# Patient Record
Sex: Female | Born: 1950
Health system: Southern US, Community
[De-identification: ages and names within clinical notes are randomized; demographics above are authoritative.]

## PROBLEM LIST (undated history)

## (undated) DIAGNOSIS — K219 Gastro-esophageal reflux disease without esophagitis: Secondary | ICD-10-CM

## (undated) DIAGNOSIS — F419 Anxiety disorder, unspecified: Secondary | ICD-10-CM

## (undated) DIAGNOSIS — L309 Dermatitis, unspecified: Secondary | ICD-10-CM

## (undated) DIAGNOSIS — D689 Coagulation defect, unspecified: Secondary | ICD-10-CM

## (undated) DIAGNOSIS — I10 Essential (primary) hypertension: Secondary | ICD-10-CM

## (undated) DIAGNOSIS — C439 Malignant melanoma of skin, unspecified: Secondary | ICD-10-CM

## (undated) DIAGNOSIS — Z9221 Personal history of antineoplastic chemotherapy: Secondary | ICD-10-CM

## (undated) DIAGNOSIS — E785 Hyperlipidemia, unspecified: Secondary | ICD-10-CM

## (undated) DIAGNOSIS — C8590 Non-Hodgkin lymphoma, unspecified, unspecified site: Secondary | ICD-10-CM

## (undated) DIAGNOSIS — F32A Depression, unspecified: Secondary | ICD-10-CM

## (undated) DIAGNOSIS — I2510 Atherosclerotic heart disease of native coronary artery without angina pectoris: Secondary | ICD-10-CM

## (undated) HISTORY — DX: Anxiety disorder, unspecified: F41.9

## (undated) HISTORY — DX: Hyperlipidemia, unspecified: E78.5

## (undated) HISTORY — DX: Atherosclerotic heart disease of native coronary artery without angina pectoris: I25.10

## (undated) HISTORY — DX: Malignant melanoma of skin, unspecified: C43.9

## (undated) HISTORY — DX: Non-Hodgkin lymphoma, unspecified, unspecified site: C85.90

## (undated) HISTORY — PX: HERNIA REPAIR: SHX51

## (undated) HISTORY — PX: OTHER SURGICAL HISTORY: SHX169

## (undated) HISTORY — DX: Coagulation defect, unspecified: D68.9

## (undated) HISTORY — PX: FACIAL COSMETIC SURGERY: SHX629

## (undated) HISTORY — DX: Dermatitis, unspecified: L30.9

## (undated) HISTORY — PX: CARDIAC CATHETERIZATION: SHX172

---

## 2001-10-05 HISTORY — PX: AUGMENTATION MAMMAPLASTY: SUR837

## 2004-04-30 ENCOUNTER — Other Ambulatory Visit: Payer: Self-pay

## 2004-07-16 ENCOUNTER — Ambulatory Visit: Payer: Self-pay | Admitting: Family Medicine

## 2005-07-22 ENCOUNTER — Ambulatory Visit: Payer: Self-pay | Admitting: Family Medicine

## 2006-07-28 ENCOUNTER — Ambulatory Visit: Payer: Self-pay | Admitting: Unknown Physician Specialty

## 2006-12-22 DIAGNOSIS — D039 Melanoma in situ, unspecified: Secondary | ICD-10-CM

## 2006-12-22 HISTORY — DX: Melanoma in situ, unspecified: D03.9

## 2006-12-31 ENCOUNTER — Ambulatory Visit: Payer: Self-pay | Admitting: Internal Medicine

## 2007-02-02 DIAGNOSIS — D239 Other benign neoplasm of skin, unspecified: Secondary | ICD-10-CM

## 2007-02-02 HISTORY — DX: Other benign neoplasm of skin, unspecified: D23.9

## 2007-08-03 ENCOUNTER — Ambulatory Visit: Payer: Self-pay | Admitting: Unknown Physician Specialty

## 2008-03-07 DIAGNOSIS — C4491 Basal cell carcinoma of skin, unspecified: Secondary | ICD-10-CM

## 2008-03-07 HISTORY — DX: Basal cell carcinoma of skin, unspecified: C44.91

## 2008-08-08 ENCOUNTER — Ambulatory Visit: Payer: Self-pay | Admitting: Unknown Physician Specialty

## 2009-10-02 ENCOUNTER — Ambulatory Visit: Payer: Self-pay | Admitting: Unknown Physician Specialty

## 2010-10-09 ENCOUNTER — Ambulatory Visit: Payer: Self-pay | Admitting: Unknown Physician Specialty

## 2011-10-14 ENCOUNTER — Ambulatory Visit: Payer: Self-pay | Admitting: Internal Medicine

## 2012-06-29 ENCOUNTER — Ambulatory Visit: Payer: Self-pay | Admitting: Gastroenterology

## 2012-06-29 HISTORY — PX: COLONOSCOPY: SHX174

## 2012-06-29 LAB — HM COLONOSCOPY

## 2012-11-16 ENCOUNTER — Ambulatory Visit: Payer: Self-pay | Admitting: Internal Medicine

## 2013-10-05 DIAGNOSIS — C8590 Non-Hodgkin lymphoma, unspecified, unspecified site: Secondary | ICD-10-CM

## 2013-10-05 HISTORY — DX: Non-Hodgkin lymphoma, unspecified, unspecified site: C85.90

## 2013-12-13 ENCOUNTER — Ambulatory Visit: Payer: Self-pay | Admitting: Internal Medicine

## 2014-06-12 ENCOUNTER — Ambulatory Visit: Payer: Self-pay | Admitting: Internal Medicine

## 2014-06-13 ENCOUNTER — Ambulatory Visit: Payer: Self-pay | Admitting: Oncology

## 2014-06-13 ENCOUNTER — Ambulatory Visit: Payer: Self-pay | Admitting: Internal Medicine

## 2014-06-19 ENCOUNTER — Ambulatory Visit: Payer: Self-pay | Admitting: Oncology

## 2014-07-02 ENCOUNTER — Ambulatory Visit: Payer: Self-pay | Admitting: Vascular Surgery

## 2014-07-03 LAB — CBC CANCER CENTER
Bands: 1 %
HCT: 38.7 % (ref 35.0–47.0)
HGB: 12.5 g/dL (ref 12.0–16.0)
LYMPHS PCT: 5 %
MCH: 27.8 pg (ref 26.0–34.0)
MCHC: 32.3 g/dL (ref 32.0–36.0)
MCV: 86 fL (ref 80–100)
Monocytes: 13 %
PLATELETS: 180 x10 3/mm (ref 150–440)
RBC: 4.51 10*6/uL (ref 3.80–5.20)
RDW: 14.4 % (ref 11.5–14.5)
SEGMENTED NEUTROPHILS: 79 %
VARIANT LYMPHOCYTE - H4-RLYMPH: 2 %
WBC: 12 x10 3/mm — AB (ref 3.6–11.0)

## 2014-07-04 LAB — COMPREHENSIVE METABOLIC PANEL
ALK PHOS: 70 U/L
Albumin: 3 g/dL — ABNORMAL LOW (ref 3.4–5.0)
Anion Gap: 9 (ref 7–16)
BUN: 11 mg/dL (ref 7–18)
Bilirubin,Total: 0.4 mg/dL (ref 0.2–1.0)
CALCIUM: 9.1 mg/dL (ref 8.5–10.1)
CO2: 26 mmol/L (ref 21–32)
Chloride: 101 mmol/L (ref 98–107)
Creatinine: 0.84 mg/dL (ref 0.60–1.30)
EGFR (African American): >60
EGFR (Non-African Amer.): >60
Glucose: 91 mg/dL (ref 65–99)
Osmolality: 271 (ref 275–301)
POTASSIUM: 3.7 mmol/L (ref 3.5–5.1)
SGOT(AST): 37 U/L (ref 15–37)
SGPT (ALT): 24 U/L
Sodium: 136 mmol/L (ref 136–145)
Total Protein: 6.7 g/dL (ref 6.4–8.2)

## 2014-07-05 ENCOUNTER — Ambulatory Visit: Payer: Self-pay | Admitting: Oncology

## 2014-07-11 LAB — CBC CANCER CENTER
Basophil #: 0 x10 3/mm (ref 0.0–0.1)
Basophil %: 0.2 %
Eosinophil #: 0 x10 3/mm (ref 0.0–0.7)
Eosinophil %: 5.4 %
HCT: 35.9 % (ref 35.0–47.0)
HGB: 11.9 g/dL — AB (ref 12.0–16.0)
LYMPHS ABS: 0.5 x10 3/mm — AB (ref 1.0–3.6)
Lymphocyte %: 68 %
MCH: 28.2 pg (ref 26.0–34.0)
MCHC: 33.2 g/dL (ref 32.0–36.0)
MCV: 85 fL (ref 80–100)
MONOS PCT: 21.7 %
Monocyte #: 0.1 x10 3/mm — ABNORMAL LOW (ref 0.2–0.9)
NEUTROS ABS: 0 x10 3/mm — AB (ref 1.4–6.5)
Neutrophil %: 4.7 %
Platelet: 140 x10 3/mm — ABNORMAL LOW (ref 150–440)
RBC: 4.22 10*6/uL (ref 3.80–5.20)
RDW: 14.4 % (ref 11.5–14.5)
WBC: 0.7 x10 3/mm — AB (ref 3.6–11.0)

## 2014-07-16 LAB — PATHOLOGY REPORT

## 2014-07-18 LAB — CBC CANCER CENTER
BASOS ABS: 0.1 x10 3/mm (ref 0.0–0.1)
Basophil %: 0.5 %
Eosinophil #: 0 x10 3/mm (ref 0.0–0.7)
Eosinophil %: 0.1 %
HCT: 33.5 % — AB (ref 35.0–47.0)
HGB: 11.1 g/dL — ABNORMAL LOW (ref 12.0–16.0)
LYMPHS PCT: 7.7 %
Lymphocyte #: 1.2 x10 3/mm (ref 1.0–3.6)
MCH: 28.4 pg (ref 26.0–34.0)
MCHC: 33.3 g/dL (ref 32.0–36.0)
MCV: 85 fL (ref 80–100)
MONO ABS: 1.4 x10 3/mm — AB (ref 0.2–0.9)
Monocyte %: 9.2 %
NEUTROS ABS: 12.5 x10 3/mm — AB (ref 1.4–6.5)
NEUTROS PCT: 82.5 %
Platelet: 179 x10 3/mm (ref 150–440)
RBC: 3.93 10*6/uL (ref 3.80–5.20)
RDW: 15.2 % — ABNORMAL HIGH (ref 11.5–14.5)
WBC: 15.2 x10 3/mm — ABNORMAL HIGH (ref 3.6–11.0)

## 2014-07-24 LAB — CBC CANCER CENTER
Basophil #: 0.1 x10 3/mm (ref 0.0–0.1)
Basophil %: 1 %
Eosinophil #: 0 x10 3/mm (ref 0.0–0.7)
Eosinophil %: 0.1 %
HCT: 34.4 % — ABNORMAL LOW (ref 35.0–47.0)
HGB: 11.1 g/dL — AB (ref 12.0–16.0)
Lymphocyte #: 0.7 x10 3/mm — ABNORMAL LOW (ref 1.0–3.6)
Lymphocyte %: 6.5 %
MCH: 27.8 pg (ref 26.0–34.0)
MCHC: 32.2 g/dL (ref 32.0–36.0)
MCV: 86 fL (ref 80–100)
MONO ABS: 1 x10 3/mm — AB (ref 0.2–0.9)
MONOS PCT: 8.5 %
Neutrophil #: 9.6 x10 3/mm — ABNORMAL HIGH (ref 1.4–6.5)
Neutrophil %: 83.9 %
PLATELETS: 185 x10 3/mm (ref 150–440)
RBC: 3.98 10*6/uL (ref 3.80–5.20)
RDW: 16 % — AB (ref 11.5–14.5)
WBC: 11.4 x10 3/mm — ABNORMAL HIGH (ref 3.6–11.0)

## 2014-07-24 LAB — COMPREHENSIVE METABOLIC PANEL
ALK PHOS: 61 U/L
ALT: 17 U/L
Albumin: 2.9 g/dL — ABNORMAL LOW (ref 3.4–5.0)
Anion Gap: 6 — ABNORMAL LOW (ref 7–16)
BUN: 13 mg/dL (ref 7–18)
Bilirubin,Total: 0.3 mg/dL (ref 0.2–1.0)
CALCIUM: 8.1 mg/dL — AB (ref 8.5–10.1)
CO2: 26 mmol/L (ref 21–32)
Chloride: 106 mmol/L (ref 98–107)
Creatinine: 0.75 mg/dL (ref 0.60–1.30)
EGFR (African American): >60
EGFR (Non-African Amer.): >60
Glucose: 76 mg/dL (ref 65–99)
Osmolality: 275 (ref 275–301)
POTASSIUM: 4 mmol/L (ref 3.5–5.1)
SGOT(AST): 13 U/L — ABNORMAL LOW (ref 15–37)
SODIUM: 138 mmol/L (ref 136–145)
Total Protein: 6.6 g/dL (ref 6.4–8.2)

## 2014-08-01 LAB — CBC CANCER CENTER
BASOS PCT: 0.5 %
Basophil #: 0 x10 3/mm (ref 0.0–0.1)
EOS PCT: 1 %
Eosinophil #: 0 x10 3/mm (ref 0.0–0.7)
HCT: 30.9 % — AB (ref 35.0–47.0)
HGB: 10.4 g/dL — ABNORMAL LOW (ref 12.0–16.0)
LYMPHS PCT: 31.6 %
Lymphocyte #: 0.6 x10 3/mm — ABNORMAL LOW (ref 1.0–3.6)
MCH: 28.6 pg (ref 26.0–34.0)
MCHC: 33.7 g/dL (ref 32.0–36.0)
MCV: 85 fL (ref 80–100)
Monocyte #: 0.7 x10 3/mm (ref 0.2–0.9)
Monocyte %: 34.9 %
NEUTROS ABS: 0.6 x10 3/mm — AB (ref 1.4–6.5)
Neutrophil %: 32 %
Platelet: 163 x10 3/mm (ref 150–440)
RBC: 3.64 10*6/uL — ABNORMAL LOW (ref 3.80–5.20)
RDW: 16.4 % — ABNORMAL HIGH (ref 11.5–14.5)
WBC: 1.9 x10 3/mm — CL (ref 3.6–11.0)

## 2014-08-05 ENCOUNTER — Ambulatory Visit: Payer: Self-pay | Admitting: Oncology

## 2014-08-07 LAB — CBC CANCER CENTER
BASOS PCT: 0.5 %
Basophil #: 0.1 x10 3/mm (ref 0.0–0.1)
Eosinophil #: 0 x10 3/mm (ref 0.0–0.7)
Eosinophil %: 0.1 %
HCT: 33.8 % — ABNORMAL LOW (ref 35.0–47.0)
HGB: 11 g/dL — AB (ref 12.0–16.0)
LYMPHS PCT: 6.1 %
Lymphocyte #: 1 x10 3/mm (ref 1.0–3.6)
MCH: 27.9 pg (ref 26.0–34.0)
MCHC: 32.5 g/dL (ref 32.0–36.0)
MCV: 86 fL (ref 80–100)
Monocyte #: 1.5 x10 3/mm — ABNORMAL HIGH (ref 0.2–0.9)
Monocyte %: 9.2 %
Neutrophil #: 13.3 x10 3/mm — ABNORMAL HIGH (ref 1.4–6.5)
Neutrophil %: 84.1 %
Platelet: 166 x10 3/mm (ref 150–440)
RBC: 3.94 10*6/uL (ref 3.80–5.20)
RDW: 17.2 % — ABNORMAL HIGH (ref 11.5–14.5)
WBC: 15.8 x10 3/mm — ABNORMAL HIGH (ref 3.6–11.0)

## 2014-08-14 LAB — CBC CANCER CENTER
BASOS PCT: 1.1 %
Basophil #: 0.1 x10 3/mm (ref 0.0–0.1)
EOS ABS: 0 x10 3/mm (ref 0.0–0.7)
EOS PCT: 0.1 %
HCT: 33.9 % — ABNORMAL LOW (ref 35.0–47.0)
HGB: 11 g/dL — AB (ref 12.0–16.0)
Lymphocyte #: 0.7 x10 3/mm — ABNORMAL LOW (ref 1.0–3.6)
Lymphocyte %: 6.2 %
MCH: 28.1 pg (ref 26.0–34.0)
MCHC: 32.6 g/dL (ref 32.0–36.0)
MCV: 86 fL (ref 80–100)
MONO ABS: 1.4 x10 3/mm — AB (ref 0.2–0.9)
Monocyte %: 11.9 %
Neutrophil #: 9.3 x10 3/mm — ABNORMAL HIGH (ref 1.4–6.5)
Neutrophil %: 80.7 %
PLATELETS: 194 x10 3/mm (ref 150–440)
RBC: 3.93 10*6/uL (ref 3.80–5.20)
RDW: 18.6 % — ABNORMAL HIGH (ref 11.5–14.5)
WBC: 11.5 x10 3/mm — ABNORMAL HIGH (ref 3.6–11.0)

## 2014-08-14 LAB — COMPREHENSIVE METABOLIC PANEL
ANION GAP: 9 (ref 7–16)
Albumin: 3.1 g/dL — ABNORMAL LOW (ref 3.4–5.0)
Alkaline Phosphatase: 58 U/L
BUN: 13 mg/dL (ref 7–18)
Bilirubin,Total: 0.3 mg/dL (ref 0.2–1.0)
CHLORIDE: 103 mmol/L (ref 98–107)
CREATININE: 0.79 mg/dL (ref 0.60–1.30)
Calcium, Total: 8.6 mg/dL (ref 8.5–10.1)
Co2: 26 mmol/L (ref 21–32)
EGFR (African American): >60
EGFR (Non-African Amer.): >60
Glucose: 85 mg/dL (ref 65–99)
Osmolality: 275 (ref 275–301)
POTASSIUM: 3.7 mmol/L (ref 3.5–5.1)
SGOT(AST): 13 U/L — ABNORMAL LOW (ref 15–37)
SGPT (ALT): 19 U/L
Sodium: 138 mmol/L (ref 136–145)
Total Protein: 6.6 g/dL (ref 6.4–8.2)

## 2014-08-21 LAB — CBC CANCER CENTER
Basophil #: 0 x10 3/mm (ref 0.0–0.1)
Basophil %: 1 %
EOS PCT: 1.6 %
Eosinophil #: 0 x10 3/mm (ref 0.0–0.7)
HCT: 31.2 % — ABNORMAL LOW (ref 35.0–47.0)
HGB: 10.2 g/dL — ABNORMAL LOW (ref 12.0–16.0)
LYMPHS ABS: 0.4 x10 3/mm — AB (ref 1.0–3.6)
Lymphocyte %: 15.4 %
MCH: 28.2 pg (ref 26.0–34.0)
MCHC: 32.6 g/dL (ref 32.0–36.0)
MCV: 86 fL (ref 80–100)
Monocyte #: 0.3 x10 3/mm (ref 0.2–0.9)
Monocyte %: 13.9 %
NEUTROS PCT: 68.1 %
Neutrophil #: 1.6 x10 3/mm (ref 1.4–6.5)
PLATELETS: 119 x10 3/mm — AB (ref 150–440)
RBC: 3.61 10*6/uL — ABNORMAL LOW (ref 3.80–5.20)
RDW: 18.3 % — ABNORMAL HIGH (ref 11.5–14.5)
WBC: 2.4 x10 3/mm — ABNORMAL LOW (ref 3.6–11.0)

## 2014-08-28 LAB — CBC CANCER CENTER
Basophil #: 0.1 x10 3/mm (ref 0.0–0.1)
Basophil %: 0.9 %
EOS ABS: 0 x10 3/mm (ref 0.0–0.7)
Eosinophil %: 0 %
HCT: 34.7 % — ABNORMAL LOW (ref 35.0–47.0)
HGB: 11.4 g/dL — ABNORMAL LOW (ref 12.0–16.0)
Lymphocyte #: 0.8 x10 3/mm — ABNORMAL LOW (ref 1.0–3.6)
Lymphocyte %: 5.6 %
MCH: 28.4 pg (ref 26.0–34.0)
MCHC: 32.7 g/dL (ref 32.0–36.0)
MCV: 87 fL (ref 80–100)
MONO ABS: 1.3 x10 3/mm — AB (ref 0.2–0.9)
MONOS PCT: 9.7 %
Neutrophil #: 11.2 x10 3/mm — ABNORMAL HIGH (ref 1.4–6.5)
Neutrophil %: 83.8 %
Platelet: 177 x10 3/mm (ref 150–440)
RBC: 3.99 10*6/uL (ref 3.80–5.20)
RDW: 18.9 % — ABNORMAL HIGH (ref 11.5–14.5)
WBC: 13.4 x10 3/mm — ABNORMAL HIGH (ref 3.6–11.0)

## 2014-09-04 ENCOUNTER — Ambulatory Visit: Payer: Self-pay | Admitting: Oncology

## 2014-09-04 LAB — COMPREHENSIVE METABOLIC PANEL
ANION GAP: 9 (ref 7–16)
AST: 12 U/L — AB (ref 15–37)
Albumin: 3 g/dL — ABNORMAL LOW (ref 3.4–5.0)
Alkaline Phosphatase: 54 U/L
BILIRUBIN TOTAL: 0.3 mg/dL (ref 0.2–1.0)
BUN: 13 mg/dL (ref 7–18)
CO2: 26 mmol/L (ref 21–32)
Calcium, Total: 8.1 mg/dL — ABNORMAL LOW (ref 8.5–10.1)
Chloride: 104 mmol/L (ref 98–107)
Creatinine: 0.72 mg/dL (ref 0.60–1.30)
EGFR (African American): >60
Glucose: 95 mg/dL (ref 65–99)
Osmolality: 277 (ref 275–301)
Potassium: 3.5 mmol/L (ref 3.5–5.1)
SGPT (ALT): 17 U/L
Sodium: 139 mmol/L (ref 136–145)
Total Protein: 6.2 g/dL — ABNORMAL LOW (ref 6.4–8.2)

## 2014-09-04 LAB — CBC CANCER CENTER
Basophil #: 0.1 x10 3/mm (ref 0.0–0.1)
Basophil %: 0.8 %
EOS ABS: 0 x10 3/mm (ref 0.0–0.7)
Eosinophil %: 0 %
HCT: 32.1 % — ABNORMAL LOW (ref 35.0–47.0)
HGB: 10.8 g/dL — ABNORMAL LOW (ref 12.0–16.0)
Lymphocyte #: 0.6 x10 3/mm — ABNORMAL LOW (ref 1.0–3.6)
Lymphocyte %: 5.4 %
MCH: 29.2 pg (ref 26.0–34.0)
MCHC: 33.8 g/dL (ref 32.0–36.0)
MCV: 86 fL (ref 80–100)
MONOS PCT: 10.2 %
Monocyte #: 1.1 x10 3/mm — ABNORMAL HIGH (ref 0.2–0.9)
NEUTROS ABS: 9.1 x10 3/mm — AB (ref 1.4–6.5)
Neutrophil %: 83.6 %
Platelet: 163 x10 3/mm (ref 150–440)
RBC: 3.71 10*6/uL — AB (ref 3.80–5.20)
RDW: 19.2 % — ABNORMAL HIGH (ref 11.5–14.5)
WBC: 10.9 x10 3/mm (ref 3.6–11.0)

## 2014-09-11 LAB — CBC CANCER CENTER
Basophil #: 0 x10 3/mm (ref 0.0–0.1)
Basophil %: 0.5 %
Eosinophil #: 0 x10 3/mm (ref 0.0–0.7)
Eosinophil %: 1.4 %
HCT: 30.9 % — AB (ref 35.0–47.0)
HGB: 10.1 g/dL — AB (ref 12.0–16.0)
Lymphocyte #: 0.3 x10 3/mm — ABNORMAL LOW (ref 1.0–3.6)
Lymphocyte %: 12 %
MCH: 28.8 pg (ref 26.0–34.0)
MCHC: 32.9 g/dL (ref 32.0–36.0)
MCV: 88 fL (ref 80–100)
MONOS PCT: 13.6 %
Monocyte #: 0.4 x10 3/mm (ref 0.2–0.9)
NEUTROS ABS: 2 x10 3/mm (ref 1.4–6.5)
Neutrophil %: 72.5 %
Platelet: 97 x10 3/mm — ABNORMAL LOW (ref 150–440)
RBC: 3.53 10*6/uL — AB (ref 3.80–5.20)
RDW: 19.3 % — AB (ref 11.5–14.5)
WBC: 2.7 x10 3/mm — AB (ref 3.6–11.0)

## 2014-09-18 LAB — CBC CANCER CENTER
Basophil #: 0.1 x10 3/mm (ref 0.0–0.1)
Basophil %: 0.5 %
EOS ABS: 0 x10 3/mm (ref 0.0–0.7)
Eosinophil %: 0 %
HCT: 33.2 % — ABNORMAL LOW (ref 35.0–47.0)
HGB: 11 g/dL — AB (ref 12.0–16.0)
LYMPHS PCT: 6.7 %
Lymphocyte #: 0.9 x10 3/mm — ABNORMAL LOW (ref 1.0–3.6)
MCH: 28.9 pg (ref 26.0–34.0)
MCHC: 33 g/dL (ref 32.0–36.0)
MCV: 88 fL (ref 80–100)
MONO ABS: 1.4 x10 3/mm — AB (ref 0.2–0.9)
Monocyte %: 11.2 %
Neutrophil #: 10.4 x10 3/mm — ABNORMAL HIGH (ref 1.4–6.5)
Neutrophil %: 81.6 %
PLATELETS: 154 x10 3/mm (ref 150–440)
RBC: 3.79 10*6/uL — ABNORMAL LOW (ref 3.80–5.20)
RDW: 18.9 % — ABNORMAL HIGH (ref 11.5–14.5)
WBC: 12.7 x10 3/mm — AB (ref 3.6–11.0)

## 2014-09-24 ENCOUNTER — Ambulatory Visit: Payer: Self-pay | Admitting: Oncology

## 2014-09-25 LAB — CBC CANCER CENTER
BASOS ABS: 0.1 x10 3/mm (ref 0.0–0.1)
Basophil %: 0.8 %
EOS PCT: 0.1 %
Eosinophil #: 0 x10 3/mm (ref 0.0–0.7)
HCT: 32.3 % — AB (ref 35.0–47.0)
HGB: 10.8 g/dL — ABNORMAL LOW (ref 12.0–16.0)
LYMPHS ABS: 0.8 x10 3/mm — AB (ref 1.0–3.6)
LYMPHS PCT: 7.6 %
MCH: 29.1 pg (ref 26.0–34.0)
MCHC: 33.5 g/dL (ref 32.0–36.0)
MCV: 87 fL (ref 80–100)
MONO ABS: 1.2 x10 3/mm — AB (ref 0.2–0.9)
Monocyte %: 10.9 %
NEUTROS PCT: 80.6 %
Neutrophil #: 8.5 x10 3/mm — ABNORMAL HIGH (ref 1.4–6.5)
PLATELETS: 172 x10 3/mm (ref 150–440)
RBC: 3.72 10*6/uL — AB (ref 3.80–5.20)
RDW: 18.8 % — ABNORMAL HIGH (ref 11.5–14.5)
WBC: 10.5 x10 3/mm (ref 3.6–11.0)

## 2014-09-25 LAB — COMPREHENSIVE METABOLIC PANEL
ALBUMIN: 3.1 g/dL — AB (ref 3.4–5.0)
AST: 11 U/L — AB (ref 15–37)
Alkaline Phosphatase: 56 U/L
Anion Gap: 10 (ref 7–16)
BUN: 11 mg/dL (ref 7–18)
Bilirubin,Total: 0.3 mg/dL (ref 0.2–1.0)
CHLORIDE: 104 mmol/L (ref 98–107)
CO2: 25 mmol/L (ref 21–32)
CREATININE: 0.75 mg/dL (ref 0.60–1.30)
Calcium, Total: 7.8 mg/dL — ABNORMAL LOW (ref 8.5–10.1)
Glucose: 106 mg/dL — ABNORMAL HIGH (ref 65–99)
Osmolality: 277 (ref 275–301)
Potassium: 3.5 mmol/L (ref 3.5–5.1)
SGPT (ALT): 16 U/L
Sodium: 139 mmol/L (ref 136–145)
Total Protein: 6.2 g/dL — ABNORMAL LOW (ref 6.4–8.2)

## 2014-09-25 LAB — LACTATE DEHYDROGENASE: LDH: 151 U/L (ref 81–246)

## 2014-10-03 LAB — CBC CANCER CENTER
BASOS PCT: 1.9 %
Basophil #: 0 x10 3/mm (ref 0.0–0.1)
EOS ABS: 0 x10 3/mm (ref 0.0–0.7)
Eosinophil %: 1.7 %
HCT: 32 % — ABNORMAL LOW (ref 35.0–47.0)
HGB: 10.6 g/dL — ABNORMAL LOW (ref 12.0–16.0)
LYMPHS ABS: 0.6 x10 3/mm — AB (ref 1.0–3.6)
Lymphocyte %: 32.2 %
MCH: 29.2 pg (ref 26.0–34.0)
MCHC: 33.3 g/dL (ref 32.0–36.0)
MCV: 88 fL (ref 80–100)
Monocyte #: 0.8 x10 3/mm (ref 0.2–0.9)
Monocyte %: 43.9 %
NEUTROS ABS: 0.4 x10 3/mm — AB (ref 1.4–6.5)
Neutrophil %: 20.3 %
PLATELETS: 112 x10 3/mm — AB (ref 150–440)
RBC: 3.63 10*6/uL — AB (ref 3.80–5.20)
RDW: 17.9 % — ABNORMAL HIGH (ref 11.5–14.5)
WBC: 1.8 x10 3/mm — AB (ref 3.6–11.0)

## 2014-10-05 ENCOUNTER — Ambulatory Visit: Payer: Self-pay | Admitting: Oncology

## 2014-10-09 LAB — CBC CANCER CENTER
BASOS ABS: 0.1 x10 3/mm (ref 0.0–0.1)
Basophil %: 0.9 %
EOS ABS: 0 x10 3/mm (ref 0.0–0.7)
Eosinophil %: 0.2 %
HCT: 34 % — ABNORMAL LOW (ref 35.0–47.0)
HGB: 11.4 g/dL — ABNORMAL LOW (ref 12.0–16.0)
Lymphocyte #: 0.9 x10 3/mm — ABNORMAL LOW (ref 1.0–3.6)
Lymphocyte %: 7.7 %
MCH: 29.5 pg (ref 26.0–34.0)
MCHC: 33.5 g/dL (ref 32.0–36.0)
MCV: 88 fL (ref 80–100)
Monocyte #: 1.4 x10 3/mm — ABNORMAL HIGH (ref 0.2–0.9)
Monocyte %: 12.3 %
NEUTROS PCT: 78.9 %
Neutrophil #: 9.1 x10 3/mm — ABNORMAL HIGH (ref 1.4–6.5)
Platelet: 180 x10 3/mm (ref 150–440)
RBC: 3.87 10*6/uL (ref 3.80–5.20)
RDW: 17.5 % — AB (ref 11.5–14.5)
WBC: 11.6 x10 3/mm — ABNORMAL HIGH (ref 3.6–11.0)

## 2014-10-16 LAB — MAGNESIUM: Magnesium: 1.7 mg/dL — ABNORMAL LOW

## 2014-10-16 LAB — CBC CANCER CENTER
Basophil #: 0.1 x10 3/mm (ref 0.0–0.1)
Basophil %: 0.8 %
Eosinophil #: 0 x10 3/mm (ref 0.0–0.7)
Eosinophil %: 0.2 %
HCT: 32.1 % — AB (ref 35.0–47.0)
HGB: 10.8 g/dL — ABNORMAL LOW (ref 12.0–16.0)
Lymphocyte #: 0.9 x10 3/mm — ABNORMAL LOW (ref 1.0–3.6)
Lymphocyte %: 10.9 %
MCH: 29.9 pg (ref 26.0–34.0)
MCHC: 33.6 g/dL (ref 32.0–36.0)
MCV: 89 fL (ref 80–100)
Monocyte #: 1.2 x10 3/mm — ABNORMAL HIGH (ref 0.2–0.9)
Monocyte %: 14.7 %
NEUTROS ABS: 5.8 x10 3/mm (ref 1.4–6.5)
Neutrophil %: 73.4 %
Platelet: 163 x10 3/mm (ref 150–440)
RBC: 3.61 10*6/uL — ABNORMAL LOW (ref 3.80–5.20)
RDW: 17.4 % — ABNORMAL HIGH (ref 11.5–14.5)
WBC: 7.9 x10 3/mm (ref 3.6–11.0)

## 2014-10-16 LAB — COMPREHENSIVE METABOLIC PANEL
ANION GAP: 7 (ref 7–16)
Albumin: 3.1 g/dL — ABNORMAL LOW (ref 3.4–5.0)
Alkaline Phosphatase: 47 U/L
BILIRUBIN TOTAL: 0.3 mg/dL (ref 0.2–1.0)
BUN: 11 mg/dL (ref 7–18)
CHLORIDE: 107 mmol/L (ref 98–107)
CREATININE: 0.85 mg/dL (ref 0.60–1.30)
Calcium, Total: 8.1 mg/dL — ABNORMAL LOW (ref 8.5–10.1)
Co2: 25 mmol/L (ref 21–32)
EGFR (Non-African Amer.): >60
GLUCOSE: 80 mg/dL (ref 65–99)
Osmolality: 276 (ref 275–301)
POTASSIUM: 3.7 mmol/L (ref 3.5–5.1)
SGOT(AST): 23 U/L (ref 15–37)
SGPT (ALT): 18 U/L
SODIUM: 139 mmol/L (ref 136–145)
TOTAL PROTEIN: 6.4 g/dL (ref 6.4–8.2)

## 2014-10-16 LAB — CREATININE, SERUM: Creatine, Serum: 0.85

## 2014-10-23 LAB — CBC CANCER CENTER
BASOS ABS: 0 x10 3/mm (ref 0.0–0.1)
Basophil %: 0.2 %
EOS PCT: 2.2 %
Eosinophil #: 0 x10 3/mm (ref 0.0–0.7)
HCT: 32.7 % — ABNORMAL LOW (ref 35.0–47.0)
HGB: 11.1 g/dL — ABNORMAL LOW (ref 12.0–16.0)
LYMPHS ABS: 0.3 x10 3/mm — AB (ref 1.0–3.6)
Lymphocyte %: 15.5 %
MCH: 30 pg (ref 26.0–34.0)
MCHC: 34 g/dL (ref 32.0–36.0)
MCV: 88 fL (ref 80–100)
MONO ABS: 0.2 x10 3/mm (ref 0.2–0.9)
Monocyte %: 10 %
NEUTROS PCT: 72.1 %
Neutrophil #: 1.5 x10 3/mm (ref 1.4–6.5)
PLATELETS: 107 x10 3/mm — AB (ref 150–440)
RBC: 3.71 10*6/uL — AB (ref 3.80–5.20)
RDW: 16.5 % — AB (ref 11.5–14.5)
WBC: 2.1 x10 3/mm — ABNORMAL LOW (ref 3.6–11.0)

## 2014-10-30 LAB — CBC CANCER CENTER
Basophil #: 0.1 x10 3/mm (ref 0.0–0.1)
Basophil %: 1.1 %
EOS ABS: 0 x10 3/mm (ref 0.0–0.7)
Eosinophil %: 0.3 %
HCT: 32.8 % — ABNORMAL LOW (ref 35.0–47.0)
HGB: 11 g/dL — AB (ref 12.0–16.0)
Lymphocyte #: 0.7 x10 3/mm — ABNORMAL LOW (ref 1.0–3.6)
Lymphocyte %: 6.4 %
MCH: 29.6 pg (ref 26.0–34.0)
MCHC: 33.4 g/dL (ref 32.0–36.0)
MCV: 89 fL (ref 80–100)
MONO ABS: 1.3 x10 3/mm — AB (ref 0.2–0.9)
Monocyte %: 12.8 %
Neutrophil #: 8.1 x10 3/mm — ABNORMAL HIGH (ref 1.4–6.5)
Neutrophil %: 79.4 %
PLATELETS: 150 x10 3/mm (ref 150–440)
RBC: 3.71 10*6/uL — AB (ref 3.80–5.20)
RDW: 16.6 % — ABNORMAL HIGH (ref 11.5–14.5)
WBC: 10.2 x10 3/mm (ref 3.6–11.0)

## 2014-10-31 ENCOUNTER — Ambulatory Visit: Payer: Self-pay | Admitting: Oncology

## 2014-11-05 ENCOUNTER — Ambulatory Visit: Payer: Self-pay | Admitting: Oncology

## 2014-11-06 LAB — CBC CANCER CENTER
BASOS PCT: 1.4 %
Basophil #: 0.1 x10 3/mm (ref 0.0–0.1)
EOS ABS: 0.1 x10 3/mm (ref 0.0–0.7)
Eosinophil %: 1.5 %
HCT: 33.4 % — AB (ref 35.0–47.0)
HGB: 11.4 g/dL — ABNORMAL LOW (ref 12.0–16.0)
Lymphocyte #: 1 x10 3/mm (ref 1.0–3.6)
Lymphocyte %: 11.6 %
MCH: 29.4 pg (ref 26.0–34.0)
MCHC: 34.3 g/dL (ref 32.0–36.0)
MCV: 86 fL (ref 80–100)
MONOS PCT: 18.3 %
Monocyte #: 1.5 x10 3/mm — ABNORMAL HIGH (ref 0.2–0.9)
NEUTROS ABS: 5.7 x10 3/mm (ref 1.4–6.5)
Neutrophil %: 67.2 %
Platelet: 194 x10 3/mm (ref 150–440)
RBC: 3.88 10*6/uL (ref 3.80–5.20)
RDW: 16.7 % — ABNORMAL HIGH (ref 11.5–14.5)
WBC: 8.4 x10 3/mm (ref 3.6–11.0)

## 2014-12-04 ENCOUNTER — Ambulatory Visit
Admit: 2014-12-04 | Disposition: A | Discharge status: Home / Self Care | Payer: Self-pay | Attending: Oncology | Admitting: Oncology

## 2015-01-04 ENCOUNTER — Ambulatory Visit
Admit: 2015-01-04 | Disposition: A | Discharge status: Home / Self Care | Payer: Self-pay | Attending: Oncology | Admitting: Oncology

## 2015-01-26 NOTE — Op Note (Signed)
PATIENT NAME:  JORA, GALLUZZO MR#:  [id number] DATE OF BIRTH:  [birth year]  DATE OF PROCEDURE:  07/02/2014  PREOPERATIVE DIAGNOSIS: Melanoma.  POSTOPERATIVE DIAGNOSIS: Melanoma.  PROCEDURES:  1.  Ultrasound guidance for vascular access, right internal jugular vein.  2.  Fluoroscopic guidance for placement of catheter.  3.  Placement of CT compatible Port-A-Cath, right internal jugular vein.   SURGEON: Dr. Lucky Cowboy.   ANESTHESIA:  Local with moderate conscious sedation.   FLUOROSCOPY TIME:  Less than 1 minute.   CONTRAST:  Zero.   ESTIMATED BLOOD LOSS: Minimal.   INDICATION FOR PROCEDURE: A 64 year old with melanoma. She needs a Port-A-Cath for chemotherapy and durable venous access. Risks and benefits were discussed. Informed consent was obtained.   DESCRIPTION OF THE PROCEDURE:  The patient was brought to the vascular and interventional radiology suite. The right neck and chest were sterilely prepped and draped, and a sterile surgical field was created. Ultrasound was used to help visualize a patent right internal jugular vein. This was then accessed under direct ultrasound guidance without difficulty with a Seldinger needle and a permanent image was recorded. A J-wire was placed. After skin nick and dilatation, the peel-away sheath was then placed over the wire. I then anesthetized an area under the clavicle approximately 2 fingerbreadths. A transverse incision was created and an inferior pocket was created with electrocautery and blunt dissection. The port was then brought onto the field, placed into the pocket and secured to the chest wall with 2 Prolene sutures. The catheter was connected to the port and tunneled from the subclavicular incision to the access site. Fluoroscopic guidance was used to cut the catheter to an appropriate length. The catheter was then placed through the peel-away sheath and the peel-away sheath was removed. The catheter tip was parked in excellent location in the  cavoatrial junction. The pocket was then irrigated with antibiotic-impregnated saline and the wound was closed with a running 3-0 Vicryl and a 4-0 Monocryl. The access incision was closed with a single 4-0 Monocryl. The Huber needle was used to withdraw blood and flush the port with heparinized saline. Dermabond was then placed as a dressing. The patient tolerated the procedure well and was taken to the recovery room in stable condition.    ____________________________ Algernon Huxley, MD jsd:lt D: 07/02/2014 14:03:01 ET T: 07/02/2014 16:03:26 ET JOB#: [id number]  cc: Algernon Huxley, MD, <Dictator> Algernon Huxley MD ELECTRONICALLY SIGNED 07/17/2014 10:20

## 2015-01-30 ENCOUNTER — Other Ambulatory Visit: Payer: Self-pay | Admitting: *Deleted

## 2015-01-30 DIAGNOSIS — C8590 Non-Hodgkin lymphoma, unspecified, unspecified site: Secondary | ICD-10-CM

## 2015-02-05 ENCOUNTER — Inpatient Hospital Stay: Payer: 59 | Attending: Oncology

## 2015-02-05 ENCOUNTER — Inpatient Hospital Stay (HOSPITAL_BASED_OUTPATIENT_CLINIC_OR_DEPARTMENT_OTHER): Payer: 59 | Admitting: Oncology

## 2015-02-05 ENCOUNTER — Inpatient Hospital Stay: Payer: 59

## 2015-02-05 ENCOUNTER — Encounter: Payer: Self-pay | Admitting: Oncology

## 2015-02-05 VITALS — BP 146/82 | HR 72 | Temp 96.1°F | Wt 194.2 lb

## 2015-02-05 DIAGNOSIS — Z79899 Other long term (current) drug therapy: Secondary | ICD-10-CM | POA: Diagnosis not present

## 2015-02-05 DIAGNOSIS — C8299 Follicular lymphoma, unspecified, extranodal and solid organ sites: Secondary | ICD-10-CM

## 2015-02-05 DIAGNOSIS — I2510 Atherosclerotic heart disease of native coronary artery without angina pectoris: Secondary | ICD-10-CM

## 2015-02-05 DIAGNOSIS — R590 Localized enlarged lymph nodes: Secondary | ICD-10-CM

## 2015-02-05 DIAGNOSIS — L570 Actinic keratosis: Secondary | ICD-10-CM | POA: Diagnosis not present

## 2015-02-05 DIAGNOSIS — F419 Anxiety disorder, unspecified: Secondary | ICD-10-CM | POA: Insufficient documentation

## 2015-02-05 DIAGNOSIS — Z7902 Long term (current) use of antithrombotics/antiplatelets: Secondary | ICD-10-CM | POA: Diagnosis not present

## 2015-02-05 DIAGNOSIS — C8330 Diffuse large B-cell lymphoma, unspecified site: Secondary | ICD-10-CM

## 2015-02-05 DIAGNOSIS — C8338 Diffuse large B-cell lymphoma, lymph nodes of multiple sites: Secondary | ICD-10-CM | POA: Diagnosis not present

## 2015-02-05 DIAGNOSIS — R1310 Dysphagia, unspecified: Secondary | ICD-10-CM

## 2015-02-05 DIAGNOSIS — H9319 Tinnitus, unspecified ear: Secondary | ICD-10-CM

## 2015-02-05 DIAGNOSIS — E785 Hyperlipidemia, unspecified: Secondary | ICD-10-CM | POA: Diagnosis not present

## 2015-02-05 DIAGNOSIS — C4359 Malignant melanoma of other part of trunk: Secondary | ICD-10-CM | POA: Insufficient documentation

## 2015-02-05 DIAGNOSIS — Z85828 Personal history of other malignant neoplasm of skin: Secondary | ICD-10-CM

## 2015-02-05 DIAGNOSIS — C8334 Diffuse large B-cell lymphoma, lymph nodes of axilla and upper limb: Secondary | ICD-10-CM | POA: Diagnosis not present

## 2015-02-05 DIAGNOSIS — Z9221 Personal history of antineoplastic chemotherapy: Secondary | ICD-10-CM

## 2015-02-05 DIAGNOSIS — Z7982 Long term (current) use of aspirin: Secondary | ICD-10-CM

## 2015-02-05 DIAGNOSIS — Z85820 Personal history of malignant melanoma of skin: Secondary | ICD-10-CM | POA: Diagnosis not present

## 2015-02-05 DIAGNOSIS — C8590 Non-Hodgkin lymphoma, unspecified, unspecified site: Secondary | ICD-10-CM

## 2015-02-05 LAB — COMPREHENSIVE METABOLIC PANEL
ALBUMIN: 3.9 g/dL (ref 3.5–5.0)
ALT: 27 U/L (ref 14–54)
AST: 27 U/L (ref 15–41)
Alkaline Phosphatase: 44 U/L (ref 38–126)
Anion gap: 4 — ABNORMAL LOW (ref 5–15)
BUN: 15 mg/dL (ref 6–20)
CHLORIDE: 105 mmol/L (ref 101–111)
CO2: 26 mmol/L (ref 22–32)
CREATININE: 0.8 mg/dL (ref 0.44–1.00)
Calcium: 8.5 mg/dL — ABNORMAL LOW (ref 8.9–10.3)
GFR calc Af Amer: >60 mL/min (ref >60)
GFR calc non Af Amer: >60 mL/min (ref >60)
Glucose, Bld: 102 mg/dL — ABNORMAL HIGH (ref 65–99)
Potassium: 3.6 mmol/L (ref 3.5–5.1)
Sodium: 135 mmol/L (ref 135–145)
Total Bilirubin: 0.4 mg/dL (ref 0.3–1.2)
Total Protein: 6.9 g/dL (ref 6.5–8.1)

## 2015-02-05 LAB — CBC WITH DIFFERENTIAL/PLATELET
Basophils Absolute: 0.1 10*3/uL (ref 0–0.1)
Eosinophils Absolute: 0.1 10*3/uL (ref 0–0.7)
HCT: 35.8 % (ref 35.0–47.0)
Hemoglobin: 12.2 g/dL (ref 12.0–16.0)
Lymphocytes Relative: 20 %
Lymphs Abs: 1.2 10*3/uL (ref 1.0–3.6)
MCH: 28.6 pg (ref 26.0–34.0)
MCHC: 34.1 g/dL (ref 32.0–36.0)
MCV: 83.7 fL (ref 80.0–100.0)
Monocytes Absolute: 0.8 10*3/uL (ref 0.2–0.9)
Monocytes Relative: 14 %
Neutro Abs: 3.9 10*3/uL (ref 1.4–6.5)
Platelets: 191 10*3/uL (ref 150–440)
RBC: 4.27 MIL/uL (ref 3.80–5.20)
RDW: 14.7 % — AB (ref 11.5–14.5)
WBC: 6.1 10*3/uL (ref 3.6–11.0)

## 2015-02-05 LAB — LACTATE DEHYDROGENASE: LDH: 116 U/L (ref 98–192)

## 2015-02-05 MED ORDER — HEPARIN SOD (PORK) LOCK FLUSH 100 UNIT/ML IV SOLN
500.0000 [IU] | Freq: Once | INTRAVENOUS | Status: AC
  Administered 2015-02-05: 500 [IU] via INTRAVENOUS

## 2015-02-05 MED ORDER — SODIUM CHLORIDE 0.9 % IJ SOLN
10.0000 mL | Freq: Once | INTRAMUSCULAR | Status: AC
  Administered 2015-02-05: 10 mL via INTRAVENOUS
  Filled 2015-02-05: qty 10

## 2015-02-05 MED ORDER — HEPARIN SOD (PORK) LOCK FLUSH 100 UNIT/ML IV SOLN
INTRAVENOUS | Status: AC
  Filled 2015-02-05: qty 5

## 2015-02-10 DIAGNOSIS — C8334 Diffuse large B-cell lymphoma, lymph nodes of axilla and upper limb: Secondary | ICD-10-CM | POA: Insufficient documentation

## 2015-02-10 NOTE — Progress Notes (Signed)
[place] @ [facility] Telephone:(336) [phone]  Fax:(336) [place] OB: 1951-05-06  MR#: [id number]  XJO#:[id number]  Patient Care Team: Corey Skains, MD as PCP - General (Internal Medicine)  CHIEF COMPLAINT:  Chief Complaint  Patient presents with  . Follow-up    Lymphoma    Oncology History   1.Marland Kitchenlymph node biopsy from the right axillary lymph node is positive for diffuse B. large cell lymphoma BCL 6 rearrangement positive.MYC negative with activated B cells.  LDH is 333 done at lab-crp  Stage III.  bone marrow biopsy is negative for any involvement with lymphoma She started chemotherapy with R. CHOP in September of 2015.Marland Kitchen PATIENT HAS FINISHED TOTAL 6 CYCLES OF CHEMOTHERAPY WITH r chop (jANUARY, 2016) PET scan shows complete response  Patient had a right medial breast lesion 1.5 cm tumorjunctional and lentiginous dysplastic nevus with mild-to-moderate atypia 3.  Skin from left forearm hypertrophic actinic keratosis.  And superficial basal cell carcinoma.  From multiple area      Diffuse large B cell lymphoma   02/10/2015 Initial Diagnosis Diffuse large B cell lymphoma    No flowsheet data found.  INTERVAL HISTORY:  Dr. Ronnald Ramp came today further follow-up regarding diffuse large B-cell lymphoma.  No chills.  No fever.  Appetite has been stable.  No nausea.  No vomiting. REVIEW OF SYSTEMS:   Review of Systems  Constitutional: Negative for fever, chills, weight loss, malaise/fatigue and diaphoresis.  HENT: Negative for congestion, ear discharge, ear pain, hearing loss, nosebleeds, sore throat (dysphagia) and tinnitus.   Eyes: Negative for blurred vision, double vision, photophobia, pain, discharge and redness.  Respiratory: Negative for cough, hemoptysis, sputum production, shortness of breath, wheezing and stridor.   Cardiovascular: Negative for chest pain, palpitations, orthopnea, claudication, leg swelling and PND.  Gastrointestinal: Positive for  heartburn. Negative for nausea, vomiting, abdominal pain, diarrhea, constipation, blood in stool and melena.  Genitourinary: Negative for dysuria, urgency, frequency, hematuria and flank pain.  Musculoskeletal: Negative for myalgias, back pain, joint pain, falls and neck pain.  Skin: Negative for itching and rash.  Neurological: Negative for dizziness, tingling, tremors, sensory change, speech change, focal weakness, seizures, loss of consciousness, weakness and headaches.  Endo/Heme/Allergies: Negative for environmental allergies and polydipsia. Does not bruise/bleed easily.  Psychiatric/Behavioral: Negative for depression, suicidal ideas, hallucinations, memory loss and substance abuse. The patient is not nervous/anxious and does not have insomnia.   All other systems reviewed and are negative.   As per HPI. Otherwise, a complete review of systems is negatve.  PAST MEDICAL HISTORY: Past Medical History  Diagnosis Date  . Melanoma   . Lymphoma   . Anxiety   . Clotting disorder   . Hyperlipemia   . CAD (coronary artery disease)     PAST SURGICAL HISTORY: Past Surgical History  Procedure Laterality Date  . Gender re-affirmation      FAMILY HISTORY  PFSH: Comments: family history of prostate cancer and thyroid cancer.  Social History: negative alcohol, negative tobacco  Additional Past Medical and Surgical History: coronary artery disease with multiple stent placement  History of multiple surgery for gender re-affirmation.  history of  complex nevus as well as basal cell cancer as described above  surgery for gender reaffirmation       ADVANCED DIRECTIVES:   Advance Directive:  Advance Directive ([place]) yes(1)   Do you want to revise or change your advance directive? No(1)   HEALTH MAINTENANCE: History  Substance Use Topics  .  Smoking status: Never Smoker   . Smokeless tobacco: Not on file  . Alcohol Use: No      Current Outpatient Prescriptions    Medication Sig Dispense Refill  . aspirin 81 MG tablet Take 81 mg by mouth daily.    . clopidogrel (PLAVIX) 75 MG tablet Take 75 mg by mouth daily.    Marland Kitchen estradiol valerate (DELESTROGEN) 40 MG/ML injection Inject 40 mg into the muscle every 28 (twenty-eight) days.    . nebivolol (BYSTOLIC) 2.5 MG tablet Take 2.5 mg by mouth daily.    Marland Kitchen olmesartan (BENICAR) 20 MG tablet Take 20 mg by mouth daily.    . ondansetron (ZOFRAN) 4 MG tablet Take 4 mg by mouth every 6 (six) hours as needed for nausea or vomiting.    . predniSONE (DELTASONE) 50 MG tablet Take 50 mg by mouth 2 (two) times daily. Take 2 tabs x 5 days after chemotherapy    . rosuvastatin (CRESTOR) 20 MG tablet Take 20 mg by mouth daily.     No current facility-administered medications for this visit.    OBJECTIVE: Filed Vitals:   02/05/15 1213  BP: 146/82  Pulse: 72  Temp: 96.1 F (35.6 C)     Body mass index is 27.1 kg/(m^2).    ECOG FS:0 - Asymptomatic  Physical Exam  Constitutional: She is oriented to person, place, and time and well-developed, well-nourished, and in no distress. No distress.  HENT:  Head: Normocephalic and atraumatic.  Right Ear: External ear normal.  Left Ear: External ear normal.  Nose: Nose normal.  Mouth/Throat: Oropharynx is clear and moist.  Eyes: Conjunctivae and EOM are normal.  Neck: Normal range of motion. No JVD present. No tracheal deviation present. No thyromegaly present.  Cardiovascular: Normal rate and regular rhythm.  Exam reveals no friction rub.   No murmur heard. Pulmonary/Chest: No stridor. No respiratory distress. She has no wheezes. She has no rales. She exhibits no tenderness.  Abdominal: She exhibits no distension and no mass. There is no tenderness. There is no rebound.  Musculoskeletal: She exhibits no edema or tenderness.  Lymphadenopathy:    She has cervical adenopathy (small palpreable lymph node in right submandibular ae).  Neurological: She is alert and oriented to  person, place, and time. She has normal reflexes. Gait normal. GCS score is 15.  Skin: Skin is warm and dry. No rash noted. She is not diaphoretic.  Psychiatric: Mood, memory and affect normal.  Nursing note and vitals reviewed.    LAB RESULTS:     Component Value Date/Time   NA 135 02/05/2015 1213   NA 139 10/16/2014 0845   K 3.6 02/05/2015 1213   K 3.7 10/16/2014 0845   CL 105 02/05/2015 1213   CL 107 10/16/2014 0845   CO2 26 02/05/2015 1213   CO2 25 10/16/2014 0845   GLUCOSE 102* 02/05/2015 1213   GLUCOSE 80 10/16/2014 0845   BUN 15 02/05/2015 1213   BUN 11 10/16/2014 0845   CREATININE 0.80 02/05/2015 1213   CREATININE 0.85 10/16/2014 0845   CALCIUM 8.5* 02/05/2015 1213   CALCIUM 8.1* 10/16/2014 0845   PROT 6.9 02/05/2015 1213   PROT 6.4 10/16/2014 0845   ALBUMIN 3.9 02/05/2015 1213   ALBUMIN 3.1* 10/16/2014 0845   AST 27 02/05/2015 1213   AST 23 10/16/2014 0845   ALT 27 02/05/2015 1213   ALT 18 10/16/2014 0845   ALKPHOS 44 02/05/2015 1213   ALKPHOS 47 10/16/2014 0845   BILITOT 0.4 02/05/2015  [place] 02/05/2015 1213   GFRAA >60 02/05/2015 1213    No results found for: SPEP, UPEP  Lab Results  Component Value Date   WBC 6.1 02/05/2015   NEUTROABS 3.9 02/05/2015   HGB 12.2 02/05/2015   HCT 35.8 02/05/2015   MCV 83.7 02/05/2015   PLT 191 02/05/2015      Chemistry      Component Value Date/Time   NA 135 02/05/2015 1213   NA 139 10/16/2014 0845   K 3.6 02/05/2015 1213   K 3.7 10/16/2014 0845   CL 105 02/05/2015 1213   CL 107 10/16/2014 0845   CO2 26 02/05/2015 1213   CO2 25 10/16/2014 0845   BUN 15 02/05/2015 1213   BUN 11 10/16/2014 0845   CREATININE 0.80 02/05/2015 1213   CREATININE 0.85 10/16/2014 0845      Component Value Date/Time   CALCIUM 8.5* 02/05/2015 1213   CALCIUM 8.1* 10/16/2014 0845   ALKPHOS 44 02/05/2015 1213   ALKPHOS 47 10/16/2014 0845   AST 27 02/05/2015 1213   AST 23 10/16/2014 0845   ALT 27 02/05/2015 1213    ALT 18 10/16/2014 0845   BILITOT 0.4 02/05/2015 1213          ASSESSMENT: 1.diffuse large  cell lymphoma stage III status post 3 cycles of chemotherapy patient has excellent response on clinical ground.   After f 6 cycles of chemotherapy patient has negative PET scan.  Repeat PET scan in 3 months. On clinical grounds there is no evidence of recurrent or progressive disease  I discuss situation with Dr. Renetta Chalk at [facility] bone marrow transplant team  Patient does have a high risk for recurrent disease particularly slightly elevated LDH and activated B cells.  But he would not consider this patient for consultation high-dose chemotherapy at present time as chances of her staying in remission is more than 50% at present time.  I offered patient opinion at [facility] if she desires or continue follow-up.  She understands that.  Dysphagia: Patient was advised to get upper endoscopy done if problem persist   Patient expressed understanding and was in agreement with this plan. She also understands that She can call clinic at any time with any questions, concerns, or complaints.    Diffuse large B cell lymphoma   Staging form: Lymphoid Neoplasms, AJCC 6th Edition     Clinical: No stage assigned - Marni Griffon, MD   02/10/2015 10:15 AM

## 2015-02-12 ENCOUNTER — Inpatient Hospital Stay (HOSPITAL_BASED_OUTPATIENT_CLINIC_OR_DEPARTMENT_OTHER): Payer: 59 | Admitting: Oncology

## 2015-02-12 VITALS — BP 119/80 | HR 82 | Temp 96.4°F | Wt 192.5 lb

## 2015-02-12 DIAGNOSIS — Z9221 Personal history of antineoplastic chemotherapy: Secondary | ICD-10-CM | POA: Diagnosis not present

## 2015-02-12 DIAGNOSIS — Z85820 Personal history of malignant melanoma of skin: Secondary | ICD-10-CM

## 2015-02-12 DIAGNOSIS — Z85828 Personal history of other malignant neoplasm of skin: Secondary | ICD-10-CM | POA: Diagnosis not present

## 2015-02-12 DIAGNOSIS — Z7982 Long term (current) use of aspirin: Secondary | ICD-10-CM

## 2015-02-12 DIAGNOSIS — F419 Anxiety disorder, unspecified: Secondary | ICD-10-CM

## 2015-02-12 DIAGNOSIS — I2510 Atherosclerotic heart disease of native coronary artery without angina pectoris: Secondary | ICD-10-CM

## 2015-02-12 DIAGNOSIS — C8338 Diffuse large B-cell lymphoma, lymph nodes of multiple sites: Secondary | ICD-10-CM

## 2015-02-12 DIAGNOSIS — C8590 Non-Hodgkin lymphoma, unspecified, unspecified site: Secondary | ICD-10-CM

## 2015-02-12 DIAGNOSIS — E785 Hyperlipidemia, unspecified: Secondary | ICD-10-CM

## 2015-02-12 DIAGNOSIS — Z79899 Other long term (current) drug therapy: Secondary | ICD-10-CM

## 2015-02-12 DIAGNOSIS — L570 Actinic keratosis: Secondary | ICD-10-CM

## 2015-02-12 DIAGNOSIS — Z87890 Personal history of sex reassignment: Secondary | ICD-10-CM

## 2015-02-28 ENCOUNTER — Other Ambulatory Visit: Payer: Self-pay

## 2015-02-28 NOTE — Patient Outreach (Signed)
[facility]) Care Management  02/28/2015  Kendra Salas 1951/07/15 [id number] High cost out reach call.  No answer and unable to leave message. Peter Garter RN, [facility] Care Management Coordinator-Link to [place] Management [phone]

## 2015-03-03 ENCOUNTER — Encounter: Payer: Self-pay | Admitting: Oncology

## 2015-03-03 DIAGNOSIS — R161 Splenomegaly, not elsewhere classified: Secondary | ICD-10-CM | POA: Insufficient documentation

## 2015-03-03 DIAGNOSIS — K5790 Diverticulosis of intestine, part unspecified, without perforation or abscess without bleeding: Secondary | ICD-10-CM | POA: Insufficient documentation

## 2015-03-03 DIAGNOSIS — I10 Essential (primary) hypertension: Secondary | ICD-10-CM | POA: Insufficient documentation

## 2015-03-03 DIAGNOSIS — E785 Hyperlipidemia, unspecified: Secondary | ICD-10-CM | POA: Insufficient documentation

## 2015-03-03 DIAGNOSIS — B49 Unspecified mycosis: Secondary | ICD-10-CM | POA: Insufficient documentation

## 2015-03-03 DIAGNOSIS — I2510 Atherosclerotic heart disease of native coronary artery without angina pectoris: Secondary | ICD-10-CM | POA: Insufficient documentation

## 2015-03-03 NOTE — Progress Notes (Signed)
[place] @ [facility] Telephone:(336) [phone]  Fax:(336) [place] OB: Jan 05, 1951  MR#: [id number]  EXH#:[id number]  Patient Care Team: Corey Skains, MD as PCP - General (Internal Medicine)  CHIEF COMPLAINT:  Chief Complaint  Patient presents with  . Follow-up    Oncology History   1.Marland Kitchenlymph node biopsy from the right axillary lymph node is positive for diffuse B. large cell lymphoma BCL 6 rearrangement positive.MYC negative with activated B cells.  LDH is 333 done at lab-crp  Stage III.  bone marrow biopsy is negative for any involvement with lymphoma She started chemotherapy with R. CHOP in September of 2015.Marland Kitchen PATIENT HAS FINISHED TOTAL 6 CYCLES OF CHEMOTHERAPY WITH r chop (jANUARY, 2016) PET scan shows complete response  Patient had a right medial breast lesion 1.5 cm tumorjunctional and lentiginous dysplastic nevus with mild-to-moderate atypia 3.  Skin from left forearm hypertrophic actinic keratosis.  And superficial basal cell carcinoma.  From multiple area      Diffuse large B cell lymphoma   02/10/2015 Initial Diagnosis Diffuse large B cell lymphoma    No flowsheet data found.  INTERVAL HISTORY:  Dr. Ronnald Ramp came today further follow-up regarding diffuse large B-cell lymphoma.  No chills.  No fever.  Appetite has been stable.  No nausea.  No vomiting. C was concerned about a lymph node in the right submandibular area REVIEW OF SYSTEMS:   Review of Systems  Constitutional: Negative for fever, chills, weight loss, malaise/fatigue and diaphoresis.  HENT: Negative for congestion, ear discharge, ear pain, hearing loss, nosebleeds, sore throat and tinnitus.        SMALL palpable submandibular lymph node less than 0.5 cm  Eyes: Negative for blurred vision, double vision, photophobia, pain, discharge and redness.  Respiratory: Negative for cough, hemoptysis, sputum production, shortness of breath, wheezing and stridor.   Cardiovascular: Negative for  chest pain, palpitations, orthopnea, claudication, leg swelling and PND.  Gastrointestinal: Negative for heartburn, nausea, vomiting, abdominal pain, diarrhea, constipation, blood in stool and melena.  Genitourinary: Negative for dysuria, urgency, frequency, hematuria and flank pain.  Musculoskeletal: Negative for myalgias, back pain, joint pain, falls and neck pain.  Skin: Negative for itching and rash.  Neurological: Negative for dizziness, tingling, tremors, sensory change, speech change, focal weakness, seizures, loss of consciousness, weakness and headaches.  Endo/Heme/Allergies: Negative for environmental allergies and polydipsia. Does not bruise/bleed easily.  Psychiatric/Behavioral: Negative for depression, suicidal ideas, hallucinations, memory loss and substance abuse. The patient is not nervous/anxious and does not have insomnia.     As per HPI. Otherwise, a complete review of systems is negatve.  PAST MEDICAL HISTORY: Past Medical History  Diagnosis Date  . Melanoma   . Lymphoma   . Anxiety   . Clotting disorder   . Hyperlipemia   . CAD (coronary artery disease)     PAST SURGICAL HISTORY: Past Surgical History  Procedure Laterality Date  . Gender re-affirmation      FAMILY HISTORY  PFSH: Comments: family history of prostate cancer and thyroid cancer.  Social History: negative alcohol, negative tobacco  Additional Past Medical and Surgical History: coronary artery disease with multiple stent placement  History of multiple surgery for gender re-affirmation.  history of  complex nevus as well as basal cell cancer as described above  surgery for gender reaffirmation       ADVANCED DIRECTIVES: Patient does have advanced health care directive  Advance Directive:  Advance Directive ([place]) yes(1)   Do you  want to revise or change your advance directive? No(1)   HEALTH MAINTENANCE: History  Substance Use Topics  . Smoking status: Never Smoker   .  Smokeless tobacco: Not on file  . Alcohol Use: No      Current Outpatient Prescriptions  Medication Sig Dispense Refill  . aspirin 81 MG tablet Take 81 mg by mouth daily.    . clopidogrel (PLAVIX) 75 MG tablet Take 75 mg by mouth daily.    . clopidogrel (PLAVIX) 75 MG tablet Take by mouth.    . estradiol valerate (DELESTROGEN) 40 MG/ML injection Inject 40 mg into the muscle every 28 (twenty-eight) days.    . hydrochlorothiazide (HYDRODIURIL) 25 MG tablet Take by mouth.    . losartan (COZAAR) 50 MG tablet Take by mouth.    . nebivolol (BYSTOLIC) 2.5 MG tablet Take 2.5 mg by mouth daily.    . niacin (NIASPAN) 500 MG CR tablet Take by mouth.    . olmesartan (BENICAR) 20 MG tablet Take 20 mg by mouth daily.    . ondansetron (ZOFRAN) 4 MG tablet Take 4 mg by mouth every 6 (six) hours as needed for nausea or vomiting.    . predniSONE (DELTASONE) 50 MG tablet Take 50 mg by mouth 2 (two) times daily. Take 2 tabs x 5 days after chemotherapy    . rosuvastatin (CRESTOR) 20 MG tablet Take 20 mg by mouth daily.    . rosuvastatin (CRESTOR) 20 MG tablet Take by mouth.    . Estradiol 0.75 MG/1.25 GM (0.06%) topical gel Place onto the skin.     No current facility-administered medications for this visit.    OBJECTIVE: Filed Vitals:   02/12/15 1342  BP: 119/80  Pulse: 82  Temp: 96.4 F (35.8 C)     Body mass index is 26.85 kg/(m^2).    ECOG FS:0 - Asymptomatic  Physical Exam  Constitutional: She is oriented to person, place, and time and well-developed, well-nourished, and in no distress. No distress.  HENT:  Head: Normocephalic and atraumatic.  Right Ear: External ear normal.  Left Ear: External ear normal.  Nose: Nose normal.  Mouth/Throat: Oropharynx is clear and moist.  Eyes: Conjunctivae and EOM are normal.  Neck: Normal range of motion. No JVD present. No tracheal deviation present. No thyromegaly present.  Cardiovascular: Normal rate and regular rhythm.  Exam reveals no friction  rub.   No murmur heard. Pulmonary/Chest: No stridor. No respiratory distress. She has no wheezes. She has no rales. She exhibits no tenderness.  Abdominal: She exhibits no distension and no mass. There is no tenderness. There is no rebound.  Musculoskeletal: She exhibits no edema or tenderness.  Lymphadenopathy:    She has cervical adenopathy (small palpreable lymph node in right submandibular ae).  Neurological: She is alert and oriented to person, place, and time. She has normal reflexes. Gait normal. GCS score is 15.  Skin: Skin is warm and dry. No rash noted. She is not diaphoretic.  Psychiatric: Mood, memory and affect normal.  Nursing note and vitals reviewed.    LAB RESULTS:     Component Value Date/Time   NA 135 02/05/2015 1213   NA 139 10/16/2014 0845   K 3.6 02/05/2015 1213   K 3.7 10/16/2014 0845   CL 105 02/05/2015 1213   CL 107 10/16/2014 0845   CO2 26 02/05/2015 1213   CO2 25 10/16/2014 0845   GLUCOSE 102* 02/05/2015 1213   GLUCOSE 80 10/16/2014 0845   BUN 15 02/05/2015 1213  BUN 11 10/16/2014 0845   CREATININE 0.80 02/05/2015 1213   CREATININE 0.85 10/16/2014 0845   CALCIUM 8.5* 02/05/2015 1213   CALCIUM 8.1* 10/16/2014 0845   PROT 6.9 02/05/2015 1213   PROT 6.4 10/16/2014 0845   ALBUMIN 3.9 02/05/2015 1213   ALBUMIN 3.1* 10/16/2014 0845   AST 27 02/05/2015 1213   AST 23 10/16/2014 0845   ALT 27 02/05/2015 1213   ALT 18 10/16/2014 0845   ALKPHOS 44 02/05/2015 1213   ALKPHOS 47 10/16/2014 0845   BILITOT 0.4 02/05/2015 1213   GFRNONAA >60 02/05/2015 1213   GFRAA >60 02/05/2015 1213    No results found for: SPEP, UPEP  Lab Results  Component Value Date   WBC 6.1 02/05/2015   NEUTROABS 3.9 02/05/2015   HGB 12.2 02/05/2015   HCT 35.8 02/05/2015   MCV 83.7 02/05/2015   PLT 191 02/05/2015      Chemistry      Component Value Date/Time   NA 135 02/05/2015 1213   NA 139 10/16/2014 0845   K 3.6 02/05/2015 1213   K 3.7 10/16/2014 0845   CL 105  02/05/2015 1213   CL 107 10/16/2014 0845   CO2 26 02/05/2015 1213   CO2 25 10/16/2014 0845   BUN 15 02/05/2015 1213   BUN 11 10/16/2014 0845   CREATININE 0.80 02/05/2015 1213   CREATININE 0.85 10/16/2014 0845      Component Value Date/Time   CALCIUM 8.5* 02/05/2015 1213   CALCIUM 8.1* 10/16/2014 0845   ALKPHOS 44 02/05/2015 1213   ALKPHOS 47 10/16/2014 0845   AST 27 02/05/2015 1213   AST 23 10/16/2014 0845   ALT 27 02/05/2015 1213   ALT 18 10/16/2014 0845   BILITOT 0.4 02/05/2015 1213          ASSESSMENT: 1.diffuse large  cell lymphoma stage III status post 3 cycles of chemotherapy patient has excellent response on clinical ground.  Small palpable lymph node in submandibular area  Clinically he does not appear to be significant  Patient was advised to watch it and if it continues to grow a PET scan or biopsy would be recommended t   Patient expressed understanding and was in agreement with this plan. She also understands that She can call clinic at any time with any questions, concerns, or complaints.    Diffuse large B cell lymphoma   Staging form: Lymphoid Neoplasms, AJCC 6th Edition     Clinical: No stage assigned - Marni Griffon, MD   03/03/2015 8:33 AM

## 2015-03-05 ENCOUNTER — Other Ambulatory Visit: Payer: Self-pay

## 2015-03-05 NOTE — Patient Outreach (Signed)
[place] [facility]) Care Management  03/05/2015  Kendra Salas 08-21-1951 [id number]  Call made for high cost referral.  No answer and unable to leave message. Peter Garter RN, [facility] Care Management Coordinator-Link to [place] Management [phone]

## 2015-03-11 ENCOUNTER — Other Ambulatory Visit: Payer: Self-pay

## 2015-03-11 NOTE — Patient Outreach (Signed)
[place] [facility]) Care Management  03/11/2015  Kendra Salas 09/06/1951 [id number]   High cost out reach call. third attempt.  No answer and unable to leave message.  Plan to send out reach letter. Peter Garter RN, [facility] Care Management Coordinator-Link to [place] Management [phone]

## 2015-04-12 ENCOUNTER — Telehealth: Payer: Self-pay | Admitting: *Deleted

## 2015-04-12 NOTE — Telephone Encounter (Signed)
After scanning records, I do not see that she received zoster inj. I checked with L Herring AGNP regarding whether she could receive it without problems and she said there should not be a problems with her getting it since she has completed therapy. Patient was notified of this. Sh estated that she had gotten the TDAP and Prevnar 13 immunizations on her 1st or 2nd cycle of chemo

## 2015-04-17 ENCOUNTER — Other Ambulatory Visit: Payer: Self-pay

## 2015-05-02 ENCOUNTER — Other Ambulatory Visit: Payer: Self-pay

## 2015-05-02 DIAGNOSIS — I10 Essential (primary) hypertension: Secondary | ICD-10-CM

## 2015-05-02 DIAGNOSIS — I2510 Atherosclerotic heart disease of native coronary artery without angina pectoris: Secondary | ICD-10-CM

## 2015-05-02 DIAGNOSIS — E785 Hyperlipidemia, unspecified: Secondary | ICD-10-CM

## 2015-05-02 MED ORDER — CLOPIDOGREL BISULFATE 75 MG PO TABS: 75.0000 mg | ORAL_TABLET | Freq: Every day | ORAL | Status: DC

## 2015-05-02 MED ORDER — LOSARTAN POTASSIUM 50 MG PO TABS: 50.0000 mg | ORAL_TABLET | Freq: Every day | ORAL | Status: DC

## 2015-05-02 MED ORDER — ROSUVASTATIN CALCIUM 20 MG PO TABS: 20.0000 mg | ORAL_TABLET | Freq: Every day | ORAL | Status: DC

## 2015-05-08 ENCOUNTER — Ambulatory Visit
Admission: RE | Admit: 2015-05-08 | Discharge: 2015-05-08 | Disposition: A | Discharge status: Home / Self Care | Payer: 59 | Source: Ambulatory Visit | Attending: Oncology | Admitting: Oncology

## 2015-05-08 DIAGNOSIS — C8589 Other specified types of non-Hodgkin lymphoma, extranodal and solid organ sites: Secondary | ICD-10-CM | POA: Insufficient documentation

## 2015-05-08 DIAGNOSIS — I2510 Atherosclerotic heart disease of native coronary artery without angina pectoris: Secondary | ICD-10-CM | POA: Diagnosis not present

## 2015-05-08 DIAGNOSIS — C8299 Follicular lymphoma, unspecified, extranodal and solid organ sites: Secondary | ICD-10-CM

## 2015-05-08 LAB — GLUCOSE, CAPILLARY: GLUCOSE-CAPILLARY: 87 mg/dL (ref 65–99)

## 2015-05-08 MED ORDER — FLUDEOXYGLUCOSE F - 18 (FDG) INJECTION
12.3400 | Freq: Once | INTRAVENOUS | Status: AC | PRN
  Administered 2015-05-08: 12.34 via INTRAVENOUS

## 2015-05-13 ENCOUNTER — Inpatient Hospital Stay: Payer: 59 | Attending: Oncology | Admitting: Oncology

## 2015-05-13 ENCOUNTER — Encounter: Payer: Self-pay | Admitting: Oncology

## 2015-05-13 ENCOUNTER — Inpatient Hospital Stay: Payer: 59

## 2015-05-13 VITALS — BP 141/93 | HR 109 | Temp 97.2°F | Wt 199.3 lb

## 2015-05-13 DIAGNOSIS — F419 Anxiety disorder, unspecified: Secondary | ICD-10-CM

## 2015-05-13 DIAGNOSIS — Z9221 Personal history of antineoplastic chemotherapy: Secondary | ICD-10-CM | POA: Diagnosis not present

## 2015-05-13 DIAGNOSIS — E785 Hyperlipidemia, unspecified: Secondary | ICD-10-CM

## 2015-05-13 DIAGNOSIS — C8330 Diffuse large B-cell lymphoma, unspecified site: Secondary | ICD-10-CM

## 2015-05-13 DIAGNOSIS — C8338 Diffuse large B-cell lymphoma, lymph nodes of multiple sites: Secondary | ICD-10-CM

## 2015-05-13 DIAGNOSIS — Z85820 Personal history of malignant melanoma of skin: Secondary | ICD-10-CM | POA: Diagnosis not present

## 2015-05-13 DIAGNOSIS — Z452 Encounter for adjustment and management of vascular access device: Secondary | ICD-10-CM | POA: Diagnosis not present

## 2015-05-13 DIAGNOSIS — I2510 Atherosclerotic heart disease of native coronary artery without angina pectoris: Secondary | ICD-10-CM | POA: Diagnosis not present

## 2015-05-13 DIAGNOSIS — Z85828 Personal history of other malignant neoplasm of skin: Secondary | ICD-10-CM | POA: Diagnosis not present

## 2015-05-13 DIAGNOSIS — Z79899 Other long term (current) drug therapy: Secondary | ICD-10-CM | POA: Diagnosis not present

## 2015-05-13 LAB — LACTATE DEHYDROGENASE: LDH: 116 U/L (ref 98–192)

## 2015-05-13 LAB — COMPREHENSIVE METABOLIC PANEL
ALK PHOS: 56 U/L (ref 38–126)
ALT: 17 U/L (ref 14–54)
ANION GAP: 6 (ref 5–15)
AST: 19 U/L (ref 15–41)
Albumin: 3.9 g/dL (ref 3.5–5.0)
BILIRUBIN TOTAL: 0.7 mg/dL (ref 0.3–1.2)
BUN: 12 mg/dL (ref 6–20)
CALCIUM: 8.2 mg/dL — AB (ref 8.9–10.3)
CHLORIDE: 102 mmol/L (ref 101–111)
CO2: 24 mmol/L (ref 22–32)
Creatinine, Ser: 0.93 mg/dL (ref 0.44–1.00)
GFR calc Af Amer: >60 mL/min (ref >60)
Glucose, Bld: 94 mg/dL (ref 65–99)
Potassium: 3.4 mmol/L — ABNORMAL LOW (ref 3.5–5.1)
Sodium: 132 mmol/L — ABNORMAL LOW (ref 135–145)
Total Protein: 7 g/dL (ref 6.5–8.1)

## 2015-05-13 LAB — CBC WITH DIFFERENTIAL/PLATELET
Basophils Absolute: 0 10*3/uL (ref 0–0.1)
Basophils Relative: 1 %
Eosinophils Absolute: 0.1 10*3/uL (ref 0–0.7)
Eosinophils Relative: 1 %
HEMATOCRIT: 36.8 % (ref 35.0–47.0)
HEMOGLOBIN: 12.4 g/dL (ref 12.0–16.0)
Lymphocytes Relative: 12 %
Lymphs Abs: 0.8 10*3/uL — ABNORMAL LOW (ref 1.0–3.6)
MCH: 28.6 pg (ref 26.0–34.0)
MCHC: 33.8 g/dL (ref 32.0–36.0)
MCV: 84.4 fL (ref 80.0–100.0)
Monocytes Absolute: 0.7 10*3/uL (ref 0.2–0.9)
Monocytes Relative: 10 %
NEUTROS PCT: 76 %
Neutro Abs: 5.4 10*3/uL (ref 1.4–6.5)
PLATELETS: 163 10*3/uL (ref 150–440)
RBC: 4.36 MIL/uL (ref 3.80–5.20)
RDW: 15.2 % — ABNORMAL HIGH (ref 11.5–14.5)
WBC: 7 10*3/uL (ref 3.6–11.0)

## 2015-05-13 NOTE — Progress Notes (Signed)
After scanning records, I do not see that she received zoster inj. I checked with L Herring AGNP regarding whether she could receive it without problems and she said there should not be a problems with her getting it since she has completed therapy. Patient was notified of this. Sh estated that she had gotten the TDAP and Prevnar 13 immunizations on her 1st or 2nd cycle of chemo  [place] @ [facility] Telephone:(336) [phone]  Fax:(336) [place] OB: 14-Jun-1951  MR#: [id number]  ZYS#:[id number]  Patient Care Team: Corey Skains, MD as PCP - General (Internal Medicine)  CHIEF COMPLAINT:  Chief Complaint  Patient presents with  . Follow-up    Oncology History   1.Marland Kitchenlymph node biopsy from the right axillary lymph node is positive for diffuse B. large cell lymphoma BCL 6 rearrangement positive.MYC negative with activated B cells.  LDH is 333 done at lab-crp  Stage III.  bone marrow biopsy is negative for any involvement with lymphoma She started chemotherapy with R. CHOP in September of 2015.Marland Kitchen PATIENT HAS FINISHED TOTAL 6 CYCLES OF CHEMOTHERAPY WITH r chop (jANUARY, 2016) PET scan shows complete response  Patient had a right medial breast lesion 1.5 cm tumorjunctional and lentiginous dysplastic nevus with mild-to-moderate atypia 3.  Skin from left forearm hypertrophic actinic keratosis.  And superficial basal cell carcinoma.  From multiple area      Diffuse large B cell lymphoma   02/10/2015 Initial Diagnosis Diffuse large B cell lymphoma    No flowsheet data found.  INTERVAL HISTORY:  Dr. Ronnald Ramp came today further follow-up regarding diffuse large B-cell lymphoma.  No chills.  No fever.  Appetite has been stable.  No nausea.  No vomiting. C was concerned about a lymph node in the right submandibular area May 13, 2015 Patient is here for ongoing evaluation and treatment consideration Patient had a PET scan done.  Since last evaluation has not noticed any  palpable lymph node.  No chills.  No fever.  Working full-time. Appetite has been stable. REVIEW OF SYSTEMS:   ROS GENERAL:  Feels good.  Active.  No fevers, sweats or weight loss. PERFORMANCE STATUS (ECOG):  0 HEENT:  No visual changes, runny nose, sore throat, mouth sores or tenderness. Lungs: No shortness of breath or cough.  No hemoptysis. Cardiac:  No chest pain, palpitations, orthopnea, or PND. GI:  No nausea, vomiting, diarrhea, constipation, melena or hematochezia. GU:  No urgency, frequency, dysuria, or hematuria. Musculoskeletal:  No back pain.  No joint pain.  No muscle tenderness. Extremities:  No pain or swelling. Skin:  No rashes or skin changes. Neuro:  No headache, numbness or weakness, balance or coordination issues. Endocrine:  No diabetes, thyroid issues, hot flashes or night sweats. Psych:  No mood changes, depression or anxiety. Pain:  No focal pain. Review of systems:  All other systems reviewed and found to be negative.  As per HPI. Otherwise, a complete review of systems is negatve.  PAST MEDICAL HISTORY: Past Medical History  Diagnosis Date  . Melanoma   . Lymphoma   . Anxiety   . Clotting disorder   . Hyperlipemia   . CAD (coronary artery disease)     PAST SURGICAL HISTORY: Past Surgical History  Procedure Laterality Date  . Gender re-affirmation      FAMILY HISTORY  PFSH: Comments: family history of prostate cancer and thyroid cancer.  Social History: negative alcohol, negative tobacco  Additional Past Medical and Surgical History:  coronary artery disease with multiple stent placement  History of multiple surgery for gender re-affirmation.  history of  complex nevus as well as basal cell cancer as described above  surgery for gender reaffirmation       ADVANCED DIRECTIVES: Patient does have advanced health care directive  Advance Directive:  Advance Directive ([place]) yes(1)   Do you want to revise or change your advance  directive? No(1)   HEALTH MAINTENANCE: History  Substance Use Topics  . Smoking status: Never Smoker   . Smokeless tobacco: Not on file  . Alcohol Use: No      Current Outpatient Prescriptions  Medication Sig Dispense Refill  . aspirin 81 MG tablet Take 81 mg by mouth daily.    . clopidogrel (PLAVIX) 75 MG tablet Take by mouth.    . clopidogrel (PLAVIX) 75 MG tablet Take 1 tablet (75 mg total) by mouth daily. 90 tablet 1  . estradiol (ESTRACE) 2 MG tablet Take 2 mg by mouth daily.    . hydrochlorothiazide (HYDRODIURIL) 25 MG tablet Take by mouth.    . losartan (COZAAR) 50 MG tablet Take 1 tablet (50 mg total) by mouth daily. 90 tablet 1  . nebivolol (BYSTOLIC) 2.5 MG tablet Take 2.5 mg by mouth daily.    . niacin (NIASPAN) 500 MG CR tablet Take by mouth.    . ondansetron (ZOFRAN) 4 MG tablet Take 4 mg by mouth every 6 (six) hours as needed for nausea or vomiting.    . predniSONE (DELTASONE) 50 MG tablet Take 50 mg by mouth 2 (two) times daily. Take 2 tabs x 5 days after chemotherapy    . rosuvastatin (CRESTOR) 20 MG tablet Take by mouth.    . Estradiol 0.75 MG/1.25 GM (0.06%) topical gel Place onto the skin.    Marland Kitchen estradiol valerate (DELESTROGEN) 40 MG/ML injection Inject 40 mg into the muscle every 28 (twenty-eight) days.    Marland Kitchen olmesartan (BENICAR) 20 MG tablet Take 20 mg by mouth daily.    . rosuvastatin (CRESTOR) 20 MG tablet Take 1 tablet (20 mg total) by mouth daily. (Patient not taking: Reported on 05/13/2015) 90 tablet 1   No current facility-administered medications for this visit.    OBJECTIVE: Filed Vitals:   05/13/15 0952  BP: 141/93  Pulse: 109  Temp: 97.2 F (36.2 C)     Body mass index is 27.81 kg/(m^2).    ECOG FS:0 - Asymptomatic  Physical Exam  GENERAL:  Well developed, well nourished, sitting comfortably in the exam room in no acute distress. MENTAL STATUS:  Alert and oriented to person, place and time.   RESPIRATORY:  Clear to auscultation without rales,  wheezes or rhonchi. CARDIOVASCULAR:  Regular rate and rhythm without murmur, rub or gallop. . ABDOMEN:  Soft, non-tender, with active bowel sounds, and no hepatosplenomegaly.  No masses. BACK:  No CVA tenderness.  No tenderness on percussion of the back or rib cage. SKIN:  No rashes, ulcers or lesions. Maculopapular rash in lower extremity most likely consistent with insect bite or cat scratch rash Centers applied steroid as well as Bactroban without any significant relief and will call contact primary physician EXTREMITIES: No edema, no skin discoloration or tenderness.  No palpable cords. LYMPH NODES: No palpable cervical, supraclavicular, axillary or inguinal adenopathy  NEUROLOGICAL: Unremarkable. PSYCH:  Appropriate. LAB RESULTS:    No results found for: SPEP, UPEP  Lab Results  Component Value Date   WBC 7.0 05/13/2015   NEUTROABS 5.4 05/13/2015  HGB 12.4 05/13/2015   HCT 36.8 05/13/2015   MCV 84.4 05/13/2015   PLT 163 05/13/2015      Chemistry            ASSESSMENT: 1.diffuse large  cell lymphoma stage III status post 3 cycles of chemotherapy patient has excellent response on clinical ground.  Small  palpable submandibular lymph node is not palpable at present time PET scan has been reviewed independently there is no evidence of recurrent or progressive disease. All lab data or is being reviewed.  LDH is pending and will be reviewed. Reevaluation of patient in 4 months or before if there are any issues or problems   Diffuse large B cell lymphoma   Staging form: Lymphoid Neoplasms, AJCC 6th Edition     Clinical: No stage assigned - Marni Griffon, MD   05/13/2015 10:50 AM

## 2015-05-13 NOTE — Progress Notes (Signed)
Patient does have living will.  Never smoked.  Here today for PET results.

## 2015-05-15 ENCOUNTER — Inpatient Hospital Stay: Payer: 59

## 2015-05-15 DIAGNOSIS — C8338 Diffuse large B-cell lymphoma, lymph nodes of multiple sites: Secondary | ICD-10-CM | POA: Diagnosis not present

## 2015-05-15 DIAGNOSIS — Z95828 Presence of other vascular implants and grafts: Secondary | ICD-10-CM

## 2015-05-15 MED ORDER — HEPARIN SOD (PORK) LOCK FLUSH 100 UNIT/ML IV SOLN
500.0000 [IU] | Freq: Once | INTRAVENOUS | Status: AC
  Administered 2015-05-15: 500 [IU] via INTRAVENOUS

## 2015-05-15 MED ORDER — SODIUM CHLORIDE 0.9 % IJ SOLN
10.0000 mL | INTRAMUSCULAR | Status: DC | PRN
  Administered 2015-05-15: 10 mL via INTRAVENOUS
  Filled 2015-05-15: qty 10

## 2015-05-15 MED ORDER — HEPARIN SOD (PORK) LOCK FLUSH 100 UNIT/ML IV SOLN
INTRAVENOUS | Status: AC
  Filled 2015-05-15: qty 5

## 2015-05-22 ENCOUNTER — Other Ambulatory Visit: Payer: Self-pay

## 2015-05-22 DIAGNOSIS — R110 Nausea: Secondary | ICD-10-CM

## 2015-05-22 MED ORDER — ONDANSETRON HCL 4 MG PO TABS: 4.0000 mg | ORAL_TABLET | Freq: Four times a day (QID) | ORAL | Status: DC | PRN

## 2015-05-28 ENCOUNTER — Other Ambulatory Visit: Payer: Self-pay

## 2015-05-28 DIAGNOSIS — W57XXXA Bitten or stung by nonvenomous insect and other nonvenomous arthropods, initial encounter: Secondary | ICD-10-CM

## 2015-05-28 MED ORDER — TRIAMCINOLONE ACETONIDE 0.1 % EX CREA: 1.0000 "application " | TOPICAL_CREAM | Freq: Two times a day (BID) | CUTANEOUS | Status: DC

## 2015-06-11 ENCOUNTER — Other Ambulatory Visit: Payer: Self-pay

## 2015-06-11 DIAGNOSIS — L259 Unspecified contact dermatitis, unspecified cause: Secondary | ICD-10-CM

## 2015-06-11 DIAGNOSIS — J309 Allergic rhinitis, unspecified: Secondary | ICD-10-CM

## 2015-06-11 MED ORDER — PREDNISONE 10 MG PO TABS: 10.0000 mg | ORAL_TABLET | Freq: Every day | ORAL | Status: DC

## 2015-06-11 MED ORDER — LORATADINE 10 MG PO TABS: 10.0000 mg | ORAL_TABLET | Freq: Every day | ORAL | Status: DC

## 2015-06-26 ENCOUNTER — Inpatient Hospital Stay: Payer: 59 | Attending: Oncology

## 2015-06-26 DIAGNOSIS — Z9221 Personal history of antineoplastic chemotherapy: Secondary | ICD-10-CM | POA: Insufficient documentation

## 2015-06-26 DIAGNOSIS — Z85828 Personal history of other malignant neoplasm of skin: Secondary | ICD-10-CM | POA: Diagnosis not present

## 2015-06-26 DIAGNOSIS — Z452 Encounter for adjustment and management of vascular access device: Secondary | ICD-10-CM | POA: Diagnosis not present

## 2015-06-26 DIAGNOSIS — C8338 Diffuse large B-cell lymphoma, lymph nodes of multiple sites: Secondary | ICD-10-CM | POA: Insufficient documentation

## 2015-06-26 DIAGNOSIS — Z95828 Presence of other vascular implants and grafts: Secondary | ICD-10-CM

## 2015-06-26 DIAGNOSIS — Z85820 Personal history of malignant melanoma of skin: Secondary | ICD-10-CM | POA: Insufficient documentation

## 2015-06-26 MED ORDER — HEPARIN SOD (PORK) LOCK FLUSH 100 UNIT/ML IV SOLN
500.0000 [IU] | Freq: Once | INTRAVENOUS | Status: AC
  Administered 2015-06-26: 500 [IU] via INTRAVENOUS
  Filled 2015-06-26: qty 5

## 2015-06-26 MED ORDER — SODIUM CHLORIDE 0.9 % IJ SOLN
10.0000 mL | INTRAMUSCULAR | Status: DC | PRN
  Administered 2015-06-26: 10 mL via INTRAVENOUS
  Filled 2015-06-26: qty 10

## 2015-08-07 ENCOUNTER — Inpatient Hospital Stay: Payer: 59 | Attending: Oncology

## 2015-08-07 VITALS — BP 127/82 | HR 81 | Temp 97.4°F | Resp 20

## 2015-08-07 DIAGNOSIS — Z452 Encounter for adjustment and management of vascular access device: Secondary | ICD-10-CM | POA: Insufficient documentation

## 2015-08-07 DIAGNOSIS — Z9221 Personal history of antineoplastic chemotherapy: Secondary | ICD-10-CM | POA: Insufficient documentation

## 2015-08-07 DIAGNOSIS — Z85828 Personal history of other malignant neoplasm of skin: Secondary | ICD-10-CM | POA: Insufficient documentation

## 2015-08-07 DIAGNOSIS — C8338 Diffuse large B-cell lymphoma, lymph nodes of multiple sites: Secondary | ICD-10-CM | POA: Diagnosis not present

## 2015-08-07 DIAGNOSIS — Z95828 Presence of other vascular implants and grafts: Secondary | ICD-10-CM

## 2015-08-07 DIAGNOSIS — Z85820 Personal history of malignant melanoma of skin: Secondary | ICD-10-CM | POA: Insufficient documentation

## 2015-08-07 MED ORDER — SODIUM CHLORIDE 0.9 % IJ SOLN
10.0000 mL | INTRAMUSCULAR | Status: DC | PRN
  Administered 2015-08-07: 10 mL via INTRAVENOUS
  Filled 2015-08-07: qty 10

## 2015-08-07 MED ORDER — HEPARIN SOD (PORK) LOCK FLUSH 100 UNIT/ML IV SOLN
500.0000 [IU] | Freq: Once | INTRAVENOUS | Status: AC
  Administered 2015-08-07: 500 [IU] via INTRAVENOUS

## 2015-08-26 ENCOUNTER — Other Ambulatory Visit: Payer: Self-pay

## 2015-08-26 DIAGNOSIS — I2510 Atherosclerotic heart disease of native coronary artery without angina pectoris: Secondary | ICD-10-CM

## 2015-08-26 DIAGNOSIS — I10 Essential (primary) hypertension: Secondary | ICD-10-CM

## 2015-08-26 DIAGNOSIS — E785 Hyperlipidemia, unspecified: Secondary | ICD-10-CM

## 2015-08-26 MED ORDER — LOSARTAN POTASSIUM 50 MG PO TABS: 50.0000 mg | ORAL_TABLET | Freq: Every day | ORAL | Status: DC

## 2015-08-26 MED ORDER — ROSUVASTATIN CALCIUM 20 MG PO TABS: 20.0000 mg | ORAL_TABLET | Freq: Every day | ORAL | Status: DC

## 2015-08-26 MED ORDER — CLOPIDOGREL BISULFATE 75 MG PO TABS: 75.0000 mg | ORAL_TABLET | Freq: Every day | ORAL | Status: DC

## 2015-08-26 MED ORDER — NIACIN ER (ANTIHYPERLIPIDEMIC) 500 MG PO TBCR: 500.0000 mg | EXTENDED_RELEASE_TABLET | Freq: Every day | ORAL | Status: DC

## 2015-09-12 ENCOUNTER — Other Ambulatory Visit: Payer: 59

## 2015-09-12 ENCOUNTER — Ambulatory Visit: Payer: 59 | Admitting: Oncology

## 2015-09-13 ENCOUNTER — Inpatient Hospital Stay: Payer: 59 | Attending: Oncology

## 2015-09-13 ENCOUNTER — Inpatient Hospital Stay: Payer: 59

## 2015-09-13 DIAGNOSIS — Z95828 Presence of other vascular implants and grafts: Secondary | ICD-10-CM

## 2015-09-13 DIAGNOSIS — C8299 Follicular lymphoma, unspecified, extranodal and solid organ sites: Secondary | ICD-10-CM

## 2015-09-13 DIAGNOSIS — Z79899 Other long term (current) drug therapy: Secondary | ICD-10-CM | POA: Insufficient documentation

## 2015-09-13 DIAGNOSIS — Z85820 Personal history of malignant melanoma of skin: Secondary | ICD-10-CM | POA: Diagnosis not present

## 2015-09-13 DIAGNOSIS — F419 Anxiety disorder, unspecified: Secondary | ICD-10-CM | POA: Diagnosis not present

## 2015-09-13 DIAGNOSIS — Z9221 Personal history of antineoplastic chemotherapy: Secondary | ICD-10-CM | POA: Insufficient documentation

## 2015-09-13 DIAGNOSIS — Z85828 Personal history of other malignant neoplasm of skin: Secondary | ICD-10-CM | POA: Insufficient documentation

## 2015-09-13 DIAGNOSIS — C8338 Diffuse large B-cell lymphoma, lymph nodes of multiple sites: Secondary | ICD-10-CM | POA: Diagnosis not present

## 2015-09-13 DIAGNOSIS — I2510 Atherosclerotic heart disease of native coronary artery without angina pectoris: Secondary | ICD-10-CM | POA: Insufficient documentation

## 2015-09-13 DIAGNOSIS — Z7952 Long term (current) use of systemic steroids: Secondary | ICD-10-CM | POA: Diagnosis not present

## 2015-09-13 DIAGNOSIS — E785 Hyperlipidemia, unspecified: Secondary | ICD-10-CM | POA: Insufficient documentation

## 2015-09-13 DIAGNOSIS — Z7982 Long term (current) use of aspirin: Secondary | ICD-10-CM | POA: Insufficient documentation

## 2015-09-13 LAB — CBC WITH DIFFERENTIAL/PLATELET
BASOS ABS: 0.1 10*3/uL (ref 0–0.1)
Basophils Relative: 1 %
Eosinophils Absolute: 0.1 10*3/uL (ref 0–0.7)
Eosinophils Relative: 1 %
HCT: 38.3 % (ref 35.0–47.0)
Hemoglobin: 13.1 g/dL (ref 12.0–16.0)
LYMPHS PCT: 22 %
Lymphs Abs: 1.7 10*3/uL (ref 1.0–3.6)
MCH: 28.9 pg (ref 26.0–34.0)
MCHC: 34.3 g/dL (ref 32.0–36.0)
MCV: 84.3 fL (ref 80.0–100.0)
MONO ABS: 0.8 10*3/uL (ref 0.2–0.9)
Monocytes Relative: 11 %
Neutro Abs: 5.1 10*3/uL (ref 1.4–6.5)
Neutrophils Relative %: 65 %
Platelets: 196 10*3/uL (ref 150–440)
RBC: 4.54 MIL/uL (ref 3.80–5.20)
RDW: 14.5 % (ref 11.5–14.5)
WBC: 7.7 10*3/uL (ref 3.6–11.0)

## 2015-09-13 LAB — COMPREHENSIVE METABOLIC PANEL
ALT: 19 U/L (ref 14–54)
AST: 22 U/L (ref 15–41)
Albumin: 4.1 g/dL (ref 3.5–5.0)
Alkaline Phosphatase: 62 U/L (ref 38–126)
Anion gap: 7 (ref 5–15)
BILIRUBIN TOTAL: 0.5 mg/dL (ref 0.3–1.2)
BUN: 16 mg/dL (ref 6–20)
CHLORIDE: 103 mmol/L (ref 101–111)
CO2: 25 mmol/L (ref 22–32)
Calcium: 8.9 mg/dL (ref 8.9–10.3)
Creatinine, Ser: 0.76 mg/dL (ref 0.44–1.00)
Glucose, Bld: 103 mg/dL — ABNORMAL HIGH (ref 65–99)
POTASSIUM: 3.3 mmol/L — AB (ref 3.5–5.1)
Sodium: 135 mmol/L (ref 135–145)
TOTAL PROTEIN: 7.2 g/dL (ref 6.5–8.1)

## 2015-09-13 LAB — SEDIMENTATION RATE: SED RATE: 9 mm/h (ref 0–30)

## 2015-09-13 LAB — LACTATE DEHYDROGENASE: LDH: 117 U/L (ref 98–192)

## 2015-09-13 MED ORDER — HEPARIN SOD (PORK) LOCK FLUSH 100 UNIT/ML IV SOLN
500.0000 [IU] | Freq: Once | INTRAVENOUS | Status: AC
  Filled 2015-09-13: qty 5

## 2015-09-13 MED ORDER — SODIUM CHLORIDE 0.9 % IJ SOLN
10.0000 mL | INTRAMUSCULAR | Status: AC | PRN
Start: 2015-09-13 — End: ?
  Filled 2015-09-13: qty 10

## 2015-09-16 ENCOUNTER — Other Ambulatory Visit: Payer: 59

## 2015-09-16 ENCOUNTER — Ambulatory Visit: Payer: 59 | Admitting: Oncology

## 2015-09-18 ENCOUNTER — Encounter: Payer: Self-pay | Admitting: Oncology

## 2015-09-18 ENCOUNTER — Inpatient Hospital Stay (HOSPITAL_BASED_OUTPATIENT_CLINIC_OR_DEPARTMENT_OTHER): Payer: 59 | Admitting: Oncology

## 2015-09-18 ENCOUNTER — Inpatient Hospital Stay: Payer: 59

## 2015-09-18 VITALS — BP 135/93 | HR 78 | Temp 97.7°F | Wt 198.4 lb

## 2015-09-18 DIAGNOSIS — E785 Hyperlipidemia, unspecified: Secondary | ICD-10-CM

## 2015-09-18 DIAGNOSIS — Z9221 Personal history of antineoplastic chemotherapy: Secondary | ICD-10-CM | POA: Diagnosis not present

## 2015-09-18 DIAGNOSIS — Z85828 Personal history of other malignant neoplasm of skin: Secondary | ICD-10-CM

## 2015-09-18 DIAGNOSIS — Z85820 Personal history of malignant melanoma of skin: Secondary | ICD-10-CM | POA: Diagnosis not present

## 2015-09-18 DIAGNOSIS — Z7952 Long term (current) use of systemic steroids: Secondary | ICD-10-CM

## 2015-09-18 DIAGNOSIS — I2510 Atherosclerotic heart disease of native coronary artery without angina pectoris: Secondary | ICD-10-CM

## 2015-09-18 DIAGNOSIS — F419 Anxiety disorder, unspecified: Secondary | ICD-10-CM

## 2015-09-18 DIAGNOSIS — C8338 Diffuse large B-cell lymphoma, lymph nodes of multiple sites: Secondary | ICD-10-CM

## 2015-09-18 DIAGNOSIS — Z7982 Long term (current) use of aspirin: Secondary | ICD-10-CM

## 2015-09-18 DIAGNOSIS — Z79899 Other long term (current) drug therapy: Secondary | ICD-10-CM

## 2015-09-18 DIAGNOSIS — C8330 Diffuse large B-cell lymphoma, unspecified site: Secondary | ICD-10-CM

## 2015-09-18 NOTE — Progress Notes (Signed)
[place] @ [facility] Telephone:(336) [phone]  Fax:(336) [place] OB: 03-15-1951  MR#: [id number]  JME#:[id number]  Patient Care Team: Corey Skains, MD as PCP - General (Internal Medicine)  CHIEF COMPLAINT:  Chief Complaint  Patient presents with  . Lymphoma    Oncology History   1.Marland Kitchenlymph node biopsy from the right axillary lymph node is positive for diffuse B. large cell lymphoma BCL 6 rearrangement positive.MYC negative with activated B cells.  LDH is 333 done at lab-crp  Stage III.  bone marrow biopsy is negative for any involvement with lymphoma She started chemotherapy with R. CHOP in September of 2015.Marland Kitchen PATIENT HAS FINISHED TOTAL 6 CYCLES OF CHEMOTHERAPY WITH r chop (jANUARY, 2016) PET scan shows complete response  Patient had a right medial breast lesion 1.5 cm tumorjunctional and lentiginous dysplastic nevus with mild-to-moderate atypia 3.  Skin from left forearm hypertrophic actinic keratosis.  And superficial basal cell carcinoma.  From multiple area      Diffuse large B cell lymphoma ([place])   02/10/2015 Initial Diagnosis Diffuse large B cell lymphoma    No flowsheet data found.  INTERVAL HISTORY:  Dr. Ronnald Ramp came today further follow-up regarding diffuse large B-cell lymphoma.  No chills.  No fever.  Appetite has been stable.  No nausea.  No vomiting. Patient is here for ongoing evaluation.  No abdominal pain.  No nausea.  No vomiting.  No diarrhea.  No chills or fever.  Patient is here for follow-up regarding lymphoma  REVIEW OF SYSTEMS:   ROS GENERAL:  Feels good.  Active.  No fevers, sweats or weight loss. PERFORMANCE STATUS (ECOG):  0 HEENT:  No visual changes, runny nose, sore throat, mouth sores or tenderness. Lungs: No shortness of breath or cough.  No hemoptysis. Cardiac:  No chest pain, palpitations, orthopnea, or PND. GI:  No nausea, vomiting, diarrhea, constipation, melena or hematochezia. GU:  No urgency, frequency,  dysuria, or hematuria. Musculoskeletal:  No back pain.  No joint pain.  No muscle tenderness. Extremities:  No pain or swelling. Skin:  No rashes or skin changes. Neuro:  No headache, numbness or weakness, balance or coordination issues. Endocrine:  No diabetes, thyroid issues, hot flashes or night sweats. Psych:  No mood changes, depression or anxiety. Pain:  No focal pain. Review of systems:  All other systems reviewed and found to be negative.  As per HPI. Otherwise, a complete review of systems is negatve.  PAST MEDICAL HISTORY: Past Medical History  Diagnosis Date  . Melanoma ([place])   . Lymphoma ([place])   . Anxiety   . Clotting disorder ([place])   . Hyperlipemia   . CAD (coronary artery disease)     PAST SURGICAL HISTORY: Past Surgical History  Procedure Laterality Date  . Gender re-affirmation      FAMILY HISTORY  PFSH: Comments: family history of prostate cancer and thyroid cancer.  Social History: negative alcohol, negative tobacco  Additional Past Medical and Surgical History: coronary artery disease with multiple stent placement  History of multiple surgery for gender re-affirmation.  history of  complex nevus as well as basal cell cancer as described above  surgery for gender reaffirmation       ADVANCED DIRECTIVES: Patient does have advanced health care directive  Advance Directive:  Advance Directive ([place]) yes(1)   Do you want to revise or change your advance directive? No(1)   HEALTH MAINTENANCE: Social History  Substance Use Topics  . Smoking  status: Never Smoker   . Smokeless tobacco: None  . Alcohol Use: No      Current Outpatient Prescriptions  Medication Sig Dispense Refill  . aspirin 81 MG tablet Take 81 mg by mouth daily.    . clopidogrel (PLAVIX) 75 MG tablet Take by mouth.    . clopidogrel (PLAVIX) 75 MG tablet Take 1 tablet (75 mg total) by mouth daily. 90 tablet 1  . estradiol (ESTRACE) 2 MG tablet Take 2 mg by mouth  daily.    . Estradiol 0.75 MG/1.25 GM (0.06%) topical gel Place onto the skin.    Marland Kitchen estradiol valerate (DELESTROGEN) 40 MG/ML injection Inject 40 mg into the muscle every 28 (twenty-eight) days.    . hydrochlorothiazide (HYDRODIURIL) 25 MG tablet Take by mouth.    . loratadine (CLARITIN) 10 MG tablet Take 1 tablet (10 mg total) by mouth daily. 30 tablet 11  . losartan (COZAAR) 50 MG tablet Take 1 tablet (50 mg total) by mouth daily. 90 tablet 1  . nebivolol (BYSTOLIC) 2.5 MG tablet Take 2.5 mg by mouth daily.    . niacin (NIASPAN) 500 MG CR tablet Take 1 tablet (500 mg total) by mouth at bedtime. 90 tablet 1  . olmesartan (BENICAR) 20 MG tablet Take 20 mg by mouth daily.    . ondansetron (ZOFRAN) 4 MG tablet Take 1 tablet (4 mg total) by mouth every 6 (six) hours as needed for nausea or vomiting. 20 tablet 2  . predniSONE (DELTASONE) 10 MG tablet Take 1 tablet (10 mg total) by mouth daily with breakfast. 53 tablet 0  . rosuvastatin (CRESTOR) 20 MG tablet Take 1 tablet (20 mg total) by mouth daily. 90 tablet 1  . rosuvastatin (CRESTOR) 20 MG tablet Take 1 tablet (20 mg total) by mouth daily. 90 tablet 1  . triamcinolone cream (KENALOG) 0.1 % Apply 1 application topically 2 (two) times daily. 30 g 1   No current facility-administered medications for this visit.   Facility-Administered Medications Ordered in Other Visits  Medication Dose Route Frequency Provider Last Rate Last Dose  . heparin lock flush 100 unit/mL  500 Units Intravenous Once Forest Gleason, MD      . sodium chloride 0.9 % injection 10 mL  10 mL Intravenous PRN Forest Gleason, MD        OBJECTIVE: Filed Vitals:   09/18/15 1402  BP: 135/93  Pulse: 78  Temp: 97.7 F (36.5 C)     Body mass index is 27.69 kg/(m^2).    ECOG FS:0 - Asymptomatic  Physical Exam  GENERAL:  Well developed, well nourished, sitting comfortably in the exam room in no acute distress. MENTAL STATUS:  Alert and oriented to person, place and  time.   RESPIRATORY:  Clear to auscultation without rales, wheezes or rhonchi. CARDIOVASCULAR:  Regular rate and rhythm without murmur, rub or gallop. . ABDOMEN:  Soft, non-tender, with active bowel sounds, and no hepatosplenomegaly.  No masses. BACK:  No CVA tenderness.  No tenderness on percussion of the back or rib cage. SKIN:  No rashes, ulcers or lesions. Maculopapular rash in lower extremity most likely consistent with insect bite or cat scratch rash Centers applied steroid as well as Bactroban without any significant relief and will call contact primary physician EXTREMITIES: No edema, no skin discoloration or tenderness.  No palpable cords. LYMPH NODES: No palpable cervical, supraclavicular, axillary or inguinal adenopathy  NEUROLOGICAL: Unremarkable. PSYCH:  Appropriate. LAB RESULTS:    No results found for: SPEP, UPEP  Lab Results  Component Value Date   WBC 7.7 09/13/2015   NEUTROABS 5.1 09/13/2015   HGB 13.1 09/13/2015   HCT 38.3 09/13/2015   MCV 84.3 09/13/2015   PLT 196 09/13/2015      Chemistry            ASSESSMENT: 1.diffuse large  cell lymphoma stage III status post 3 cycles of chemotherapy patient has excellent response on clinical ground. On clinical ground there is no evidence of recurrent disease All lab data has been reviewed. Repeat PET scan in February  Diffuse large B cell lymphoma   Staging form: Lymphoid Neoplasms, AJCC 6th Edition     Clinical: No stage assigned Celine [facility] (December, 2016) was Byars, MD   09/18/2015 2:08 PM

## 2015-09-18 NOTE — Progress Notes (Signed)
Patient states she is having some dysphagia but thinks it is coming from reflux.

## 2015-09-25 ENCOUNTER — Other Ambulatory Visit: Payer: Self-pay

## 2015-09-25 DIAGNOSIS — I10 Essential (primary) hypertension: Secondary | ICD-10-CM

## 2015-09-25 DIAGNOSIS — J329 Chronic sinusitis, unspecified: Secondary | ICD-10-CM

## 2015-09-25 MED ORDER — AZITHROMYCIN 250 MG PO TABS: ORAL_TABLET | ORAL | Status: DC

## 2015-09-25 MED ORDER — HYDROCHLOROTHIAZIDE 25 MG PO TABS: 25.0000 mg | ORAL_TABLET | Freq: Every day | ORAL | Status: DC

## 2015-10-06 DIAGNOSIS — Z9221 Personal history of antineoplastic chemotherapy: Secondary | ICD-10-CM

## 2015-10-06 HISTORY — DX: Personal history of antineoplastic chemotherapy: Z92.21

## 2015-10-28 ENCOUNTER — Other Ambulatory Visit: Payer: Self-pay | Admitting: Internal Medicine

## 2015-10-28 DIAGNOSIS — R1310 Dysphagia, unspecified: Secondary | ICD-10-CM

## 2015-10-30 ENCOUNTER — Inpatient Hospital Stay: Payer: 59 | Attending: Oncology

## 2015-10-30 DIAGNOSIS — Z85828 Personal history of other malignant neoplasm of skin: Secondary | ICD-10-CM | POA: Diagnosis not present

## 2015-10-30 DIAGNOSIS — C8338 Diffuse large B-cell lymphoma, lymph nodes of multiple sites: Secondary | ICD-10-CM | POA: Diagnosis not present

## 2015-10-30 DIAGNOSIS — L309 Dermatitis, unspecified: Secondary | ICD-10-CM | POA: Diagnosis not present

## 2015-10-30 DIAGNOSIS — Z452 Encounter for adjustment and management of vascular access device: Secondary | ICD-10-CM | POA: Diagnosis not present

## 2015-10-30 DIAGNOSIS — Z9221 Personal history of antineoplastic chemotherapy: Secondary | ICD-10-CM | POA: Insufficient documentation

## 2015-10-30 DIAGNOSIS — L570 Actinic keratosis: Secondary | ICD-10-CM | POA: Diagnosis not present

## 2015-10-30 DIAGNOSIS — Z95828 Presence of other vascular implants and grafts: Secondary | ICD-10-CM

## 2015-10-30 DIAGNOSIS — L578 Other skin changes due to chronic exposure to nonionizing radiation: Secondary | ICD-10-CM | POA: Diagnosis not present

## 2015-10-30 MED ORDER — HEPARIN SOD (PORK) LOCK FLUSH 100 UNIT/ML IV SOLN
INTRAVENOUS | Status: AC
  Filled 2015-10-30: qty 5

## 2015-10-30 MED ORDER — HEPARIN SOD (PORK) LOCK FLUSH 100 UNIT/ML IV SOLN
500.0000 [IU] | Freq: Once | INTRAVENOUS | Status: AC
  Administered 2015-10-30: 500 [IU] via INTRAVENOUS

## 2015-10-30 MED ORDER — SODIUM CHLORIDE 0.9% FLUSH
10.0000 mL | INTRAVENOUS | Status: DC | PRN
  Administered 2015-10-30: 10 mL via INTRAVENOUS
  Filled 2015-10-30: qty 10

## 2015-11-01 ENCOUNTER — Ambulatory Visit
Admission: RE | Admit: 2015-11-01 | Discharge: 2015-11-01 | Disposition: A | Discharge status: Home / Self Care | Payer: 59 | Source: Ambulatory Visit | Attending: Internal Medicine | Admitting: Internal Medicine

## 2015-11-01 DIAGNOSIS — Z8572 Personal history of non-Hodgkin lymphomas: Secondary | ICD-10-CM | POA: Insufficient documentation

## 2015-11-01 DIAGNOSIS — R1310 Dysphagia, unspecified: Secondary | ICD-10-CM | POA: Diagnosis not present

## 2015-11-01 DIAGNOSIS — K219 Gastro-esophageal reflux disease without esophagitis: Secondary | ICD-10-CM | POA: Diagnosis not present

## 2015-11-01 DIAGNOSIS — K449 Diaphragmatic hernia without obstruction or gangrene: Secondary | ICD-10-CM | POA: Diagnosis not present

## 2015-11-06 ENCOUNTER — Other Ambulatory Visit: Payer: 59

## 2015-11-18 ENCOUNTER — Other Ambulatory Visit: Payer: Self-pay

## 2015-11-18 ENCOUNTER — Encounter: Payer: Self-pay | Admitting: *Deleted

## 2015-11-20 DIAGNOSIS — R21 Rash and other nonspecific skin eruption: Secondary | ICD-10-CM | POA: Diagnosis not present

## 2015-11-20 DIAGNOSIS — L509 Urticaria, unspecified: Secondary | ICD-10-CM | POA: Diagnosis not present

## 2015-11-20 DIAGNOSIS — L500 Allergic urticaria: Secondary | ICD-10-CM | POA: Diagnosis not present

## 2015-11-20 NOTE — Discharge Instructions (Signed)

## 2015-11-22 ENCOUNTER — Ambulatory Visit
Admission: RE | Admit: 2015-11-22 | Discharge: 2015-11-22 | Disposition: A | Discharge status: Home / Self Care | Payer: 59 | Source: Ambulatory Visit | Attending: Gastroenterology | Admitting: Gastroenterology

## 2015-11-22 ENCOUNTER — Ambulatory Visit: Payer: 59 | Admitting: Anesthesiology

## 2015-11-22 ENCOUNTER — Encounter
Admission: RE | Disposition: A | Discharge status: Home / Self Care | Payer: Self-pay | Source: Ambulatory Visit | Attending: Gastroenterology

## 2015-11-22 DIAGNOSIS — Z8572 Personal history of non-Hodgkin lymphomas: Secondary | ICD-10-CM | POA: Insufficient documentation

## 2015-11-22 DIAGNOSIS — Z9889 Other specified postprocedural states: Secondary | ICD-10-CM | POA: Diagnosis not present

## 2015-11-22 DIAGNOSIS — Z7982 Long term (current) use of aspirin: Secondary | ICD-10-CM | POA: Diagnosis not present

## 2015-11-22 DIAGNOSIS — R1310 Dysphagia, unspecified: Secondary | ICD-10-CM | POA: Diagnosis not present

## 2015-11-22 DIAGNOSIS — K219 Gastro-esophageal reflux disease without esophagitis: Secondary | ICD-10-CM | POA: Insufficient documentation

## 2015-11-22 DIAGNOSIS — I2510 Atherosclerotic heart disease of native coronary artery without angina pectoris: Secondary | ICD-10-CM | POA: Diagnosis not present

## 2015-11-22 DIAGNOSIS — Z1211 Encounter for screening for malignant neoplasm of colon: Secondary | ICD-10-CM | POA: Diagnosis not present

## 2015-11-22 DIAGNOSIS — K2970 Gastritis, unspecified, without bleeding: Secondary | ICD-10-CM | POA: Diagnosis not present

## 2015-11-22 DIAGNOSIS — K319 Disease of stomach and duodenum, unspecified: Secondary | ICD-10-CM | POA: Diagnosis not present

## 2015-11-22 DIAGNOSIS — Z79899 Other long term (current) drug therapy: Secondary | ICD-10-CM | POA: Diagnosis not present

## 2015-11-22 DIAGNOSIS — D122 Benign neoplasm of ascending colon: Secondary | ICD-10-CM | POA: Diagnosis not present

## 2015-11-22 DIAGNOSIS — Z87890 Personal history of sex reassignment: Secondary | ICD-10-CM | POA: Diagnosis not present

## 2015-11-22 DIAGNOSIS — Z955 Presence of coronary angioplasty implant and graft: Secondary | ICD-10-CM | POA: Insufficient documentation

## 2015-11-22 DIAGNOSIS — E785 Hyperlipidemia, unspecified: Secondary | ICD-10-CM | POA: Diagnosis not present

## 2015-11-22 DIAGNOSIS — K2980 Duodenitis without bleeding: Secondary | ICD-10-CM | POA: Diagnosis not present

## 2015-11-22 DIAGNOSIS — K449 Diaphragmatic hernia without obstruction or gangrene: Secondary | ICD-10-CM | POA: Diagnosis not present

## 2015-11-22 DIAGNOSIS — Z85820 Personal history of malignant melanoma of skin: Secondary | ICD-10-CM | POA: Insufficient documentation

## 2015-11-22 DIAGNOSIS — K222 Esophageal obstruction: Secondary | ICD-10-CM | POA: Diagnosis not present

## 2015-11-22 DIAGNOSIS — Z7902 Long term (current) use of antithrombotics/antiplatelets: Secondary | ICD-10-CM | POA: Diagnosis not present

## 2015-11-22 DIAGNOSIS — I10 Essential (primary) hypertension: Secondary | ICD-10-CM | POA: Diagnosis not present

## 2015-11-22 DIAGNOSIS — K3189 Other diseases of stomach and duodenum: Secondary | ICD-10-CM | POA: Diagnosis not present

## 2015-11-22 HISTORY — DX: Gastro-esophageal reflux disease without esophagitis: K21.9

## 2015-11-22 HISTORY — PX: ESOPHAGOGASTRODUODENOSCOPY (EGD) WITH PROPOFOL: SHX5813

## 2015-11-22 HISTORY — DX: Essential (primary) hypertension: I10

## 2015-11-22 SURGERY — ESOPHAGOGASTRODUODENOSCOPY (EGD) WITH PROPOFOL
Anesthesia: Monitor Anesthesia Care

## 2015-11-22 MED ORDER — PROPOFOL 10 MG/ML IV BOLUS
INTRAVENOUS | Status: DC | PRN
  Administered 2015-11-22: 40 mg via INTRAVENOUS
  Administered 2015-11-22: 80 mg via INTRAVENOUS
  Administered 2015-11-22: 100 mg via INTRAVENOUS

## 2015-11-22 MED ORDER — OXYCODONE HCL 5 MG PO TABS: 5.0000 mg | ORAL_TABLET | Freq: Once | ORAL | Status: DC | PRN

## 2015-11-22 MED ORDER — OXYCODONE HCL 5 MG/5ML PO SOLN: 5.0000 mg | Freq: Once | ORAL | Status: DC | PRN

## 2015-11-22 MED ORDER — LIDOCAINE HCL (CARDIAC) 20 MG/ML IV SOLN
INTRAVENOUS | Status: DC | PRN
  Administered 2015-11-22: 30 mg via INTRAVENOUS

## 2015-11-22 MED ORDER — GLYCOPYRROLATE 0.2 MG/ML IJ SOLN
INTRAMUSCULAR | Status: DC | PRN
  Administered 2015-11-22: 0.2 mg via INTRAVENOUS

## 2015-11-22 MED ORDER — LACTATED RINGERS IV SOLN
INTRAVENOUS | Status: DC
  Administered 2015-11-22: 07:00:00 via INTRAVENOUS

## 2015-11-22 SURGICAL SUPPLY — 39 items
BALLN DILATOR 10-12 8 (BALLOONS)
BALLN DILATOR 12-15 8 (BALLOONS) ×3
BALLN DILATOR 15-18 8 (BALLOONS)
BALLN DILATOR CRE 0-12 8 (BALLOONS)
BALLN DILATOR ESOPH 8 10 CRE (MISCELLANEOUS) IMPLANT
BALLOON DILATOR 12-15 8 (BALLOONS) ×1 IMPLANT
BALLOON DILATOR 15-18 8 (BALLOONS) IMPLANT
BALLOON DILATOR CRE 0-12 8 (BALLOONS) IMPLANT
BLOCK BITE 60FR ADLT L/F GRN (MISCELLANEOUS) ×3 IMPLANT
CANISTER SUCT 1200ML W/VALVE (MISCELLANEOUS) ×3 IMPLANT
FCP ESCP3.2XJMB 240X2.8X (MISCELLANEOUS) ×1
FORCEPS BIOP RAD 4 LRG CAP 4 (CUTTING FORCEPS) IMPLANT
FORCEPS BIOP RJ4 240 W/NDL (MISCELLANEOUS) ×2
FORCEPS ESCP3.2XJMB 240X2.8X (MISCELLANEOUS) ×1 IMPLANT
GOWN CVR UNV OPN BCK APRN NK (MISCELLANEOUS) ×2 IMPLANT
GOWN ISOL THUMB LOOP REG UNIV (MISCELLANEOUS) ×4
HEMOCLIP INSTINCT (CLIP) IMPLANT
INJECTOR VARIJECT VIN23 (MISCELLANEOUS) IMPLANT
KIT CO2 TUBING (TUBING) IMPLANT
KIT DEFENDO VALVE AND CONN (KITS) IMPLANT
KIT ENDO PROCEDURE OLY (KITS) ×3 IMPLANT
LIGATOR MULTIBAND 6SHOOTER MBL (MISCELLANEOUS) IMPLANT
MARKER SPOT ENDO TATTOO 5ML (MISCELLANEOUS) IMPLANT
PAD GROUND ADULT SPLIT (MISCELLANEOUS) IMPLANT
SNARE SHORT THROW 13M SML OVAL (MISCELLANEOUS) IMPLANT
SNARE SHORT THROW 30M LRG OVAL (MISCELLANEOUS) IMPLANT
SPOT EX ENDOSCOPIC TATTOO (MISCELLANEOUS)
SUCTION POLY TRAP 4CHAMBER (MISCELLANEOUS) IMPLANT
SYR INFLATION 60ML (SYRINGE) IMPLANT
TRAP SUCTION POLY (MISCELLANEOUS) IMPLANT
TUBING CONN 6MMX3.1M (TUBING)
TUBING SUCTION CONN 0.25 STRL (TUBING) IMPLANT
UNDERPAD 30X60 958B10 (PK) (MISCELLANEOUS) IMPLANT
VALVE BIOPSY ENDO (VALVE) IMPLANT
VARIJECT INJECTOR VIN23 (MISCELLANEOUS)
WATER AUXILLARY (MISCELLANEOUS) IMPLANT
WATER STERILE IRR 250ML POUR (IV SOLUTION) ×3 IMPLANT
WATER STERILE IRR 500ML POUR (IV SOLUTION) IMPLANT
WIRE CRE 18-20MM 8CM F G (MISCELLANEOUS) IMPLANT

## 2015-11-22 NOTE — Anesthesia Postprocedure Evaluation (Signed)
Anesthesia Post Note  Patient: Kendra Salas  Procedure(s) Performed: Procedure(s) (LRB): ESOPHAGOGASTRODUODENOSCOPY (EGD) WITH PROPOFOL (N/A)  Patient location during evaluation: PACU Anesthesia Type: MAC Level of consciousness: awake and alert Pain management: pain level controlled Vital Signs Assessment: post-procedure vital signs reviewed and stable Respiratory status: spontaneous breathing, nonlabored ventilation, respiratory function stable and patient connected to nasal cannula oxygen Cardiovascular status: blood pressure returned to baseline and stable Postop Assessment: no signs of nausea or vomiting Anesthetic complications: no    Ewel Lona

## 2015-11-22 NOTE — Op Note (Signed)
[facility] Patient Name: Tenile Ulery Procedure Date: 11/22/2015 7:27 AM MRN: [id number] Account #: [id number] Date of Birth: Nov 19, 1950 Admit Type: Outpatient Age: 65 Room: [facility] OR ROOM 01 Gender: Female Note Status: Finalized Procedure:            Upper GI endoscopy Indications:          Dysphagia Providers:            Lucilla Lame, MD Referring MD:         Corey Skains (int Med), MD (Referring MD) Medicines:            Propofol per Anesthesia Complications:        No immediate complications. Procedure:            Pre-Anesthesia Assessment:                       - Prior to the procedure, a History and Physical was                        performed, and patient medications and allergies were                        reviewed. The patient's tolerance of previous                        anesthesia was also reviewed. The risks and benefits of                        the procedure and the sedation options and risks were                        discussed with the patient. All questions were                        answered, and informed consent was obtained. Prior                        Anticoagulants: The patient has taken no previous                        anticoagulant or antiplatelet agents. ASA Grade                        Assessment: II - A patient with mild systemic disease.                        After reviewing the risks and benefits, the patient was                        deemed in satisfactory condition to undergo the                        procedure.                       After obtaining informed consent, the endoscope was                        passed under direct vision. Throughout the procedure,  the patient's blood pressure, pulse, and oxygen                        saturations were monitored continuously. The was                        introduced through the mouth, and advanced to the                        second  part of duodenum. The upper GI endoscopy was                        accomplished without difficulty. The patient tolerated                        the procedure well. Findings:      A 5 cm hiatal hernia was present.      One mild benign-appearing, intrinsic stenosis was found at the       gastroesophageal junction. And was traversed. A TTS dilator was passed       through the scope. Dilation with a 15-16.5-18 mm balloon dilator was       performed.      Localized mild inflammation characterized by erythema was found in the       gastric antrum. Biopsies were taken with a cold forceps for histology.      Localized mild inflammation was found in the duodenal bulb. Impression:           - 5 cm hiatal hernia.                       - Benign-appearing esophageal stenosis. Dilated.                       - Gastritis. Biopsied.                       - Duodenitis. Recommendation:       - Await pathology results. Procedure Code(s):    --- Professional ---                       [phone], Esophagogastroduodenoscopy, flexible, transoral;                        with transendoscopic balloon dilation of esophagus                        (less than 30 mm diameter)                       43239, Esophagogastroduodenoscopy, flexible, transoral;                        with biopsy, single or multiple Diagnosis Code(s):    --- Professional ---                       R13.10, Dysphagia, unspecified                       K22.2, Esophageal obstruction                       K29.70, Gastritis, unspecified, without  bleeding                       K29.80, Duodenitis without bleeding                       K44.9, Diaphragmatic hernia without obstruction or                        gangrene CPT copyright 2016 American Medical Association. All rights reserved. The codes documented in this report are preliminary and upon coder review may  be revised to meet current compliance requirements. Lucilla Lame, MD 11/22/2015 7:45:13  AM This report has been signed electronically. Number of Addenda: 0 Note Initiated On: 11/22/2015 7:27 AM      [facility]

## 2015-11-22 NOTE — H&P (Signed)
[facility]  [address], [place], [place] [address] Phone: [phone] Fax : [phone]  Primary Care Physician:  Halina Maidens, MD Primary Gastroenterologist:  Dr. Allen Norris  Pre-Procedure History & Physical: HPI:  Kendra Salas is a 65 y.o. female is here for an endoscopy.   Past Medical History  Diagnosis Date  . Anxiety   . Clotting disorder ([place])   . Hyperlipemia   . CAD (coronary artery disease)   . Hypertension   . GERD (gastroesophageal reflux disease)   . Melanoma ([place])   . Lymphoma [facility])     Past Surgical History  Procedure Laterality Date  . Gender re-affirmation    . Hernia repair      x2  . Cardiac catheterization      X2 - has 5 stents. 4 LAD, 1 posterior, Last 3 placed 2004  . Colonoscopy    . Facial cosmetic surgery      Prior to Admission medications   Medication Sig Start Date End Date Taking? Authorizing Provider  aspirin 81 MG tablet Take 81 mg by mouth daily.   Yes Historical Provider, MD  clopidogrel (PLAVIX) 75 MG tablet Take 1 tablet (75 mg total) by mouth daily. 08/26/15  Yes [name], MD  dexlansoprazole (DEXILANT) 60 MG capsule Take 60 mg by mouth daily.   Yes Historical Provider, MD  estrogens, conjugated, (PREMARIN) 0.3 MG tablet Take 0.3 mg by mouth daily.   Yes Historical Provider, MD  loratadine (CLARITIN) 10 MG tablet Take 1 tablet (10 mg total) by mouth daily. Patient taking differently: Take 10 mg by mouth daily as needed.  06/11/15  Yes [name], MD  losartan (COZAAR) 50 MG tablet Take 1 tablet (50 mg total) by mouth daily. 08/26/15  Yes [name], MD  nebivolol (BYSTOLIC) 2.5 MG tablet Take 2.5 mg by mouth daily.   Yes Historical Provider, MD  niacin (NIASPAN) 500 MG CR tablet Take 1 tablet (500 mg total) by mouth at bedtime. 08/26/15  Yes [name], MD  rosuvastatin (CRESTOR) 20 MG tablet Take 1 tablet (20 mg total) by mouth daily. 08/26/15  Yes [name], MD  triamcinolone cream  (KENALOG) 0.1 % Apply 1 application topically 2 (two) times daily. 05/28/15  Yes [name], MD    Allergies as of 11/18/2015  . (No Known Allergies)    History reviewed. No pertinent family history.  Social History   Social History  . Marital Status: Married    Spouse Name: N/A  . Number of Children: N/A  . Years of Education: N/A   Occupational History  . Not on file.   Social History Main Topics  . Smoking status: Never Smoker   . Smokeless tobacco: Not on file  . Alcohol Use: 0.6 oz/week    1 Shots of liquor per week  . Drug Use: Not on file  . Sexual Activity: Not on file   Other Topics Concern  . Not on file   Social History Narrative    Review of Systems: See HPI, otherwise negative ROS  Physical Exam: BP 117/85 mmHg  Pulse 69  Temp(Src) 97.9 F (36.6 C) (Temporal)  Resp 18  Ht 5\' 11"  (1.803 m)  Wt 196 lb (88.905 kg)  BMI 27.35 kg/m2  SpO2 98% General:   Alert,  pleasant and cooperative in NAD Head:  Normocephalic and atraumatic. Neck:  Supple; no masses or thyromegaly. Lungs:  Clear throughout to auscultation.  Heart:  Regular rate and rhythm. Abdomen:  Soft, nontender and nondistended. Normal bowel sounds, without guarding, and without rebound.   Neurologic:  Alert and  oriented x4;  grossly normal neurologically.  Impression/Plan: Kendra Salas is here for an endoscopy to be performed for dysphagia  Risks, benefits, limitations, and alternatives regarding  endoscopy have been reviewed with the patient.  Questions have been answered.  All parties agreeable.   Ollen Bowl, MD  11/22/2015, 7:12 AM

## 2015-11-22 NOTE — Anesthesia Preprocedure Evaluation (Signed)
Anesthesia Evaluation  Patient identified by MRN, date of birth, ID band  Reviewed: NPO status   History of Anesthesia Complications Negative for: history of anesthetic complications  Airway Mallampati: II  TM Distance: >3 FB Neck ROM: full    Dental no notable dental hx.    Pulmonary neg pulmonary ROS,    Pulmonary exam normal        Cardiovascular Exercise Tolerance: Good hypertension, + CAD and + Cardiac Stents (x5 2001; last plavix 5 days ago)  negative cardio ROS Normal cardiovascular exam     Neuro/Psych negative neurological ROS  negative psych ROS   GI/Hepatic Neg liver ROS, GERD  ,  Endo/Other  negative endocrine ROS  Renal/GU negative Renal ROS  negative genitourinary   Musculoskeletal   Abdominal   Peds  Hematology NHLymphoma    Anesthesia Other Findings   Reproductive/Obstetrics                             Anesthesia Physical Anesthesia Plan  ASA: II  Anesthesia Plan: MAC   Post-op Pain Management:    Induction:   Airway Management Planned:   Additional Equipment:   Intra-op Plan:   Post-operative Plan:   Informed Consent: I have reviewed the patients History and Physical, chart, labs and discussed the procedure including the risks, benefits and alternatives for the proposed anesthesia with the patient or authorized representative who has indicated his/her understanding and acceptance.     Plan Discussed with: CRNA  Anesthesia Plan Comments:         Anesthesia Quick Evaluation

## 2015-11-22 NOTE — Anesthesia Procedure Notes (Signed)
Procedure Name: MAC Performed by: Maymuna Detzel Pre-anesthesia Checklist: Patient identified, Emergency Drugs available, Suction available, Patient being monitored and Timeout performed Patient Re-evaluated:Patient Re-evaluated prior to inductionOxygen Delivery Method: Nasal cannula       

## 2015-11-22 NOTE — Transfer of Care (Signed)
Immediate Anesthesia Transfer of Care Note  Patient: Kendra Salas  Procedure(s) Performed: Procedure(s): ESOPHAGOGASTRODUODENOSCOPY (EGD) WITH PROPOFOL (N/A)  Patient Location: PACU  Anesthesia Type: MAC  Level of Consciousness: awake, alert  and patient cooperative  Airway and Oxygen Therapy: Patient Spontanous Breathing and Patient connected to supplemental oxygen  Post-op Assessment: Post-op Vital signs reviewed, Patient's Cardiovascular Status Stable, Respiratory Function Stable, Patent Airway and No signs of Nausea or vomiting  Post-op Vital Signs: Reviewed and stable  Complications: No apparent anesthesia complications

## 2015-11-25 ENCOUNTER — Encounter: Payer: Self-pay | Admitting: Gastroenterology

## 2015-11-27 DIAGNOSIS — L981 Factitial dermatitis: Secondary | ICD-10-CM | POA: Diagnosis not present

## 2015-11-27 DIAGNOSIS — L500 Allergic urticaria: Secondary | ICD-10-CM | POA: Diagnosis not present

## 2015-11-27 DIAGNOSIS — R21 Rash and other nonspecific skin eruption: Secondary | ICD-10-CM | POA: Diagnosis not present

## 2015-12-03 ENCOUNTER — Encounter
Admission: RE | Admit: 2015-12-03 | Discharge: 2015-12-03 | Disposition: A | Discharge status: Home / Self Care | Payer: 59 | Source: Ambulatory Visit | Attending: Oncology | Admitting: Oncology

## 2015-12-03 DIAGNOSIS — C8330 Diffuse large B-cell lymphoma, unspecified site: Secondary | ICD-10-CM | POA: Insufficient documentation

## 2015-12-03 LAB — GLUCOSE, CAPILLARY: GLUCOSE-CAPILLARY: 83 mg/dL (ref 65–99)

## 2015-12-03 MED ORDER — FLUDEOXYGLUCOSE F - 18 (FDG) INJECTION: 11.6300 | Freq: Once | INTRAVENOUS | Status: DC | PRN

## 2015-12-04 ENCOUNTER — Inpatient Hospital Stay (HOSPITAL_BASED_OUTPATIENT_CLINIC_OR_DEPARTMENT_OTHER): Payer: 59 | Admitting: Oncology

## 2015-12-04 ENCOUNTER — Inpatient Hospital Stay: Payer: 59 | Attending: Oncology

## 2015-12-04 ENCOUNTER — Encounter: Payer: Self-pay | Admitting: Oncology

## 2015-12-04 VITALS — BP 126/87 | HR 80 | Temp 97.4°F | Resp 18 | Wt 198.9 lb

## 2015-12-04 DIAGNOSIS — Z7982 Long term (current) use of aspirin: Secondary | ICD-10-CM | POA: Insufficient documentation

## 2015-12-04 DIAGNOSIS — I10 Essential (primary) hypertension: Secondary | ICD-10-CM | POA: Insufficient documentation

## 2015-12-04 DIAGNOSIS — Z85828 Personal history of other malignant neoplasm of skin: Secondary | ICD-10-CM

## 2015-12-04 DIAGNOSIS — I2510 Atherosclerotic heart disease of native coronary artery without angina pectoris: Secondary | ICD-10-CM

## 2015-12-04 DIAGNOSIS — E785 Hyperlipidemia, unspecified: Secondary | ICD-10-CM

## 2015-12-04 DIAGNOSIS — C8338 Diffuse large B-cell lymphoma, lymph nodes of multiple sites: Secondary | ICD-10-CM

## 2015-12-04 DIAGNOSIS — Z9221 Personal history of antineoplastic chemotherapy: Secondary | ICD-10-CM

## 2015-12-04 DIAGNOSIS — Z79899 Other long term (current) drug therapy: Secondary | ICD-10-CM | POA: Diagnosis not present

## 2015-12-04 DIAGNOSIS — Z452 Encounter for adjustment and management of vascular access device: Secondary | ICD-10-CM | POA: Diagnosis not present

## 2015-12-04 DIAGNOSIS — K219 Gastro-esophageal reflux disease without esophagitis: Secondary | ICD-10-CM | POA: Diagnosis not present

## 2015-12-04 DIAGNOSIS — C8330 Diffuse large B-cell lymphoma, unspecified site: Secondary | ICD-10-CM

## 2015-12-04 DIAGNOSIS — Z85820 Personal history of malignant melanoma of skin: Secondary | ICD-10-CM | POA: Insufficient documentation

## 2015-12-04 LAB — CBC WITH DIFFERENTIAL/PLATELET
Basophils Absolute: 0 10*3/uL (ref 0–0.1)
Basophils Relative: 1 %
EOS PCT: 2 %
Eosinophils Absolute: 0.1 10*3/uL (ref 0–0.7)
HCT: 38.4 % (ref 35.0–47.0)
Hemoglobin: 13.2 g/dL (ref 12.0–16.0)
Lymphocytes Relative: 28 %
Lymphs Abs: 1.9 10*3/uL (ref 1.0–3.6)
MCH: 28.2 pg (ref 26.0–34.0)
MCHC: 34.4 g/dL (ref 32.0–36.0)
MCV: 82 fL (ref 80.0–100.0)
MONO ABS: 0.9 10*3/uL (ref 0.2–0.9)
MONOS PCT: 13 %
NEUTROS ABS: 3.8 10*3/uL (ref 1.4–6.5)
NEUTROS PCT: 56 %
PLATELETS: 228 10*3/uL (ref 150–440)
RBC: 4.68 MIL/uL (ref 3.80–5.20)
RDW: 14.6 % — ABNORMAL HIGH (ref 11.5–14.5)
WBC: 6.7 10*3/uL (ref 3.6–11.0)

## 2015-12-04 LAB — COMPREHENSIVE METABOLIC PANEL
ALK PHOS: 71 U/L (ref 38–126)
ALT: 18 U/L (ref 14–54)
AST: 18 U/L (ref 15–41)
Albumin: 4 g/dL (ref 3.5–5.0)
Anion gap: 6 (ref 5–15)
BILIRUBIN TOTAL: 0.8 mg/dL (ref 0.3–1.2)
BUN: 16 mg/dL (ref 6–20)
CALCIUM: 8.4 mg/dL — AB (ref 8.9–10.3)
CHLORIDE: 103 mmol/L (ref 101–111)
CO2: 25 mmol/L (ref 22–32)
CREATININE: 0.9 mg/dL (ref 0.44–1.00)
Glucose, Bld: 104 mg/dL — ABNORMAL HIGH (ref 65–99)
Potassium: 3.4 mmol/L — ABNORMAL LOW (ref 3.5–5.1)
Sodium: 134 mmol/L — ABNORMAL LOW (ref 135–145)
TOTAL PROTEIN: 7.3 g/dL (ref 6.5–8.1)

## 2015-12-04 LAB — LACTATE DEHYDROGENASE: LDH: 119 U/L (ref 98–192)

## 2015-12-04 LAB — SEDIMENTATION RATE: SED RATE: 15 mm/h (ref 0–30)

## 2015-12-05 ENCOUNTER — Ambulatory Visit: Payer: 59 | Admitting: Oncology

## 2015-12-05 ENCOUNTER — Other Ambulatory Visit: Payer: 59

## 2015-12-06 ENCOUNTER — Other Ambulatory Visit: Payer: Self-pay

## 2015-12-06 MED ORDER — MUPIROCIN 2 % EX OINT
1.0000 | TOPICAL_OINTMENT | Freq: Two times a day (BID) | CUTANEOUS | Status: DC
Start: 2015-12-06 — End: 2016-02-12

## 2015-12-08 ENCOUNTER — Encounter: Payer: Self-pay | Admitting: Oncology

## 2015-12-08 NOTE — Progress Notes (Signed)
After scanning records, I do not see that she received zoster inj. I checked with L Herring AGNP regarding whether she could receive it without problems and she said there should not be a problems with her getting it since she has completed therapy. Patient was notified of this. Sh estated that she had gotten the TDAP and Prevnar 13 immunizations on her 1st or 2nd cycle of chemo  [place] @ [facility] Telephone:(336) [phone]  Fax:(336) [place] OB: 08-01-1951  MR#: [id number]  BMW#:[id number]  Patient Care Team: Glean Hess, MD as PCP - General (Family Medicine)  CHIEF COMPLAINT:  Chief Complaint  Patient presents with  . Diffuse large B-cell lymphoma    Oncology History   1.Marland Kitchenlymph node biopsy from the right axillary lymph node is positive for diffuse B. large cell lymphoma BCL 6 rearrangement positive.MYC negative with activated B cells.  LDH is 333 done at lab-crp  Stage III.  bone marrow biopsy is negative for any involvement with lymphoma She started chemotherapy with R. CHOP in September of 2015.Marland Kitchen PATIENT HAS FINISHED TOTAL 6 CYCLES OF CHEMOTHERAPY WITH r chop (jANUARY, 2016) PET scan shows complete response  Patient had a right medial breast lesion 1.5 cm tumorjunctional and lentiginous dysplastic nevus with mild-to-moderate atypia 3.  Skin from left forearm hypertrophic actinic keratosis.  And superficial basal cell carcinoma.  From multiple area      Diffuse large B cell lymphoma ([place])   02/10/2015 Initial Diagnosis Diffuse large B cell lymphoma    No flowsheet data found.  INTERVAL HISTORY:  Dr. Ronnald Ramp came today further follow-up regarding diffuse large B-cell lymphoma.  No chills.  No fever.  Appetite has been stable.  No nausea.  No vomiting. C was concerned about a lymph node in the right submandibular area May 13, 2015 Patient is here for ongoing evaluation and treatment consideration Patient had a PET scan done.  Since last evaluation has  not noticed any palpable lymph node.  No chills.  No fever.  Working full-time. Appetite has been stable. Patient is here to discuss the results of the PET scan and further evaluation. Had some difficulty swallowing anD SHE underwent upper endoscopy no abnormality detected there was esophageal stricture which has been dilated REVIEW OF SYSTEMS:   ROS GENERAL:  Feels good.  Active.  No fevers, sweats or weight loss. PERFORMANCE STATUS (ECOG):  0 HEENT:  No visual changes, runny nose, sore throat, mouth sores or tenderness. Lungs: No shortness of breath or cough.  No hemoptysis. Cardiac:  No chest pain, palpitations, orthopnea, or PND. GI:  No nausea, vomiting, diarrhea, constipation, melena or hematochezia. GU:  No urgency, frequency, dysuria, or hematuria. Musculoskeletal:  No back pain.  No joint pain.  No muscle tenderness. Extremities:  No pain or swelling. Skin:  No rashes or skin changes. Neuro:  No headache, numbness or weakness, balance or coordination issues. Endocrine:  No diabetes, thyroid issues, hot flashes or night sweats. Psych:  No mood changes, depression or anxiety. Pain:  No focal pain. Review of systems:  All other systems reviewed and found to be negative.  As per HPI. Otherwise, a complete review of systems is negatve.  PAST MEDICAL HISTORY: Past Medical History  Diagnosis Date  . Anxiety   . Clotting disorder ([place])   . Hyperlipemia   . CAD (coronary artery disease)   . Hypertension   . GERD (gastroesophageal reflux disease)   . Melanoma ([place])   .  Lymphoma ([place])     PAST SURGICAL HISTORY: Past Surgical History  Procedure Laterality Date  . Gender re-affirmation    . Hernia repair      x2  . Cardiac catheterization      X2 - has 5 stents. 4 LAD, 1 posterior, Last 3 placed 2004  . Colonoscopy    . Facial cosmetic surgery    . Esophagogastroduodenoscopy (egd) with propofol N/A 11/22/2015    Procedure: ESOPHAGOGASTRODUODENOSCOPY (EGD) WITH PROPOFOL;   Surgeon: Lucilla Lame, MD;  Location: [facility];  Service: Endoscopy;  Laterality: N/A;    FAMILY HISTORY  PFSH: Comments: family history of prostate cancer and thyroid cancer.  Social History: negative alcohol, negative tobacco  Additional Past Medical and Surgical History: coronary artery disease with multiple stent placement  History of multiple surgery for gender re-affirmation.  history of  complex nevus as well as basal cell cancer as described above  surgery for gender reaffirmation       ADVANCED DIRECTIVES: Patient does have advanced health care directive  Advance Directive:  Advance Directive ([place]) yes(1)   Do you want to revise or change your advance directive? No(1)   HEALTH MAINTENANCE: Social History  Substance Use Topics  . Smoking status: Never Smoker   . Smokeless tobacco: None  . Alcohol Use: 0.6 oz/week    1 Shots of liquor per week      Current Outpatient Prescriptions  Medication Sig Dispense Refill  . aspirin 81 MG tablet Take 81 mg by mouth daily.    . clopidogrel (PLAVIX) 75 MG tablet Take 1 tablet (75 mg total) by mouth daily. 90 tablet 1  . dexlansoprazole (DEXILANT) 60 MG capsule Take 60 mg by mouth daily.    Marland Kitchen estrogens, conjugated, (PREMARIN) 0.3 MG tablet Take 0.3 mg by mouth daily.    Marland Kitchen loratadine (CLARITIN) 10 MG tablet Take 1 tablet (10 mg total) by mouth daily. (Patient taking differently: Take 10 mg by mouth daily as needed. ) 30 tablet 11  . losartan (COZAAR) 50 MG tablet Take 1 tablet (50 mg total) by mouth daily. 90 tablet 1  . nebivolol (BYSTOLIC) 2.5 MG tablet Take 2.5 mg by mouth daily.    . niacin (NIASPAN) 500 MG CR tablet Take 1 tablet (500 mg total) by mouth at bedtime. 90 tablet 1  . rosuvastatin (CRESTOR) 20 MG tablet Take 1 tablet (20 mg total) by mouth daily. 90 tablet 1  . triamcinolone cream (KENALOG) 0.1 % Apply 1 application topically 2 (two) times daily. 30 g 1  . hydrochlorothiazide  (HYDRODIURIL) 25 MG tablet   1  . hydrOXYzine (ATARAX/VISTARIL) 10 MG tablet   1  . metroNIDAZOLE (FLAGYL) 500 MG tablet   0  . mupirocin ointment (BACTROBAN) 2 % Place 1 application into the nose 2 (two) times daily. 30 g 0  . ranitidine (ZANTAC) 150 MG tablet   0  . TOPICORT 0.25 % LIQD   2   No current facility-administered medications for this visit.   Facility-Administered Medications Ordered in Other Visits  Medication Dose Route Frequency Provider Last Rate Last Dose  . fludeoxyglucose F - 18 (FDG) injection 11.63 milli Curie  11.63 milli Curie Intravenous Once PRN Forest Gleason, MD      . heparin lock flush 100 unit/mL  500 Units Intravenous Once Forest Gleason, MD      . sodium chloride 0.9 % injection 10 mL  10 mL Intravenous PRN Forest Gleason, MD  OBJECTIVE: Filed Vitals:   12/04/15 1433  BP: 126/87  Pulse: 80  Temp: 97.4 F (36.3 C)  Resp: 18     Body mass index is 27.75 kg/(m^2).    ECOG FS:0 - Asymptomatic  Physical Exam  GENERAL:  Well developed, well nourished, sitting comfortably in the exam room in no acute distress. MENTAL STATUS:  Alert and oriented to person, place and time.   RESPIRATORY:  Clear to auscultation without rales, wheezes or rhonchi. CARDIOVASCULAR:  Regular rate and rhythm without murmur, rub or gallop. . ABDOMEN:  Soft, non-tender, with active bowel sounds, and no hepatosplenomegaly.  No masses. BACK:  No CVA tenderness.  No tenderness on percussion of the back or rib cage. SKIN:  No rashes, ulcers or lesions. Maculopapular rash in lower extremity most likely consistent with insect bite or cat scratch rash Centers applied steroid as well as Bactroban without any significant relief and will call contact primary physician EXTREMITIES: No edema, no skin discoloration or tenderness.  No palpable cords. LYMPH NODES: No palpable cervical, supraclavicular, axillary or inguinal adenopathy  NEUROLOGICAL: Unremarkable. PSYCH:  Appropriate. LAB  RESULTS:      Lab Results  Component Value Date   WBC 6.7 12/04/2015   NEUTROABS 3.8 12/04/2015   HGB 13.2 12/04/2015   HCT 38.4 12/04/2015   MCV 82.0 12/04/2015   PLT 228 12/04/2015       PET scan of December 03, 2015     IMPRESSION: 1. No evidence of residual or recurrent hypermetabolic lymphoma. 2. Age advanced coronary artery atherosclerosis. Recommend assessment of coronary risk factors and consideration of medical therapy.      ASSESSMENT: 1.diffuse large  cell lymphoma stage III status post 3 cycles of chemotherapy patient has excellent response on clinical ground.  Pet  scan of December 02, 2014 has been reviewed independently.. There is no evidence of recurrent or progressive disease.   Diffuse large B cell lymphoma   Staging form: Lymphoid Neoplasms, AJCC 6th Edition     Clinical: No stage assigned - Marni Griffon, MD   12/08/2015 4:04 PM

## 2015-12-11 ENCOUNTER — Inpatient Hospital Stay: Payer: 59

## 2015-12-11 DIAGNOSIS — I10 Essential (primary) hypertension: Secondary | ICD-10-CM | POA: Diagnosis not present

## 2015-12-11 DIAGNOSIS — Z452 Encounter for adjustment and management of vascular access device: Secondary | ICD-10-CM | POA: Diagnosis not present

## 2015-12-11 DIAGNOSIS — Z9221 Personal history of antineoplastic chemotherapy: Secondary | ICD-10-CM | POA: Diagnosis not present

## 2015-12-11 DIAGNOSIS — I2510 Atherosclerotic heart disease of native coronary artery without angina pectoris: Secondary | ICD-10-CM | POA: Diagnosis not present

## 2015-12-11 DIAGNOSIS — E785 Hyperlipidemia, unspecified: Secondary | ICD-10-CM | POA: Diagnosis not present

## 2015-12-11 DIAGNOSIS — Z85820 Personal history of malignant melanoma of skin: Secondary | ICD-10-CM | POA: Diagnosis not present

## 2015-12-11 DIAGNOSIS — Z95828 Presence of other vascular implants and grafts: Secondary | ICD-10-CM

## 2015-12-11 DIAGNOSIS — K219 Gastro-esophageal reflux disease without esophagitis: Secondary | ICD-10-CM | POA: Diagnosis not present

## 2015-12-11 DIAGNOSIS — Z85828 Personal history of other malignant neoplasm of skin: Secondary | ICD-10-CM | POA: Diagnosis not present

## 2015-12-11 DIAGNOSIS — C8338 Diffuse large B-cell lymphoma, lymph nodes of multiple sites: Secondary | ICD-10-CM | POA: Diagnosis not present

## 2015-12-11 MED ORDER — HEPARIN SOD (PORK) LOCK FLUSH 100 UNIT/ML IV SOLN
500.0000 [IU] | Freq: Once | INTRAVENOUS | Status: AC
  Administered 2015-12-11: 500 [IU] via INTRAVENOUS

## 2015-12-11 MED ORDER — HEPARIN SOD (PORK) LOCK FLUSH 100 UNIT/ML IV SOLN
INTRAVENOUS | Status: AC
  Filled 2015-12-11: qty 5

## 2015-12-11 MED ORDER — SODIUM CHLORIDE 0.9% FLUSH
10.0000 mL | INTRAVENOUS | Status: DC | PRN
  Administered 2015-12-11: 10 mL via INTRAVENOUS
  Filled 2015-12-11: qty 10

## 2015-12-24 ENCOUNTER — Other Ambulatory Visit: Payer: Self-pay

## 2015-12-24 MED ORDER — FINASTERIDE 5 MG PO TABS: 5.0000 mg | ORAL_TABLET | Freq: Every day | ORAL | Status: DC

## 2016-01-01 ENCOUNTER — Other Ambulatory Visit: Payer: Self-pay

## 2016-01-01 DIAGNOSIS — E785 Hyperlipidemia, unspecified: Secondary | ICD-10-CM

## 2016-01-01 DIAGNOSIS — I10 Essential (primary) hypertension: Secondary | ICD-10-CM

## 2016-01-01 DIAGNOSIS — I2510 Atherosclerotic heart disease of native coronary artery without angina pectoris: Secondary | ICD-10-CM

## 2016-01-01 MED ORDER — CLOPIDOGREL BISULFATE 75 MG PO TABS: 75.0000 mg | ORAL_TABLET | Freq: Every day | ORAL | Status: DC

## 2016-01-01 MED ORDER — ROSUVASTATIN CALCIUM 20 MG PO TABS: 20.0000 mg | ORAL_TABLET | Freq: Every day | ORAL | Status: DC

## 2016-01-01 MED ORDER — LOSARTAN POTASSIUM 50 MG PO TABS: 50.0000 mg | ORAL_TABLET | Freq: Every day | ORAL | Status: DC

## 2016-01-22 ENCOUNTER — Inpatient Hospital Stay: Payer: 59 | Attending: Oncology

## 2016-01-22 DIAGNOSIS — Z85820 Personal history of malignant melanoma of skin: Secondary | ICD-10-CM | POA: Insufficient documentation

## 2016-01-22 DIAGNOSIS — Z9221 Personal history of antineoplastic chemotherapy: Secondary | ICD-10-CM | POA: Insufficient documentation

## 2016-01-22 DIAGNOSIS — C8338 Diffuse large B-cell lymphoma, lymph nodes of multiple sites: Secondary | ICD-10-CM | POA: Insufficient documentation

## 2016-01-22 DIAGNOSIS — Z452 Encounter for adjustment and management of vascular access device: Secondary | ICD-10-CM | POA: Insufficient documentation

## 2016-01-22 DIAGNOSIS — Z85828 Personal history of other malignant neoplasm of skin: Secondary | ICD-10-CM | POA: Insufficient documentation

## 2016-01-23 ENCOUNTER — Inpatient Hospital Stay: Payer: 59

## 2016-01-23 DIAGNOSIS — Z452 Encounter for adjustment and management of vascular access device: Secondary | ICD-10-CM | POA: Diagnosis not present

## 2016-01-23 DIAGNOSIS — Z95828 Presence of other vascular implants and grafts: Secondary | ICD-10-CM

## 2016-01-23 DIAGNOSIS — C8338 Diffuse large B-cell lymphoma, lymph nodes of multiple sites: Secondary | ICD-10-CM | POA: Diagnosis not present

## 2016-01-23 DIAGNOSIS — Z85828 Personal history of other malignant neoplasm of skin: Secondary | ICD-10-CM | POA: Diagnosis not present

## 2016-01-23 DIAGNOSIS — Z9221 Personal history of antineoplastic chemotherapy: Secondary | ICD-10-CM | POA: Diagnosis not present

## 2016-01-23 DIAGNOSIS — Z85820 Personal history of malignant melanoma of skin: Secondary | ICD-10-CM | POA: Diagnosis not present

## 2016-01-23 MED ORDER — SODIUM CHLORIDE 0.9% FLUSH
10.0000 mL | INTRAVENOUS | Status: DC | PRN
  Administered 2016-01-23: 10 mL via INTRAVENOUS
  Filled 2016-01-23: qty 10

## 2016-01-23 MED ORDER — HEPARIN SOD (PORK) LOCK FLUSH 100 UNIT/ML IV SOLN
500.0000 [IU] | Freq: Once | INTRAVENOUS | Status: AC
  Administered 2016-01-23: 500 [IU] via INTRAVENOUS

## 2016-01-23 MED ORDER — HEPARIN SOD (PORK) LOCK FLUSH 100 UNIT/ML IV SOLN
INTRAVENOUS | Status: AC
  Filled 2016-01-23: qty 5

## 2016-02-12 ENCOUNTER — Other Ambulatory Visit: Payer: Self-pay

## 2016-02-12 DIAGNOSIS — E785 Hyperlipidemia, unspecified: Secondary | ICD-10-CM

## 2016-02-12 DIAGNOSIS — W57XXXA Bitten or stung by nonvenomous insect and other nonvenomous arthropods, initial encounter: Secondary | ICD-10-CM

## 2016-02-12 DIAGNOSIS — J0110 Acute frontal sinusitis, unspecified: Secondary | ICD-10-CM

## 2016-02-12 MED ORDER — MUPIROCIN 2 % EX OINT: 1.0000 "application " | TOPICAL_OINTMENT | Freq: Two times a day (BID) | CUTANEOUS | Status: DC

## 2016-02-12 MED ORDER — NIACIN ER (ANTIHYPERLIPIDEMIC) 500 MG PO TBCR: 500.0000 mg | EXTENDED_RELEASE_TABLET | Freq: Every day | ORAL | Status: DC

## 2016-02-12 MED ORDER — AZITHROMYCIN 250 MG PO TABS
ORAL_TABLET | ORAL | Status: DC
Start: 2016-02-12 — End: 2016-04-01

## 2016-03-04 ENCOUNTER — Inpatient Hospital Stay: Payer: 59 | Attending: Oncology

## 2016-03-06 ENCOUNTER — Inpatient Hospital Stay: Payer: 59 | Attending: Oncology

## 2016-03-06 DIAGNOSIS — R5381 Other malaise: Secondary | ICD-10-CM | POA: Insufficient documentation

## 2016-03-06 DIAGNOSIS — I2510 Atherosclerotic heart disease of native coronary artery without angina pectoris: Secondary | ICD-10-CM | POA: Insufficient documentation

## 2016-03-06 DIAGNOSIS — Z9221 Personal history of antineoplastic chemotherapy: Secondary | ICD-10-CM | POA: Insufficient documentation

## 2016-03-06 DIAGNOSIS — Z79899 Other long term (current) drug therapy: Secondary | ICD-10-CM | POA: Diagnosis not present

## 2016-03-06 DIAGNOSIS — R5383 Other fatigue: Secondary | ICD-10-CM | POA: Insufficient documentation

## 2016-03-06 DIAGNOSIS — E785 Hyperlipidemia, unspecified: Secondary | ICD-10-CM | POA: Insufficient documentation

## 2016-03-06 DIAGNOSIS — Z85828 Personal history of other malignant neoplasm of skin: Secondary | ICD-10-CM | POA: Insufficient documentation

## 2016-03-06 DIAGNOSIS — K219 Gastro-esophageal reflux disease without esophagitis: Secondary | ICD-10-CM | POA: Insufficient documentation

## 2016-03-06 DIAGNOSIS — C8338 Diffuse large B-cell lymphoma, lymph nodes of multiple sites: Secondary | ICD-10-CM | POA: Insufficient documentation

## 2016-03-06 DIAGNOSIS — Z85820 Personal history of malignant melanoma of skin: Secondary | ICD-10-CM | POA: Insufficient documentation

## 2016-03-06 DIAGNOSIS — I10 Essential (primary) hypertension: Secondary | ICD-10-CM | POA: Insufficient documentation

## 2016-03-06 DIAGNOSIS — Z452 Encounter for adjustment and management of vascular access device: Secondary | ICD-10-CM | POA: Insufficient documentation

## 2016-03-06 DIAGNOSIS — Z7982 Long term (current) use of aspirin: Secondary | ICD-10-CM | POA: Diagnosis not present

## 2016-03-06 DIAGNOSIS — Z95828 Presence of other vascular implants and grafts: Secondary | ICD-10-CM

## 2016-03-06 MED ORDER — SODIUM CHLORIDE 0.9% FLUSH
10.0000 mL | INTRAVENOUS | Status: DC | PRN
  Administered 2016-03-06: 10 mL via INTRAVENOUS
  Filled 2016-03-06: qty 10

## 2016-03-06 MED ORDER — HEPARIN SOD (PORK) LOCK FLUSH 100 UNIT/ML IV SOLN
500.0000 [IU] | Freq: Once | INTRAVENOUS | Status: AC
  Administered 2016-03-06: 500 [IU] via INTRAVENOUS

## 2016-03-31 DIAGNOSIS — R0681 Apnea, not elsewhere classified: Secondary | ICD-10-CM | POA: Insufficient documentation

## 2016-03-31 DIAGNOSIS — R0609 Other forms of dyspnea: Secondary | ICD-10-CM | POA: Insufficient documentation

## 2016-03-31 DIAGNOSIS — R0602 Shortness of breath: Secondary | ICD-10-CM | POA: Diagnosis not present

## 2016-03-31 DIAGNOSIS — E782 Mixed hyperlipidemia: Secondary | ICD-10-CM | POA: Insufficient documentation

## 2016-03-31 DIAGNOSIS — I25118 Atherosclerotic heart disease of native coronary artery with other forms of angina pectoris: Secondary | ICD-10-CM | POA: Diagnosis not present

## 2016-04-01 ENCOUNTER — Inpatient Hospital Stay (HOSPITAL_BASED_OUTPATIENT_CLINIC_OR_DEPARTMENT_OTHER): Payer: 59 | Admitting: Family Medicine

## 2016-04-01 ENCOUNTER — Inpatient Hospital Stay: Payer: 59

## 2016-04-01 VITALS — BP 150/93 | HR 89 | Temp 98.6°F | Wt 200.0 lb

## 2016-04-01 DIAGNOSIS — I10 Essential (primary) hypertension: Secondary | ICD-10-CM | POA: Diagnosis not present

## 2016-04-01 DIAGNOSIS — K219 Gastro-esophageal reflux disease without esophagitis: Secondary | ICD-10-CM

## 2016-04-01 DIAGNOSIS — C8338 Diffuse large B-cell lymphoma, lymph nodes of multiple sites: Secondary | ICD-10-CM

## 2016-04-01 DIAGNOSIS — Z9221 Personal history of antineoplastic chemotherapy: Secondary | ICD-10-CM | POA: Diagnosis not present

## 2016-04-01 DIAGNOSIS — R5383 Other fatigue: Secondary | ICD-10-CM

## 2016-04-01 DIAGNOSIS — E785 Hyperlipidemia, unspecified: Secondary | ICD-10-CM

## 2016-04-01 DIAGNOSIS — Z85828 Personal history of other malignant neoplasm of skin: Secondary | ICD-10-CM | POA: Diagnosis not present

## 2016-04-01 DIAGNOSIS — Z85820 Personal history of malignant melanoma of skin: Secondary | ICD-10-CM | POA: Diagnosis not present

## 2016-04-01 DIAGNOSIS — I2510 Atherosclerotic heart disease of native coronary artery without angina pectoris: Secondary | ICD-10-CM

## 2016-04-01 DIAGNOSIS — R531 Weakness: Secondary | ICD-10-CM

## 2016-04-01 DIAGNOSIS — C8330 Diffuse large B-cell lymphoma, unspecified site: Secondary | ICD-10-CM

## 2016-04-01 DIAGNOSIS — Z452 Encounter for adjustment and management of vascular access device: Secondary | ICD-10-CM

## 2016-04-01 DIAGNOSIS — Z7982 Long term (current) use of aspirin: Secondary | ICD-10-CM

## 2016-04-01 DIAGNOSIS — Z79899 Other long term (current) drug therapy: Secondary | ICD-10-CM

## 2016-04-01 DIAGNOSIS — C8331 Diffuse large B-cell lymphoma, lymph nodes of head, face, and neck: Secondary | ICD-10-CM

## 2016-04-01 DIAGNOSIS — R5381 Other malaise: Secondary | ICD-10-CM | POA: Diagnosis not present

## 2016-04-01 LAB — CBC WITH DIFFERENTIAL/PLATELET
Basophils Absolute: 0.1 10*3/uL (ref 0–0.1)
Basophils Relative: 1 %
EOS ABS: 0.1 10*3/uL (ref 0–0.7)
EOS PCT: 2 %
HCT: 36.5 % (ref 35.0–47.0)
Hemoglobin: 12.7 g/dL (ref 12.0–16.0)
LYMPHS ABS: 1.9 10*3/uL (ref 1.0–3.6)
Lymphocytes Relative: 26 %
MCH: 28.4 pg (ref 26.0–34.0)
MCHC: 34.9 g/dL (ref 32.0–36.0)
MCV: 81.4 fL (ref 80.0–100.0)
MONOS PCT: 11 %
Monocytes Absolute: 0.8 10*3/uL (ref 0.2–0.9)
Neutro Abs: 4.5 10*3/uL (ref 1.4–6.5)
Neutrophils Relative %: 60 %
PLATELETS: 191 10*3/uL (ref 150–440)
RBC: 4.49 MIL/uL (ref 3.80–5.20)
RDW: 14.6 % — ABNORMAL HIGH (ref 11.5–14.5)
WBC: 7.4 10*3/uL (ref 3.6–11.0)

## 2016-04-01 LAB — SEDIMENTATION RATE: SED RATE: 10 mm/h (ref 0–30)

## 2016-04-01 LAB — COMPREHENSIVE METABOLIC PANEL
ALK PHOS: 67 U/L (ref 38–126)
ALT: 20 U/L (ref 14–54)
ANION GAP: 5 (ref 5–15)
AST: 20 U/L (ref 15–41)
Albumin: 3.7 g/dL (ref 3.5–5.0)
BUN: 11 mg/dL (ref 6–20)
CALCIUM: 8.2 mg/dL — AB (ref 8.9–10.3)
CHLORIDE: 107 mmol/L (ref 101–111)
CO2: 24 mmol/L (ref 22–32)
Creatinine, Ser: 0.8 mg/dL (ref 0.44–1.00)
Glucose, Bld: 97 mg/dL (ref 65–99)
Potassium: 3.5 mmol/L (ref 3.5–5.1)
SODIUM: 136 mmol/L (ref 135–145)
Total Bilirubin: 0.5 mg/dL (ref 0.3–1.2)
Total Protein: 6.8 g/dL (ref 6.5–8.1)

## 2016-04-01 LAB — LACTATE DEHYDROGENASE: LDH: 106 U/L (ref 98–192)

## 2016-04-01 NOTE — Progress Notes (Signed)
[facility]  Telephone:(336) [phone]  Fax:(336) [phone]     Kendra Salas DOB: [birth year]  MR#: [id number]  SVX#:[id number]  Patient Care Team: Glean Hess, MD as PCP - General (Family Medicine)  CHIEF COMPLAINT:  Chief Complaint  Patient presents with  . Diffuse large B cell lymphoma    INTERVAL HISTORY:  Patient is here for further follow-up regarding the diffuse large B-cell lymphoma, originally diagnosed in 2015. Patient overall reports feeling fairly well. She has some complaints of malaise and fatigue and also reports having some cardiac issues that may be related to malaise and fatigue. 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 Patient denies any unusual lumps, bumps, nodules, or any other new areas of concern. Mrs. recently patient had a PET scan performed in February 2017 that showed no abnormality.  REVIEW OF SYSTEMS:   Review of Systems  Constitutional: Positive for malaise/fatigue. Negative for fever, chills, weight loss and diaphoresis.  HENT: Negative.   Eyes: Negative.   Respiratory: Negative for cough, hemoptysis, sputum production, shortness of breath and wheezing.   Cardiovascular: Negative for chest pain, palpitations, orthopnea, claudication, leg swelling and PND.  Gastrointestinal: Negative for heartburn, nausea, vomiting, abdominal pain, diarrhea, constipation, blood in stool and melena.  Genitourinary: Negative.   Musculoskeletal: Negative.   Skin: Negative.   Neurological: Negative for dizziness, tingling, focal weakness, seizures and weakness.  Endo/Heme/Allergies: Does not bruise/bleed easily.  Psychiatric/Behavioral: Negative for depression. The patient is not nervous/anxious and does not have insomnia.     As per HPI. Otherwise, a complete review of systems is negatve.  ONCOLOGY HISTORY: Oncology History   1.Marland Kitchenlymph node biopsy from the right axillary lymph node is positive for  diffuse B. large cell lymphoma BCL 6 rearrangement positive.MYC negative with activated B cells.  LDH is 333 done at lab-crp  Stage III.  bone marrow biopsy is negative for any involvement with lymphoma She started chemotherapy with R. CHOP in September of 2015.Marland Kitchen PATIENT HAS FINISHED TOTAL 6 CYCLES OF CHEMOTHERAPY WITH r chop (jANUARY, 2016) PET scan shows complete response  Patient had a right medial breast lesion 1.5 cm tumorjunctional and lentiginous dysplastic nevus with mild-to-moderate atypia 3.  Skin from left forearm hypertrophic actinic keratosis.  And superficial basal cell carcinoma.  From multiple area      Diffuse large B cell lymphoma ([place])   02/10/2015 Initial Diagnosis Diffuse large B cell lymphoma    PAST MEDICAL HISTORY: Past Medical History  Diagnosis Date  . Anxiety   . Clotting disorder ([place])   . Hyperlipemia   . CAD (coronary artery disease)   . Hypertension   . GERD (gastroesophageal reflux disease)   . Melanoma ([place])   . Lymphoma ([place])     PAST SURGICAL HISTORY: Past Surgical History  Procedure Laterality Date  . Gender re-affirmation    . Hernia repair      x2  . Cardiac catheterization      X2 - has 5 stents. 4 LAD, 1 posterior, Last 3 placed 2004  . Colonoscopy    . Facial cosmetic surgery    . Esophagogastroduodenoscopy (egd) with propofol N/A 11/22/2015    Procedure: ESOPHAGOGASTRODUODENOSCOPY (EGD) WITH PROPOFOL;  Surgeon: Lucilla Lame, MD;  Location: [facility];  Service: Endoscopy;  Laterality: N/A;    FAMILY HISTORY No family history on file.  GYNECOLOGIC HISTORY:  No LMP recorded. Patient is not currently having periods (Reason: Other).  ADVANCED DIRECTIVES:    HEALTH MAINTENANCE: Social History  Substance Use Topics  . Smoking status: Never Smoker   . Smokeless tobacco: Not on file  . Alcohol Use: 0.6 oz/week    1 Shots of liquor per week     No Known Allergies  Current Outpatient Prescriptions  Medication  Sig Dispense Refill  . aspirin 81 MG tablet Take 81 mg by mouth daily.    . clopidogrel (PLAVIX) 75 MG tablet     . dexlansoprazole (DEXILANT) 60 MG capsule Take 60 mg by mouth daily.    Marland Kitchen estrogens, conjugated, (PREMARIN) 0.3 MG tablet Take 0.3 mg by mouth daily.    . finasteride (PROSCAR) 5 MG tablet     . hydrochlorothiazide (HYDRODIURIL) 25 MG tablet     . hydrOXYzine (ATARAX/VISTARIL) 10 MG tablet   1  . loratadine (CLARITIN) 10 MG tablet Take 1 tablet (10 mg total) by mouth daily. (Patient taking differently: Take 10 mg by mouth daily as needed. ) 30 tablet 11  . losartan (COZAAR) 50 MG tablet     . mupirocin ointment (BACTROBAN) 2 % Place 1 application into the nose 2 (two) times daily. 30 g 0  . nebivolol (BYSTOLIC) 2.5 MG tablet Take 2.5 mg by mouth daily.    . niacin (NIASPAN) 500 MG CR tablet Take 1 tablet (500 mg total) by mouth at bedtime. 90 tablet 1  . ranitidine (ZANTAC) 150 MG tablet     . rosuvastatin (CRESTOR) 20 MG tablet     . TOPICORT 0.25 % LIQD   2  . triamcinolone cream (KENALOG) 0.1 % Apply 1 application topically 2 (two) times daily. 30 g 1   No current facility-administered medications for this visit.   Facility-Administered Medications Ordered in Other Visits  Medication Dose Route Frequency Provider Last Rate Last Dose  . heparin lock flush 100 unit/mL  500 Units Intravenous Once Forest Gleason, MD      . sodium chloride 0.9 % injection 10 mL  10 mL Intravenous PRN Forest Gleason, MD        OBJECTIVE: BP 150/93 mmHg  Pulse 89  Temp(Src) 98.6 F (37 C) (Oral)  Wt 200 lb (90.719 kg)   Body mass index is 27.91 kg/(m^2).    ECOG FS:1 - Symptomatic but completely ambulatory  General: Well-developed, well-nourished, no acute distress. Eyes: Pink conjunctiva, anicteric sclera. HEENT: Normocephalic, moist mucous membranes, clear oropharnyx. Lungs: Clear to auscultation bilaterally. Heart: Regular rate and rhythm. No rubs, murmurs, or gallops. Abdomen: Soft,  nontender, nondistended. No organomegaly noted, normoactive bowel sounds. Musculoskeletal: No edema, cyanosis, or clubbing. Neuro: Alert, answering all questions appropriately. Cranial nerves grossly intact. Skin: No rashes or petechiae noted. Psych: Normal affect. Lymphatics: No cervical, clavicular, axillary, or inguinal LAD.   LAB RESULTS:  Appointment on 04/01/2016  Component Date Value Ref Range Status  . WBC 04/01/2016 7.4  3.6 - 11.0 K/uL Final  . RBC 04/01/2016 4.49  3.80 - 5.20 MIL/uL Final  . Hemoglobin 04/01/2016 12.7  12.0 - 16.0 g/dL Final  . HCT 04/01/2016 36.5  35.0 - 47.0 % Final  . MCV 04/01/2016 81.4  80.0 - 100.0 fL Final  . MCH 04/01/2016 28.4  26.0 - 34.0 pg Final  . MCHC 04/01/2016 34.9  32.0 - 36.0 g/dL Final  . RDW 04/01/2016 14.6* 11.5 - 14.5 % Final  . Platelets 04/01/2016 191  150 - 440 K/uL Final  . Neutrophils Relative % 04/01/2016 60   Final  . Neutro  Abs 04/01/2016 4.5  1.4 - 6.5 K/uL Final  . Lymphocytes Relative 04/01/2016 26   Final  . Lymphs Abs 04/01/2016 1.9  1.0 - 3.6 K/uL Final  . Monocytes Relative 04/01/2016 11   Final  . Monocytes Absolute 04/01/2016 0.8  0.2 - 0.9 K/uL Final  . Eosinophils Relative 04/01/2016 2   Final  . Eosinophils Absolute 04/01/2016 0.1  0 - 0.7 K/uL Final  . Basophils Relative 04/01/2016 1   Final  . Basophils Absolute 04/01/2016 0.1  0 - 0.1 K/uL Final  . Sodium 04/01/2016 136  135 - 145 mmol/L Final  . Potassium 04/01/2016 3.5  3.5 - 5.1 mmol/L Final  . Chloride 04/01/2016 107  101 - 111 mmol/L Final  . CO2 04/01/2016 24  22 - 32 mmol/L Final  . Glucose, Bld 04/01/2016 97  65 - 99 mg/dL Final  . BUN 04/01/2016 11  6 - 20 mg/dL Final  . Creatinine, Ser 04/01/2016 0.80  0.44 - 1.00 mg/dL Final  . Calcium 04/01/2016 8.2* 8.9 - 10.3 mg/dL Final  . Total Protein 04/01/2016 6.8  6.5 - 8.1 g/dL Final  . Albumin 04/01/2016 3.7  3.5 - 5.0 g/dL Final  . AST 04/01/2016 20  15 - 41 U/L Final  . ALT 04/01/2016 20  14 - 54  U/L Final  . Alkaline Phosphatase 04/01/2016 67  38 - 126 U/L Final  . Total Bilirubin 04/01/2016 0.5  0.3 - 1.2 mg/dL Final  . GFR calc non Af Amer 04/01/2016 >60  >60 mL/min Final  . GFR calc Af Amer 04/01/2016 >60  >60 mL/min Final   Comment: (NOTE) The eGFR has been calculated using the CKD EPI equation. This calculation has not been validated in all clinical situations. eGFR's persistently <60 mL/min signify possible Chronic Kidney Disease.   . Anion gap 04/01/2016 5  5 - 15 Final  . LDH 04/01/2016 106  98 - 192 U/L Final    STUDIES: No results found.  ASSESSMENT:  Diffuse large B cell lymphoma, BCL 6 rearrangement positive, stage III.  PLAN:   1. Diffuse large B-cell lymphoma. Patient is status post completion of 6 cycles of R CHOP chemotherapy, completed in January 2016. Clinically there is no evidence of recurrent or progressive disease. PET scan was performed on 12/03/2015, no evidence of residual or recurrent hypermetabolic lymphoma. Patient was noted at that time on PET scan to have age advanced coronary artery atherosclerosis. Patient has continued to follow with cardiologist and possibility of stent replacement is being discussed.  Patient advised to continue with routine follow-up in approximately 6 months.  Patient expressed understanding and was in agreement with this plan. She also understands that She can call clinic at any time with any questions, concerns, or complaints.   Dr. Rogue Bussing was available for consultation and review of plan of care for this patient.  Diffuse large B cell lymphoma ([place])   Staging form: Lymphoid Neoplasms, AJCC 6th Edition     Clinical: Stage III   Evlyn Kanner, NP   04/01/2016 3:35 PM

## 2016-04-02 ENCOUNTER — Other Ambulatory Visit: Payer: Self-pay

## 2016-04-02 MED ORDER — METOPROLOL SUCCINATE ER 25 MG PO TB24: 25.0000 mg | ORAL_TABLET | Freq: Every day | ORAL | Status: DC

## 2016-04-06 DIAGNOSIS — T82857A Stenosis of cardiac prosthetic devices, implants and grafts, initial encounter: Secondary | ICD-10-CM | POA: Diagnosis not present

## 2016-04-06 DIAGNOSIS — E782 Mixed hyperlipidemia: Secondary | ICD-10-CM | POA: Diagnosis not present

## 2016-04-06 DIAGNOSIS — I25118 Atherosclerotic heart disease of native coronary artery with other forms of angina pectoris: Secondary | ICD-10-CM | POA: Diagnosis not present

## 2016-04-06 DIAGNOSIS — Z01818 Encounter for other preprocedural examination: Secondary | ICD-10-CM | POA: Diagnosis not present

## 2016-04-06 DIAGNOSIS — Z7902 Long term (current) use of antithrombotics/antiplatelets: Secondary | ICD-10-CM | POA: Diagnosis not present

## 2016-04-06 DIAGNOSIS — Z7982 Long term (current) use of aspirin: Secondary | ICD-10-CM | POA: Diagnosis not present

## 2016-04-06 DIAGNOSIS — Y831 Surgical operation with implant of artificial internal device as the cause of abnormal reaction of the patient, or of later complication, without mention of misadventure at the time of the procedure: Secondary | ICD-10-CM | POA: Diagnosis not present

## 2016-04-09 ENCOUNTER — Other Ambulatory Visit: Payer: Self-pay

## 2016-04-09 MED ORDER — SULFAMETHOXAZOLE-TRIMETHOPRIM 800-160 MG PO TABS: 1.0000 | ORAL_TABLET | Freq: Two times a day (BID) | ORAL | Status: DC

## 2016-04-14 ENCOUNTER — Other Ambulatory Visit: Payer: Self-pay

## 2016-04-15 ENCOUNTER — Inpatient Hospital Stay: Payer: 59

## 2016-04-16 ENCOUNTER — Other Ambulatory Visit: Payer: Self-pay

## 2016-04-16 DIAGNOSIS — Z79890 Hormone replacement therapy (postmenopausal): Secondary | ICD-10-CM

## 2016-04-16 DIAGNOSIS — I10 Essential (primary) hypertension: Secondary | ICD-10-CM

## 2016-04-16 DIAGNOSIS — E785 Hyperlipidemia, unspecified: Secondary | ICD-10-CM

## 2016-04-16 MED ORDER — FINASTERIDE 5 MG PO TABS: 5.0000 mg | ORAL_TABLET | Freq: Every day | ORAL | Status: DC

## 2016-04-16 MED ORDER — ESTRADIOL 2 MG PO TABS: 2.0000 mg | ORAL_TABLET | Freq: Every day | ORAL | Status: DC

## 2016-04-16 MED ORDER — CLOPIDOGREL BISULFATE 75 MG PO TABS: 75.0000 mg | ORAL_TABLET | Freq: Once | ORAL | Status: DC

## 2016-04-16 MED ORDER — LOSARTAN POTASSIUM 50 MG PO TABS: 50.0000 mg | ORAL_TABLET | Freq: Every day | ORAL | Status: DC

## 2016-04-16 MED ORDER — ROSUVASTATIN CALCIUM 20 MG PO TABS: 20.0000 mg | ORAL_TABLET | Freq: Every day | ORAL | Status: DC

## 2016-04-29 ENCOUNTER — Other Ambulatory Visit: Payer: Self-pay

## 2016-04-29 DIAGNOSIS — Z1231 Encounter for screening mammogram for malignant neoplasm of breast: Secondary | ICD-10-CM

## 2016-05-14 ENCOUNTER — Other Ambulatory Visit: Payer: Self-pay

## 2016-05-14 MED ORDER — SULFAMETHOXAZOLE-TRIMETHOPRIM 800-160 MG PO TABS: 1.0000 | ORAL_TABLET | Freq: Two times a day (BID) | ORAL | 0 refills | Status: DC

## 2016-05-22 ENCOUNTER — Inpatient Hospital Stay: Payer: 59 | Attending: Oncology

## 2016-05-22 DIAGNOSIS — Z9221 Personal history of antineoplastic chemotherapy: Secondary | ICD-10-CM | POA: Diagnosis not present

## 2016-05-22 DIAGNOSIS — C8338 Diffuse large B-cell lymphoma, lymph nodes of multiple sites: Secondary | ICD-10-CM | POA: Diagnosis not present

## 2016-05-22 DIAGNOSIS — Z85828 Personal history of other malignant neoplasm of skin: Secondary | ICD-10-CM | POA: Diagnosis not present

## 2016-05-22 DIAGNOSIS — Z85820 Personal history of malignant melanoma of skin: Secondary | ICD-10-CM | POA: Diagnosis not present

## 2016-05-22 DIAGNOSIS — Z452 Encounter for adjustment and management of vascular access device: Secondary | ICD-10-CM | POA: Diagnosis not present

## 2016-05-22 DIAGNOSIS — Z95828 Presence of other vascular implants and grafts: Secondary | ICD-10-CM

## 2016-05-22 MED ORDER — HEPARIN SOD (PORK) LOCK FLUSH 100 UNIT/ML IV SOLN
500.0000 [IU] | Freq: Once | INTRAVENOUS | Status: AC
  Administered 2016-05-22: 500 [IU] via INTRAVENOUS

## 2016-05-22 MED ORDER — SODIUM CHLORIDE 0.9% FLUSH
10.0000 mL | INTRAVENOUS | Status: DC | PRN
  Administered 2016-05-22: 10 mL via INTRAVENOUS
  Filled 2016-05-22: qty 10

## 2016-05-26 DIAGNOSIS — Z760 Encounter for issue of repeat prescription: Secondary | ICD-10-CM | POA: Diagnosis not present

## 2016-07-01 DIAGNOSIS — Z85820 Personal history of malignant melanoma of skin: Secondary | ICD-10-CM | POA: Diagnosis not present

## 2016-07-01 DIAGNOSIS — R203 Hyperesthesia: Secondary | ICD-10-CM | POA: Diagnosis not present

## 2016-07-01 DIAGNOSIS — L980 Pyogenic granuloma: Secondary | ICD-10-CM | POA: Diagnosis not present

## 2016-07-01 DIAGNOSIS — D485 Neoplasm of uncertain behavior of skin: Secondary | ICD-10-CM | POA: Diagnosis not present

## 2016-07-03 ENCOUNTER — Inpatient Hospital Stay: Payer: 59

## 2016-07-08 ENCOUNTER — Inpatient Hospital Stay: Payer: 59 | Attending: Oncology

## 2016-07-08 DIAGNOSIS — Z9221 Personal history of antineoplastic chemotherapy: Secondary | ICD-10-CM | POA: Diagnosis not present

## 2016-07-08 DIAGNOSIS — C8338 Diffuse large B-cell lymphoma, lymph nodes of multiple sites: Secondary | ICD-10-CM | POA: Insufficient documentation

## 2016-07-08 DIAGNOSIS — Z85820 Personal history of malignant melanoma of skin: Secondary | ICD-10-CM | POA: Insufficient documentation

## 2016-07-08 DIAGNOSIS — Z452 Encounter for adjustment and management of vascular access device: Secondary | ICD-10-CM | POA: Insufficient documentation

## 2016-07-08 DIAGNOSIS — Z95828 Presence of other vascular implants and grafts: Secondary | ICD-10-CM

## 2016-07-08 DIAGNOSIS — Z85828 Personal history of other malignant neoplasm of skin: Secondary | ICD-10-CM | POA: Insufficient documentation

## 2016-07-08 MED ORDER — HEPARIN SOD (PORK) LOCK FLUSH 100 UNIT/ML IV SOLN
INTRAVENOUS | Status: AC
  Filled 2016-07-08: qty 5

## 2016-07-08 MED ORDER — HEPARIN SOD (PORK) LOCK FLUSH 100 UNIT/ML IV SOLN
500.0000 [IU] | Freq: Once | INTRAVENOUS | Status: AC
  Administered 2016-07-08: 500 [IU] via INTRAVENOUS

## 2016-07-08 MED ORDER — SODIUM CHLORIDE 0.9% FLUSH
10.0000 mL | INTRAVENOUS | Status: DC | PRN
  Administered 2016-07-08: 10 mL via INTRAVENOUS
  Filled 2016-07-08: qty 10

## 2016-07-21 ENCOUNTER — Other Ambulatory Visit: Payer: Self-pay

## 2016-07-21 DIAGNOSIS — E782 Mixed hyperlipidemia: Secondary | ICD-10-CM

## 2016-07-21 DIAGNOSIS — I10 Essential (primary) hypertension: Secondary | ICD-10-CM

## 2016-07-21 DIAGNOSIS — Z79890 Hormone replacement therapy (postmenopausal): Secondary | ICD-10-CM

## 2016-07-21 MED ORDER — FINASTERIDE 5 MG PO TABS: 5.0000 mg | ORAL_TABLET | Freq: Every day | ORAL | 1 refills | Status: DC

## 2016-07-21 MED ORDER — LOSARTAN POTASSIUM 50 MG PO TABS: 50.0000 mg | ORAL_TABLET | Freq: Every day | ORAL | 1 refills | Status: DC

## 2016-07-21 MED ORDER — CLOPIDOGREL BISULFATE 75 MG PO TABS: 75.0000 mg | ORAL_TABLET | Freq: Once | ORAL | 1 refills | Status: AC

## 2016-07-21 MED ORDER — METOPROLOL SUCCINATE ER 25 MG PO TB24
25.0000 mg | ORAL_TABLET | Freq: Every day | ORAL | 1 refills | Status: DC
Start: 2016-07-21 — End: 2017-02-09

## 2016-07-21 MED ORDER — ROSUVASTATIN CALCIUM 20 MG PO TABS: 20.0000 mg | ORAL_TABLET | Freq: Every day | ORAL | 1 refills | Status: DC

## 2016-07-22 ENCOUNTER — Other Ambulatory Visit: Payer: Self-pay

## 2016-07-24 ENCOUNTER — Other Ambulatory Visit: Payer: Self-pay

## 2016-08-05 ENCOUNTER — Other Ambulatory Visit: Payer: Self-pay

## 2016-08-12 ENCOUNTER — Encounter (INDEPENDENT_AMBULATORY_CARE_PROVIDER_SITE_OTHER): Payer: Self-pay

## 2016-08-12 ENCOUNTER — Ambulatory Visit: Payer: 59

## 2016-08-12 ENCOUNTER — Other Ambulatory Visit: Payer: Self-pay | Admitting: Internal Medicine

## 2016-08-12 ENCOUNTER — Ambulatory Visit
Admission: RE | Admit: 2016-08-12 | Discharge: 2016-08-12 | Disposition: A | Discharge status: Home / Self Care | Payer: 59 | Source: Ambulatory Visit | Attending: Internal Medicine | Admitting: Internal Medicine

## 2016-08-12 DIAGNOSIS — Z1231 Encounter for screening mammogram for malignant neoplasm of breast: Secondary | ICD-10-CM

## 2016-08-12 HISTORY — DX: Personal history of antineoplastic chemotherapy: Z92.21

## 2016-08-13 ENCOUNTER — Inpatient Hospital Stay: Payer: 59 | Attending: Oncology

## 2016-08-13 DIAGNOSIS — C8338 Diffuse large B-cell lymphoma, lymph nodes of multiple sites: Secondary | ICD-10-CM | POA: Insufficient documentation

## 2016-08-13 DIAGNOSIS — Z85820 Personal history of malignant melanoma of skin: Secondary | ICD-10-CM | POA: Insufficient documentation

## 2016-08-13 DIAGNOSIS — Z95828 Presence of other vascular implants and grafts: Secondary | ICD-10-CM

## 2016-08-13 DIAGNOSIS — Z85828 Personal history of other malignant neoplasm of skin: Secondary | ICD-10-CM | POA: Insufficient documentation

## 2016-08-13 DIAGNOSIS — Z9221 Personal history of antineoplastic chemotherapy: Secondary | ICD-10-CM | POA: Insufficient documentation

## 2016-08-13 DIAGNOSIS — Z452 Encounter for adjustment and management of vascular access device: Secondary | ICD-10-CM | POA: Insufficient documentation

## 2016-08-13 MED ORDER — HEPARIN SOD (PORK) LOCK FLUSH 100 UNIT/ML IV SOLN
INTRAVENOUS | Status: AC
  Filled 2016-08-13: qty 5

## 2016-08-13 MED ORDER — SODIUM CHLORIDE 0.9% FLUSH
10.0000 mL | INTRAVENOUS | Status: DC | PRN
  Administered 2016-08-13: 10 mL via INTRAVENOUS
  Filled 2016-08-13: qty 10

## 2016-08-13 MED ORDER — HEPARIN SOD (PORK) LOCK FLUSH 100 UNIT/ML IV SOLN
500.0000 [IU] | Freq: Once | INTRAVENOUS | Status: AC
  Administered 2016-08-13: 500 [IU] via INTRAVENOUS

## 2016-08-14 ENCOUNTER — Inpatient Hospital Stay: Payer: 59

## 2016-09-18 ENCOUNTER — Inpatient Hospital Stay: Payer: 59

## 2016-09-18 ENCOUNTER — Inpatient Hospital Stay: Payer: 59 | Attending: Oncology

## 2016-09-18 ENCOUNTER — Other Ambulatory Visit: Payer: Self-pay

## 2016-09-18 DIAGNOSIS — Z85820 Personal history of malignant melanoma of skin: Secondary | ICD-10-CM | POA: Insufficient documentation

## 2016-09-18 DIAGNOSIS — Z7902 Long term (current) use of antithrombotics/antiplatelets: Secondary | ICD-10-CM | POA: Diagnosis not present

## 2016-09-18 DIAGNOSIS — I10 Essential (primary) hypertension: Secondary | ICD-10-CM | POA: Diagnosis not present

## 2016-09-18 DIAGNOSIS — Z955 Presence of coronary angioplasty implant and graft: Secondary | ICD-10-CM | POA: Insufficient documentation

## 2016-09-18 DIAGNOSIS — Z9221 Personal history of antineoplastic chemotherapy: Secondary | ICD-10-CM | POA: Insufficient documentation

## 2016-09-18 DIAGNOSIS — Z85828 Personal history of other malignant neoplasm of skin: Secondary | ICD-10-CM | POA: Diagnosis not present

## 2016-09-18 DIAGNOSIS — E785 Hyperlipidemia, unspecified: Secondary | ICD-10-CM | POA: Insufficient documentation

## 2016-09-18 DIAGNOSIS — I2510 Atherosclerotic heart disease of native coronary artery without angina pectoris: Secondary | ICD-10-CM | POA: Insufficient documentation

## 2016-09-18 DIAGNOSIS — Z79899 Other long term (current) drug therapy: Secondary | ICD-10-CM | POA: Insufficient documentation

## 2016-09-18 DIAGNOSIS — K219 Gastro-esophageal reflux disease without esophagitis: Secondary | ICD-10-CM | POA: Insufficient documentation

## 2016-09-18 DIAGNOSIS — C8338 Diffuse large B-cell lymphoma, lymph nodes of multiple sites: Secondary | ICD-10-CM | POA: Diagnosis not present

## 2016-09-18 DIAGNOSIS — Z95828 Presence of other vascular implants and grafts: Secondary | ICD-10-CM

## 2016-09-18 DIAGNOSIS — R21 Rash and other nonspecific skin eruption: Secondary | ICD-10-CM | POA: Insufficient documentation

## 2016-09-18 DIAGNOSIS — Z7982 Long term (current) use of aspirin: Secondary | ICD-10-CM | POA: Diagnosis not present

## 2016-09-18 DIAGNOSIS — C8331 Diffuse large B-cell lymphoma, lymph nodes of head, face, and neck: Secondary | ICD-10-CM

## 2016-09-18 LAB — CBC WITH DIFFERENTIAL/PLATELET
Basophils Absolute: 0 10*3/uL (ref 0–0.1)
Basophils Relative: 1 %
EOS ABS: 0.1 10*3/uL (ref 0–0.7)
EOS PCT: 1 %
HCT: 40.2 % (ref 35.0–47.0)
Hemoglobin: 13.4 g/dL (ref 12.0–16.0)
LYMPHS ABS: 1.6 10*3/uL (ref 1.0–3.6)
LYMPHS PCT: 22 %
MCH: 27.4 pg (ref 26.0–34.0)
MCHC: 33.3 g/dL (ref 32.0–36.0)
MCV: 82.3 fL (ref 80.0–100.0)
MONO ABS: 0.8 10*3/uL (ref 0.2–0.9)
MONOS PCT: 10 %
Neutro Abs: 5 10*3/uL (ref 1.4–6.5)
Neutrophils Relative %: 66 %
PLATELETS: 194 10*3/uL (ref 150–440)
RBC: 4.88 MIL/uL (ref 3.80–5.20)
RDW: 14.4 % (ref 11.5–14.5)
WBC: 7.6 10*3/uL (ref 3.6–11.0)

## 2016-09-18 LAB — COMPREHENSIVE METABOLIC PANEL
ALT: 20 U/L (ref 14–54)
ANION GAP: 8 (ref 5–15)
AST: 25 U/L (ref 15–41)
Albumin: 4 g/dL (ref 3.5–5.0)
Alkaline Phosphatase: 55 U/L (ref 38–126)
BUN: 15 mg/dL (ref 6–20)
CHLORIDE: 105 mmol/L (ref 101–111)
CO2: 22 mmol/L (ref 22–32)
CREATININE: 0.82 mg/dL (ref 0.44–1.00)
Calcium: 8.7 mg/dL — ABNORMAL LOW (ref 8.9–10.3)
Glucose, Bld: 106 mg/dL — ABNORMAL HIGH (ref 65–99)
POTASSIUM: 3.6 mmol/L (ref 3.5–5.1)
SODIUM: 135 mmol/L (ref 135–145)
Total Bilirubin: 0.6 mg/dL (ref 0.3–1.2)
Total Protein: 7.1 g/dL (ref 6.5–8.1)

## 2016-09-18 LAB — SEDIMENTATION RATE: SED RATE: 8 mm/h (ref 0–30)

## 2016-09-18 LAB — LACTATE DEHYDROGENASE: LDH: 116 U/L (ref 98–192)

## 2016-09-18 MED ORDER — HEPARIN SOD (PORK) LOCK FLUSH 100 UNIT/ML IV SOLN
500.0000 [IU] | Freq: Once | INTRAVENOUS | Status: AC
  Administered 2016-09-18: 500 [IU] via INTRAVENOUS

## 2016-09-18 MED ORDER — SODIUM CHLORIDE 0.9% FLUSH
10.0000 mL | INTRAVENOUS | Status: DC | PRN
  Administered 2016-09-18: 10 mL via INTRAVENOUS
  Filled 2016-09-18: qty 10

## 2016-09-21 ENCOUNTER — Other Ambulatory Visit: Payer: 59

## 2016-09-22 ENCOUNTER — Inpatient Hospital Stay (HOSPITAL_BASED_OUTPATIENT_CLINIC_OR_DEPARTMENT_OTHER): Payer: 59 | Admitting: Internal Medicine

## 2016-09-22 ENCOUNTER — Other Ambulatory Visit: Payer: 59

## 2016-09-22 VITALS — BP 136/96 | HR 75 | Temp 98.4°F | Resp 18 | Wt 202.7 lb

## 2016-09-22 DIAGNOSIS — I2510 Atherosclerotic heart disease of native coronary artery without angina pectoris: Secondary | ICD-10-CM | POA: Diagnosis not present

## 2016-09-22 DIAGNOSIS — K219 Gastro-esophageal reflux disease without esophagitis: Secondary | ICD-10-CM

## 2016-09-22 DIAGNOSIS — I10 Essential (primary) hypertension: Secondary | ICD-10-CM

## 2016-09-22 DIAGNOSIS — E785 Hyperlipidemia, unspecified: Secondary | ICD-10-CM

## 2016-09-22 DIAGNOSIS — C8338 Diffuse large B-cell lymphoma, lymph nodes of multiple sites: Secondary | ICD-10-CM | POA: Diagnosis not present

## 2016-09-22 DIAGNOSIS — Z7902 Long term (current) use of antithrombotics/antiplatelets: Secondary | ICD-10-CM

## 2016-09-22 DIAGNOSIS — Z79899 Other long term (current) drug therapy: Secondary | ICD-10-CM

## 2016-09-22 DIAGNOSIS — Z85828 Personal history of other malignant neoplasm of skin: Secondary | ICD-10-CM | POA: Diagnosis not present

## 2016-09-22 DIAGNOSIS — R21 Rash and other nonspecific skin eruption: Secondary | ICD-10-CM

## 2016-09-22 DIAGNOSIS — Z9221 Personal history of antineoplastic chemotherapy: Secondary | ICD-10-CM

## 2016-09-22 DIAGNOSIS — Z85820 Personal history of malignant melanoma of skin: Secondary | ICD-10-CM

## 2016-09-22 DIAGNOSIS — Z7982 Long term (current) use of aspirin: Secondary | ICD-10-CM

## 2016-09-22 DIAGNOSIS — C8334 Diffuse large B-cell lymphoma, lymph nodes of axilla and upper limb: Secondary | ICD-10-CM

## 2016-09-22 DIAGNOSIS — Z955 Presence of coronary angioplasty implant and graft: Secondary | ICD-10-CM

## 2016-09-22 NOTE — Progress Notes (Signed)
Patient is here for follow up, she is doing well. She does mention she has a rash on her leg that has been there for a year and a half.

## 2016-09-22 NOTE — Assessment & Plan Note (Addendum)
#   DLBCL-A, B, and C subtype status post 6 cycles of R CHOP; complete remission [finished chemotherapy summer of 2016]. Last PET scan in February 2017 NED. Clinically no evidence of recurrence. Labs reviewed within normal limits.  # CAD status post stenting. Stable.  # Port Flush every 6 weeks; keeping it for now. Plan to take it out  Take it out in the next 6-12 months.   #Bilateral chronic skin rash-inflammatory. Topical steroids/ deferred to dermatology.  # follow up in 6 months/ labs.

## 2016-09-22 NOTE — Progress Notes (Signed)
[place] OFFICE PROGRESS NOTE  Patient Care Team: Glean Hess, MD as PCP - General (Family Medicine)  Diffuse large B-cell lymphoma of lymph nodes of axilla [facility])   Staging form: Lymphoid Neoplasms, AJCC 6th Edition   - Clinical: No stage assigned - Unsigned   Oncology History   # SEP 2015- .lymph node biopsy from the right axillary lymph node is positive for diffuse B. large cell lymphoma [activated B cell lymphoma];  BCL 6 rearrangement positive.MYC negative with activated B cells.  LDH is 333 done at lab-crp ; Stage IV [spleen POSITIVE]; bone marrow biopsy is negative for any involvement with lymphoma; She started chemotherapy with R. CHOP in September of 2015.Marland Kitchen PATIENT HAS FINISHED TOTAL 6 CYCLES OF CHEMOTHERAPY WITH r chop (jANUARY, 2016); PET scan shows complete response; Last PET FEB 2017  # Patient had a right medial breast lesion 1.5 cm tumorjunctional and lentiginous dysplastic nevus with mild-to-moderate atypia  #  Skin from left forearm hypertrophic actinic keratosis.  And superficial basal cell carcinoma.  From multiple area      Diffuse large B-cell lymphoma of lymph nodes of axilla ([place])   02/10/2015 Initial Diagnosis    Diffuse large B cell lymphoma        This is my first interaction with the patient as patient's primary oncologist has been Dr.Choksi. I reviewed the patient's prior charts/pertinent labs/imaging in detail; findings are summarized above.     INTERVAL HISTORY:  Kendra Salas 65 y.o.  female pleasant patient above history of Diffuse large B-cell lymphoma ABC subtype [stage IV; involvement of spleen] status post R CHOP finished in summer 2016 is here for follow-up.  Patient denies any new lumps or bumps. Appetite is good. No weight loss. No night sweats.  Patient is chronic skin rash bilateral extremities- status post biopsy suggestive of inflammatory process. Not infectious. Patient also recently had cardiac stents put in on  aspirin and Plavix.  REVIEW OF SYSTEMS:  A complete 10 point review of system is done which is negative except mentioned above/history of present illness.   PAST MEDICAL HISTORY :  Past Medical History:  Diagnosis Date  . Anxiety   . CAD (coronary artery disease)   . Clotting disorder ([place])   . GERD (gastroesophageal reflux disease)   . Hyperlipemia   . Hypertension   . Lymphoma ([place])   . Melanoma ([place])   . Personal history of chemotherapy     PAST SURGICAL HISTORY :   Past Surgical History:  Procedure Laterality Date  . AUGMENTATION MAMMAPLASTY Bilateral 2003  . CARDIAC CATHETERIZATION     X2 - has 5 stents. 4 LAD, 1 posterior, Last 3 placed 2004  . COLONOSCOPY    . ESOPHAGOGASTRODUODENOSCOPY (EGD) WITH PROPOFOL N/A 11/22/2015   Procedure: ESOPHAGOGASTRODUODENOSCOPY (EGD) WITH PROPOFOL;  Surgeon: Lucilla Lame, MD;  Location: [place];  Service: Endoscopy;  Laterality: N/A;  . FACIAL COSMETIC SURGERY    . gender re-affirmation    . HERNIA REPAIR     x2    FAMILY HISTORY :   Family History  Problem Relation Age of Onset  . Breast cancer Neg Hx     SOCIAL HISTORY:   Social History  Substance Use Topics  . Smoking status: Never Smoker  . Smokeless tobacco: Not on file  . Alcohol use 0.6 oz/week    1 Shots of liquor per week    ALLERGIES:  has No Known Allergies.  MEDICATIONS:  Current Outpatient Prescriptions  Medication Sig Dispense Refill  . aspirin 81 MG tablet Take 81 mg by mouth daily.    Marland Kitchen dexlansoprazole (DEXILANT) 60 MG capsule Take 60 mg by mouth daily.    Marland Kitchen estradiol (ESTRACE) 2 MG tablet Take 1 tablet (2 mg total) by mouth daily. 90 tablet 1  . finasteride (PROSCAR) 5 MG tablet Take 1 tablet (5 mg total) by mouth daily. 90 tablet 1  . losartan (COZAAR) 50 MG tablet Take 1 tablet (50 mg total) by mouth daily. 90 tablet 1  . metoprolol succinate (TOPROL-XL) 25 MG 24 hr tablet Take 1 tablet (25 mg total) by mouth daily. 90 tablet 1  . mupirocin  ointment (BACTROBAN) 2 % Place 1 application into the nose 2 (two) times daily. 30 g 0  . rosuvastatin (CRESTOR) 20 MG tablet Take 1 tablet (20 mg total) by mouth daily. 90 tablet 1  . triamcinolone cream (KENALOG) 0.1 % Apply 1 application topically 2 (two) times daily. 30 g 1  . hydrochlorothiazide (HYDRODIURIL) 25 MG tablet     . hydrOXYzine (ATARAX/VISTARIL) 10 MG tablet   1  . loratadine (CLARITIN) 10 MG tablet Take 1 tablet (10 mg total) by mouth daily. (Patient not taking: Reported on 09/22/2016) 30 tablet 11  . nebivolol (BYSTOLIC) 2.5 MG tablet Take 2.5 mg by mouth daily.    . niacin (NIASPAN) 500 MG CR tablet Take 1 tablet (500 mg total) by mouth at bedtime. (Patient not taking: Reported on 09/22/2016) 90 tablet 1  . ranitidine (ZANTAC) 150 MG tablet     . sulfamethoxazole-trimethoprim (BACTRIM DS,SEPTRA DS) 800-160 MG tablet Take 1 tablet by mouth 2 (two) times daily. (Patient not taking: Reported on 09/22/2016) 14 tablet 0  . sulfamethoxazole-trimethoprim (BACTRIM DS,SEPTRA DS) 800-160 MG tablet Take 1 tablet by mouth 2 (two) times daily. (Patient not taking: Reported on 09/22/2016) 14 tablet 0   No current facility-administered medications for this visit.    Facility-Administered Medications Ordered in Other Visits  Medication Dose Route Frequency Provider Last Rate Last Dose  . heparin lock flush 100 unit/mL  500 Units Intravenous Once Forest Gleason, MD      . sodium chloride 0.9 % injection 10 mL  10 mL Intravenous PRN Forest Gleason, MD        PHYSICAL EXAMINATION: ECOG PERFORMANCE STATUS: 0 - Asymptomatic  BP (!) 136/96 (BP Location: Right Arm, Patient Position: Sitting)   Pulse 75   Temp 98.4 F (36.9 C) (Tympanic)   Resp 18   Wt 202 lb 11.4 oz (92 kg)   SpO2 (!) 76%   BMI 28.27 kg/m   Filed Weights   09/22/16 1122  Weight: 202 lb 11.4 oz (92 kg)    GENERAL: Well-nourished well-developed; Alert, no distress and comfortable. Alone. EYES: no pallor or  icterus OROPHARYNX: no thrush or ulceration; good dentition  NECK: supple, no masses felt LYMPH:  no palpable lymphadenopathy in the cervical, axillary or inguinal regions LUNGS: clear to auscultation and  No wheeze or crackles HEART/CVS: regular rate & rhythm and no murmurs; No lower extremity edema ABDOMEN:abdomen soft, non-tender and normal bowel sounds Musculoskeletal:no cyanosis of digits and no clubbing  PSYCH: alert & oriented x 3 with fluent speech NEURO: no focal motor/sensory deficits SKIN:  Macular papular rash noted [chronic]  LABORATORY DATA:  I have reviewed the data as listed    Component Value Date/Time   NA 135 09/18/2016 0919   NA 139 10/16/2014 0845   K 3.6 09/18/2016 0919  K 3.7 10/16/2014 0845   CL 105 09/18/2016 0919   CL 107 10/16/2014 0845   CO2 22 09/18/2016 0919   CO2 25 10/16/2014 0845   GLUCOSE 106 (H) 09/18/2016 0919   GLUCOSE 80 10/16/2014 0845   BUN 15 09/18/2016 0919   BUN 11 10/16/2014 0845   CREATININE 0.82 09/18/2016 0919   CREATININE 0.85 10/16/2014 0845   CALCIUM 8.7 (L) 09/18/2016 0919   CALCIUM 8.1 (L) 10/16/2014 0845   PROT 7.1 09/18/2016 0919   PROT 6.4 10/16/2014 0845   ALBUMIN 4.0 09/18/2016 0919   ALBUMIN 3.1 (L) 10/16/2014 0845   AST 25 09/18/2016 0919   AST 23 10/16/2014 0845   ALT 20 09/18/2016 0919   ALT 18 10/16/2014 0845   ALKPHOS 55 09/18/2016 0919   ALKPHOS 47 10/16/2014 0845   BILITOT 0.6 09/18/2016 0919   BILITOT 0.3 10/16/2014 0845   GFRNONAA >60 09/18/2016 0919   GFRNONAA >60 10/16/2014 0845   GFRAA >60 09/18/2016 0919   GFRAA >60 10/16/2014 0845    No results found for: SPEP, UPEP  Lab Results  Component Value Date   WBC 7.6 09/18/2016   NEUTROABS 5.0 09/18/2016   HGB 13.4 09/18/2016   HCT 40.2 09/18/2016   MCV 82.3 09/18/2016   PLT 194 09/18/2016      Chemistry      Component Value Date/Time   NA 135 09/18/2016 0919   NA 139 10/16/2014 0845   K 3.6 09/18/2016 0919   K 3.7 10/16/2014 0845    CL 105 09/18/2016 0919   CL 107 10/16/2014 0845   CO2 22 09/18/2016 0919   CO2 25 10/16/2014 0845   BUN 15 09/18/2016 0919   BUN 11 10/16/2014 0845   CREATININE 0.82 09/18/2016 0919   CREATININE 0.85 10/16/2014 0845      Component Value Date/Time   CALCIUM 8.7 (L) 09/18/2016 0919   CALCIUM 8.1 (L) 10/16/2014 0845   ALKPHOS 55 09/18/2016 0919   ALKPHOS 47 10/16/2014 0845   AST 25 09/18/2016 0919   AST 23 10/16/2014 0845   ALT 20 09/18/2016 0919   ALT 18 10/16/2014 0845   BILITOT 0.6 09/18/2016 0919   BILITOT 0.3 10/16/2014 0845       RADIOGRAPHIC STUDIES: I have personally reviewed the radiological images as listed and agreed with the findings in the report. No results found.   ASSESSMENT & PLAN:  Diffuse large B-cell lymphoma of lymph nodes of axilla ([facility]) # DLBCL-A, B, and C subtype status post 6 cycles of R CHOP; complete remission [finished chemotherapy summer of 2016]. Last PET scan in February 2017 NED. Clinically no evidence of recurrence. Labs reviewed within normal limits.  # CAD status post stenting.  # skin rash-inflammatory.  # follow up in 6 months/ labs.   Orders Placed This Encounter  Procedures  . CBC with Differential/Platelet    Standing Status:   Future    Standing Expiration Date:   09/22/2017  . Comprehensive metabolic panel    Standing Status:   Future    Standing Expiration Date:   09/22/2017  . Lactate dehydrogenase    Standing Status:   Future    Standing Expiration Date:   09/22/2017   All questions were answered. The patient knows to call the clinic with any problems, questions or concerns.      Cammie Sickle, MD 09/22/2016 11:53 AM

## 2016-09-23 ENCOUNTER — Other Ambulatory Visit: Payer: 59

## 2016-09-23 ENCOUNTER — Ambulatory Visit: Payer: 59

## 2016-09-25 ENCOUNTER — Inpatient Hospital Stay: Payer: 59

## 2016-09-30 ENCOUNTER — Other Ambulatory Visit: Payer: Self-pay

## 2016-09-30 DIAGNOSIS — J0110 Acute frontal sinusitis, unspecified: Secondary | ICD-10-CM

## 2016-09-30 DIAGNOSIS — T63441A Toxic effect of venom of bees, accidental (unintentional), initial encounter: Secondary | ICD-10-CM

## 2016-09-30 DIAGNOSIS — Z79890 Hormone replacement therapy (postmenopausal): Secondary | ICD-10-CM

## 2016-09-30 MED ORDER — ESTRADIOL 2 MG PO TABS: 2.0000 mg | ORAL_TABLET | Freq: Every day | ORAL | 3 refills | Status: DC

## 2016-09-30 MED ORDER — AZITHROMYCIN 250 MG PO TABS: ORAL_TABLET | ORAL | 0 refills | Status: DC

## 2016-09-30 MED ORDER — EPINEPHRINE 0.3 MG/0.3ML IJ SOAJ: 0.3000 mg | Freq: Once | INTRAMUSCULAR | 1 refills | Status: AC

## 2016-10-13 ENCOUNTER — Other Ambulatory Visit: Payer: Self-pay

## 2016-10-13 DIAGNOSIS — W57XXXS Bitten or stung by nonvenomous insect and other nonvenomous arthropods, sequela: Secondary | ICD-10-CM

## 2016-10-13 MED ORDER — MUPIROCIN 2 % EX OINT: 1.0000 "application " | TOPICAL_OINTMENT | Freq: Two times a day (BID) | CUTANEOUS | 0 refills | Status: DC

## 2016-10-13 MED ORDER — DOXYCYCLINE HYCLATE 100 MG PO TABS: 100.0000 mg | ORAL_TABLET | Freq: Two times a day (BID) | ORAL | 0 refills | Status: DC

## 2016-11-06 ENCOUNTER — Inpatient Hospital Stay: Payer: 59 | Attending: Oncology

## 2016-11-06 DIAGNOSIS — Z9221 Personal history of antineoplastic chemotherapy: Secondary | ICD-10-CM | POA: Diagnosis not present

## 2016-11-06 DIAGNOSIS — Z85828 Personal history of other malignant neoplasm of skin: Secondary | ICD-10-CM | POA: Insufficient documentation

## 2016-11-06 DIAGNOSIS — Z452 Encounter for adjustment and management of vascular access device: Secondary | ICD-10-CM | POA: Diagnosis not present

## 2016-11-06 DIAGNOSIS — C8338 Diffuse large B-cell lymphoma, lymph nodes of multiple sites: Secondary | ICD-10-CM | POA: Diagnosis not present

## 2016-11-06 DIAGNOSIS — Z85820 Personal history of malignant melanoma of skin: Secondary | ICD-10-CM | POA: Insufficient documentation

## 2016-11-06 DIAGNOSIS — Z95828 Presence of other vascular implants and grafts: Secondary | ICD-10-CM

## 2016-11-06 MED ORDER — HEPARIN SOD (PORK) LOCK FLUSH 100 UNIT/ML IV SOLN
500.0000 [IU] | Freq: Once | INTRAVENOUS | Status: AC
  Administered 2016-11-06: 500 [IU] via INTRAVENOUS

## 2016-11-06 MED ORDER — SODIUM CHLORIDE 0.9% FLUSH
10.0000 mL | INTRAVENOUS | Status: DC | PRN
  Administered 2016-11-06: 10 mL via INTRAVENOUS
  Filled 2016-11-06: qty 10

## 2016-11-09 ENCOUNTER — Other Ambulatory Visit: Payer: Self-pay

## 2016-11-09 DIAGNOSIS — I25118 Atherosclerotic heart disease of native coronary artery with other forms of angina pectoris: Secondary | ICD-10-CM

## 2016-11-09 DIAGNOSIS — Z79890 Hormone replacement therapy (postmenopausal): Secondary | ICD-10-CM

## 2016-11-09 DIAGNOSIS — E782 Mixed hyperlipidemia: Secondary | ICD-10-CM

## 2016-11-09 DIAGNOSIS — I10 Essential (primary) hypertension: Secondary | ICD-10-CM

## 2016-11-25 DIAGNOSIS — L280 Lichen simplex chronicus: Secondary | ICD-10-CM | POA: Diagnosis not present

## 2016-11-25 DIAGNOSIS — Z1283 Encounter for screening for malignant neoplasm of skin: Secondary | ICD-10-CM | POA: Diagnosis not present

## 2016-11-25 DIAGNOSIS — D485 Neoplasm of uncertain behavior of skin: Secondary | ICD-10-CM | POA: Diagnosis not present

## 2016-11-25 DIAGNOSIS — L578 Other skin changes due to chronic exposure to nonionizing radiation: Secondary | ICD-10-CM | POA: Diagnosis not present

## 2016-11-25 DIAGNOSIS — Z85820 Personal history of malignant melanoma of skin: Secondary | ICD-10-CM | POA: Diagnosis not present

## 2016-11-25 DIAGNOSIS — D1800 Hemangioma unspecified site: Secondary | ICD-10-CM | POA: Diagnosis not present

## 2016-11-25 DIAGNOSIS — Z85828 Personal history of other malignant neoplasm of skin: Secondary | ICD-10-CM | POA: Diagnosis not present

## 2016-11-25 DIAGNOSIS — L821 Other seborrheic keratosis: Secondary | ICD-10-CM | POA: Diagnosis not present

## 2016-12-18 ENCOUNTER — Inpatient Hospital Stay: Payer: 59 | Attending: Oncology

## 2016-12-18 DIAGNOSIS — Z85820 Personal history of malignant melanoma of skin: Secondary | ICD-10-CM | POA: Insufficient documentation

## 2016-12-18 DIAGNOSIS — Z9221 Personal history of antineoplastic chemotherapy: Secondary | ICD-10-CM | POA: Insufficient documentation

## 2016-12-18 DIAGNOSIS — C8338 Diffuse large B-cell lymphoma, lymph nodes of multiple sites: Secondary | ICD-10-CM | POA: Insufficient documentation

## 2016-12-18 DIAGNOSIS — Z85828 Personal history of other malignant neoplasm of skin: Secondary | ICD-10-CM | POA: Insufficient documentation

## 2016-12-18 DIAGNOSIS — Z452 Encounter for adjustment and management of vascular access device: Secondary | ICD-10-CM | POA: Insufficient documentation

## 2016-12-23 ENCOUNTER — Inpatient Hospital Stay: Payer: 59

## 2016-12-23 VITALS — BP 134/81 | HR 67 | Resp 18

## 2016-12-23 DIAGNOSIS — Z85828 Personal history of other malignant neoplasm of skin: Secondary | ICD-10-CM | POA: Diagnosis not present

## 2016-12-23 DIAGNOSIS — Z85820 Personal history of malignant melanoma of skin: Secondary | ICD-10-CM | POA: Diagnosis not present

## 2016-12-23 DIAGNOSIS — Z9221 Personal history of antineoplastic chemotherapy: Secondary | ICD-10-CM | POA: Diagnosis not present

## 2016-12-23 DIAGNOSIS — L810 Postinflammatory hyperpigmentation: Secondary | ICD-10-CM | POA: Diagnosis not present

## 2016-12-23 DIAGNOSIS — C8338 Diffuse large B-cell lymphoma, lymph nodes of multiple sites: Secondary | ICD-10-CM | POA: Diagnosis not present

## 2016-12-23 DIAGNOSIS — L280 Lichen simplex chronicus: Secondary | ICD-10-CM | POA: Diagnosis not present

## 2016-12-23 DIAGNOSIS — Z452 Encounter for adjustment and management of vascular access device: Secondary | ICD-10-CM | POA: Diagnosis not present

## 2016-12-23 DIAGNOSIS — Z95828 Presence of other vascular implants and grafts: Secondary | ICD-10-CM

## 2016-12-23 MED ORDER — HEPARIN SOD (PORK) LOCK FLUSH 100 UNIT/ML IV SOLN
500.0000 [IU] | Freq: Once | INTRAVENOUS | Status: AC
  Administered 2016-12-23: 500 [IU] via INTRAVENOUS
  Filled 2016-12-23: qty 5

## 2016-12-23 MED ORDER — SODIUM CHLORIDE 0.9% FLUSH
10.0000 mL | INTRAVENOUS | Status: DC | PRN
  Administered 2016-12-23: 10 mL via INTRAVENOUS
  Filled 2016-12-23: qty 10

## 2017-01-26 ENCOUNTER — Inpatient Hospital Stay: Payer: 59 | Attending: Internal Medicine

## 2017-01-26 DIAGNOSIS — C8338 Diffuse large B-cell lymphoma, lymph nodes of multiple sites: Secondary | ICD-10-CM | POA: Diagnosis not present

## 2017-01-26 DIAGNOSIS — Z85828 Personal history of other malignant neoplasm of skin: Secondary | ICD-10-CM | POA: Insufficient documentation

## 2017-01-26 DIAGNOSIS — Z85820 Personal history of malignant melanoma of skin: Secondary | ICD-10-CM | POA: Insufficient documentation

## 2017-01-26 DIAGNOSIS — Z9221 Personal history of antineoplastic chemotherapy: Secondary | ICD-10-CM | POA: Diagnosis not present

## 2017-01-26 DIAGNOSIS — Z452 Encounter for adjustment and management of vascular access device: Secondary | ICD-10-CM | POA: Diagnosis not present

## 2017-01-26 DIAGNOSIS — Z95828 Presence of other vascular implants and grafts: Secondary | ICD-10-CM

## 2017-01-26 MED ORDER — HEPARIN SOD (PORK) LOCK FLUSH 100 UNIT/ML IV SOLN
500.0000 [IU] | Freq: Once | INTRAVENOUS | Status: AC
  Administered 2017-01-26: 500 [IU] via INTRAVENOUS

## 2017-01-26 MED ORDER — SODIUM CHLORIDE 0.9% FLUSH
10.0000 mL | INTRAVENOUS | Status: DC | PRN
  Administered 2017-01-26: 10 mL via INTRAVENOUS
  Filled 2017-01-26: qty 10

## 2017-02-09 ENCOUNTER — Other Ambulatory Visit: Payer: Self-pay

## 2017-02-09 DIAGNOSIS — Z79890 Hormone replacement therapy (postmenopausal): Secondary | ICD-10-CM

## 2017-02-09 DIAGNOSIS — I10 Essential (primary) hypertension: Secondary | ICD-10-CM

## 2017-02-09 MED ORDER — CLOPIDOGREL BISULFATE 75 MG PO TABS: 75.0000 mg | ORAL_TABLET | Freq: Every day | ORAL | 3 refills | Status: DC

## 2017-02-09 MED ORDER — CRISABOROLE 2 % EX OINT: 1.0000 "application " | TOPICAL_OINTMENT | Freq: Two times a day (BID) | CUTANEOUS | 1 refills | Status: DC

## 2017-02-09 MED ORDER — LOSARTAN POTASSIUM 50 MG PO TABS: 50.0000 mg | ORAL_TABLET | Freq: Every day | ORAL | 1 refills | Status: DC

## 2017-02-09 MED ORDER — METOPROLOL SUCCINATE ER 25 MG PO TB24: 25.0000 mg | ORAL_TABLET | Freq: Every day | ORAL | 1 refills | Status: DC

## 2017-02-09 MED ORDER — ESTRADIOL 2 MG PO TABS
2.0000 mg | ORAL_TABLET | Freq: Every day | ORAL | 3 refills | Status: DC
Start: 2017-02-09 — End: 2017-12-09

## 2017-02-09 MED ORDER — FINASTERIDE 5 MG PO TABS: 5.0000 mg | ORAL_TABLET | Freq: Every day | ORAL | 1 refills | Status: DC

## 2017-03-10 ENCOUNTER — Inpatient Hospital Stay: Payer: 59 | Attending: Internal Medicine

## 2017-03-17 ENCOUNTER — Inpatient Hospital Stay: Payer: 59 | Attending: Internal Medicine

## 2017-03-17 DIAGNOSIS — Z9221 Personal history of antineoplastic chemotherapy: Secondary | ICD-10-CM | POA: Diagnosis not present

## 2017-03-17 DIAGNOSIS — C8338 Diffuse large B-cell lymphoma, lymph nodes of multiple sites: Secondary | ICD-10-CM | POA: Insufficient documentation

## 2017-03-17 DIAGNOSIS — Z85820 Personal history of malignant melanoma of skin: Secondary | ICD-10-CM | POA: Insufficient documentation

## 2017-03-17 DIAGNOSIS — Z95828 Presence of other vascular implants and grafts: Secondary | ICD-10-CM

## 2017-03-17 DIAGNOSIS — Z452 Encounter for adjustment and management of vascular access device: Secondary | ICD-10-CM | POA: Diagnosis not present

## 2017-03-17 DIAGNOSIS — Z85828 Personal history of other malignant neoplasm of skin: Secondary | ICD-10-CM | POA: Insufficient documentation

## 2017-03-17 MED ORDER — SODIUM CHLORIDE 0.9% FLUSH
10.0000 mL | INTRAVENOUS | Status: DC | PRN
Start: 2017-03-17 — End: 2017-03-17
  Administered 2017-03-17: 10 mL via INTRAVENOUS
  Filled 2017-03-17: qty 10

## 2017-03-17 MED ORDER — HEPARIN SOD (PORK) LOCK FLUSH 100 UNIT/ML IV SOLN
INTRAVENOUS | Status: AC
  Filled 2017-03-17: qty 5

## 2017-03-17 MED ORDER — HEPARIN SOD (PORK) LOCK FLUSH 100 UNIT/ML IV SOLN
500.0000 [IU] | Freq: Once | INTRAVENOUS | Status: AC
  Administered 2017-03-17: 500 [IU] via INTRAVENOUS

## 2017-03-25 ENCOUNTER — Other Ambulatory Visit: Payer: Self-pay

## 2017-03-25 MED ORDER — CRISABOROLE 2 % EX OINT: 1.0000 "application " | TOPICAL_OINTMENT | Freq: Two times a day (BID) | CUTANEOUS | 1 refills | Status: DC

## 2017-03-30 ENCOUNTER — Ambulatory Visit: Payer: 59 | Admitting: Internal Medicine

## 2017-03-30 ENCOUNTER — Other Ambulatory Visit: Payer: 59

## 2017-04-08 ENCOUNTER — Other Ambulatory Visit: Payer: Self-pay

## 2017-04-20 ENCOUNTER — Inpatient Hospital Stay: Payer: 59 | Attending: Internal Medicine | Admitting: Internal Medicine

## 2017-04-20 ENCOUNTER — Inpatient Hospital Stay: Payer: 59

## 2017-04-20 VITALS — BP 155/96 | HR 61 | Temp 97.6°F | Resp 20 | Ht 70.0 in | Wt 201.1 lb

## 2017-04-20 DIAGNOSIS — R1011 Right upper quadrant pain: Secondary | ICD-10-CM | POA: Insufficient documentation

## 2017-04-20 DIAGNOSIS — F419 Anxiety disorder, unspecified: Secondary | ICD-10-CM | POA: Diagnosis not present

## 2017-04-20 DIAGNOSIS — K219 Gastro-esophageal reflux disease without esophagitis: Secondary | ICD-10-CM | POA: Diagnosis not present

## 2017-04-20 DIAGNOSIS — I10 Essential (primary) hypertension: Secondary | ICD-10-CM | POA: Insufficient documentation

## 2017-04-20 DIAGNOSIS — Z85820 Personal history of malignant melanoma of skin: Secondary | ICD-10-CM | POA: Insufficient documentation

## 2017-04-20 DIAGNOSIS — Z9221 Personal history of antineoplastic chemotherapy: Secondary | ICD-10-CM | POA: Insufficient documentation

## 2017-04-20 DIAGNOSIS — Z79899 Other long term (current) drug therapy: Secondary | ICD-10-CM | POA: Diagnosis not present

## 2017-04-20 DIAGNOSIS — Z7982 Long term (current) use of aspirin: Secondary | ICD-10-CM | POA: Diagnosis not present

## 2017-04-20 DIAGNOSIS — Z95828 Presence of other vascular implants and grafts: Secondary | ICD-10-CM

## 2017-04-20 DIAGNOSIS — E785 Hyperlipidemia, unspecified: Secondary | ICD-10-CM | POA: Diagnosis not present

## 2017-04-20 DIAGNOSIS — I2510 Atherosclerotic heart disease of native coronary artery without angina pectoris: Secondary | ICD-10-CM | POA: Insufficient documentation

## 2017-04-20 DIAGNOSIS — R21 Rash and other nonspecific skin eruption: Secondary | ICD-10-CM | POA: Diagnosis not present

## 2017-04-20 DIAGNOSIS — C8338 Diffuse large B-cell lymphoma, lymph nodes of multiple sites: Secondary | ICD-10-CM | POA: Diagnosis not present

## 2017-04-20 DIAGNOSIS — Z95818 Presence of other cardiac implants and grafts: Secondary | ICD-10-CM | POA: Diagnosis not present

## 2017-04-20 DIAGNOSIS — R1013 Epigastric pain: Secondary | ICD-10-CM | POA: Diagnosis not present

## 2017-04-20 DIAGNOSIS — C8334 Diffuse large B-cell lymphoma, lymph nodes of axilla and upper limb: Secondary | ICD-10-CM

## 2017-04-20 LAB — COMPREHENSIVE METABOLIC PANEL
ALT: 13 U/L — ABNORMAL LOW (ref 14–54)
ANION GAP: 7 (ref 5–15)
AST: 19 U/L (ref 15–41)
Albumin: 3.8 g/dL (ref 3.5–5.0)
Alkaline Phosphatase: 57 U/L (ref 38–126)
BUN: 11 mg/dL (ref 6–20)
CHLORIDE: 105 mmol/L (ref 101–111)
CO2: 25 mmol/L (ref 22–32)
Calcium: 8.5 mg/dL — ABNORMAL LOW (ref 8.9–10.3)
Creatinine, Ser: 0.85 mg/dL (ref 0.44–1.00)
GFR calc non Af Amer: >60 mL/min (ref >60)
Glucose, Bld: 107 mg/dL — ABNORMAL HIGH (ref 65–99)
Potassium: 3.5 mmol/L (ref 3.5–5.1)
SODIUM: 137 mmol/L (ref 135–145)
Total Bilirubin: 0.5 mg/dL (ref 0.3–1.2)
Total Protein: 7 g/dL (ref 6.5–8.1)

## 2017-04-20 LAB — CBC WITH DIFFERENTIAL/PLATELET
Basophils Absolute: 0.1 10*3/uL (ref 0–0.1)
Basophils Relative: 1 %
EOS ABS: 0.2 10*3/uL (ref 0–0.7)
EOS PCT: 3 %
HCT: 39.4 % (ref 35.0–47.0)
Hemoglobin: 13.2 g/dL (ref 12.0–16.0)
LYMPHS ABS: 2.2 10*3/uL (ref 1.0–3.6)
Lymphocytes Relative: 29 %
MCH: 27.6 pg (ref 26.0–34.0)
MCHC: 33.6 g/dL (ref 32.0–36.0)
MCV: 82.1 fL (ref 80.0–100.0)
MONOS PCT: 10 %
Monocytes Absolute: 0.8 10*3/uL (ref 0.2–0.9)
Neutro Abs: 4.4 10*3/uL (ref 1.4–6.5)
Neutrophils Relative %: 57 %
PLATELETS: 207 10*3/uL (ref 150–440)
RBC: 4.79 MIL/uL (ref 3.80–5.20)
RDW: 14.6 % — ABNORMAL HIGH (ref 11.5–14.5)
WBC: 7.6 10*3/uL (ref 3.6–11.0)

## 2017-04-20 LAB — LACTATE DEHYDROGENASE: LDH: 116 U/L (ref 98–192)

## 2017-04-20 MED ORDER — SODIUM CHLORIDE 0.9% FLUSH
10.0000 mL | INTRAVENOUS | Status: DC | PRN
  Administered 2017-04-20: 10 mL via INTRAVENOUS
  Filled 2017-04-20: qty 10

## 2017-04-20 MED ORDER — HEPARIN SOD (PORK) LOCK FLUSH 100 UNIT/ML IV SOLN
500.0000 [IU] | Freq: Once | INTRAVENOUS | Status: AC
  Administered 2017-04-20: 500 [IU] via INTRAVENOUS

## 2017-04-20 NOTE — Progress Notes (Signed)
[place] OFFICE PROGRESS NOTE  Patient Care Team: Glean Hess, MD as PCP - General (Family Medicine)  Cancer Staging Diffuse large B-cell lymphoma of lymph nodes of axilla [facility]) Staging form: Lymphoid Neoplasms, AJCC 6th Edition - Clinical: No stage assigned - Unsigned    Oncology History   # SEP 2015- .lymph node biopsy from the right axillary lymph node is positive for diffuse B. large cell lymphoma [activated B cell lymphoma];  BCL 6 rearrangement positive; NEG- mcy & bcl-2 gene rerranagement; MYC negative with activated B cells.  LDH is 333 done at lab-crp ; Stage IV [spleen POSITIVE]; bone marrow biopsy is negative for any involvement with lymphoma; She started chemotherapy with R. CHOP in September of 2015.Marland Kitchen PATIENT HAS FINISHED TOTAL 6 CYCLES OF CHEMOTHERAPY WITH r chop (jANUARY, 2016); PET scan shows complete response; Last PET FEB 2017  # Patient had a right medial breast lesion 1.5 cm tumorjunctional and lentiginous dysplastic nevus with mild-to-moderate atypia  #  Skin from left forearm hypertrophic actinic keratosis.  And superficial basal cell carcinoma.  From multiple area      Diffuse large B-cell lymphoma of lymph nodes of axilla ([facility])    INTERVAL HISTORY:  Kendra Salas 66 y.o.  female pleasant patient above history of Diffuse large B-cell lymphoma ABC subtype [stage IV; involvement of spleen] status post R CHOP finished in summer 2016 is here for follow-up.  Patient denies any new lumps or bumps. Appetite is good. No weight loss. No night sweats. Patient has intermittent right upper quadrant pain approximately once a week which she attributes to possible.   REVIEW OF SYSTEMS:  A complete 10 point review of system is done which is negative except mentioned above/history of present illness.   PAST MEDICAL HISTORY :  Past Medical History:  Diagnosis Date  . Anxiety   . CAD (coronary artery disease)   . Clotting disorder ([place])   . GERD  (gastroesophageal reflux disease)   . Hyperlipemia   . Hypertension   . Lymphoma ([place])   . Melanoma ([place])   . Personal history of chemotherapy     PAST SURGICAL HISTORY :   Past Surgical History:  Procedure Laterality Date  . AUGMENTATION MAMMAPLASTY Bilateral 2003  . CARDIAC CATHETERIZATION     X2 - has 5 stents. 4 LAD, 1 posterior, Last 3 placed 2004  . COLONOSCOPY    . ESOPHAGOGASTRODUODENOSCOPY (EGD) WITH PROPOFOL N/A 11/22/2015   Procedure: ESOPHAGOGASTRODUODENOSCOPY (EGD) WITH PROPOFOL;  Surgeon: Lucilla Lame, MD;  Location: [place];  Service: Endoscopy;  Laterality: N/A;  . FACIAL COSMETIC SURGERY    . gender re-affirmation    . HERNIA REPAIR     x2    FAMILY HISTORY :   Family History  Problem Relation Age of Onset  . Breast cancer Neg Hx     SOCIAL HISTORY:   Social History  Substance Use Topics  . Smoking status: Never Smoker  . Smokeless tobacco: Not on file  . Alcohol use 0.6 oz/week    1 Shots of liquor per week    ALLERGIES:  has No Known Allergies.  MEDICATIONS:  Current Outpatient Prescriptions  Medication Sig Dispense Refill  . aspirin 81 MG tablet Take 81 mg by mouth daily.    . clopidogrel (PLAVIX) 75 MG tablet Take 1 tablet (75 mg total) by mouth daily. 90 tablet 3  . Crisaborole (EUCRISA) 2 % OINT Apply 1 application topically 2 (two) times daily. 60 g  1  . dexlansoprazole (DEXILANT) 60 MG capsule Take 60 mg by mouth daily.    Marland Kitchen estradiol (ESTRACE) 2 MG tablet Take 1 tablet (2 mg total) by mouth daily. 90 tablet 3  . finasteride (PROSCAR) 5 MG tablet Take 1 tablet (5 mg total) by mouth daily. 90 tablet 1  . loratadine (CLARITIN) 10 MG tablet Take 1 tablet (10 mg total) by mouth daily. (Patient taking differently: Take 10 mg by mouth daily as needed. ) 30 tablet 11  . losartan (COZAAR) 50 MG tablet Take 1 tablet (50 mg total) by mouth daily. 90 tablet 1  . metoprolol succinate (TOPROL-XL) 25 MG 24 hr tablet Take 1 tablet (25 mg total)  by mouth daily. 90 tablet 1  . rosuvastatin (CRESTOR) 20 MG tablet Take 1 tablet (20 mg total) by mouth daily. 90 tablet 1  . hydrOXYzine (ATARAX/VISTARIL) 10 MG tablet Take 10 mg by mouth every 6 (six) hours as needed for itching.   1  . mupirocin ointment (BACTROBAN) 2 % Place 1 application into the nose 2 (two) times daily. (Patient not taking: Reported on 04/20/2017) 30 g 0  . nebivolol (BYSTOLIC) 2.5 MG tablet Take 2.5 mg by mouth daily.     No current facility-administered medications for this visit.    Facility-Administered Medications Ordered in Other Visits  Medication Dose Route Frequency Provider Last Rate Last Dose  . heparin lock flush 100 unit/mL  500 Units Intravenous Once Choksi, Janak, MD      . sodium chloride 0.9 % injection 10 mL  10 mL Intravenous PRN Choksi, Janak, MD      . sodium chloride flush (NS) 0.9 % injection 10 mL  10 mL Intravenous PRN Cammie Sickle, MD   10 mL at 04/20/17 1518    PHYSICAL EXAMINATION: ECOG PERFORMANCE STATUS: 0 - Asymptomatic  BP (!) 155/96 (BP Location: Right Arm, Patient Position: Sitting)   Pulse 61   Temp 97.6 F (36.4 C) (Tympanic)   Resp 20   Ht 5' 10"  (1.778 m)   Wt 201 lb 1 oz (91.2 kg)   BMI 28.85 kg/m   Filed Weights   04/20/17 1522  Weight: 201 lb 1 oz (91.2 kg)    GENERAL: Well-nourished well-developed; Alert, no distress and comfortable. Alone. EYES: no pallor or icterus OROPHARYNX: no thrush or ulceration; good dentition  NECK: supple, no masses felt LYMPH:  no palpable lymphadenopathy in the cervical, axillary or inguinal regions LUNGS: clear to auscultation and  No wheeze or crackles HEART/CVS: regular rate & rhythm and no murmurs; No lower extremity edema ABDOMEN:abdomen soft, non-tender and normal bowel sounds Musculoskeletal:no cyanosis of digits and no clubbing  PSYCH: alert & oriented x 3 with fluent speech NEURO: no focal motor/sensory deficits SKIN:  Macular papular rash noted  [chronic]  LABORATORY DATA:  I have reviewed the data as listed    Component Value Date/Time   NA 137 04/20/2017 1505   NA 139 10/16/2014 0845   K 3.5 04/20/2017 1505   K 3.7 10/16/2014 0845   CL 105 04/20/2017 1505   CL 107 10/16/2014 0845   CO2 25 04/20/2017 1505   CO2 25 10/16/2014 0845   GLUCOSE 107 (H) 04/20/2017 1505   GLUCOSE 80 10/16/2014 0845   BUN 11 04/20/2017 1505   BUN 11 10/16/2014 0845   CREATININE 0.85 04/20/2017 1505   CREATININE 0.85 10/16/2014 0845   CALCIUM 8.5 (L) 04/20/2017 1505   CALCIUM 8.1 (L) 10/16/2014 0845   PROT  7.0 04/20/2017 1505   PROT 6.4 10/16/2014 0845   ALBUMIN 3.8 04/20/2017 1505   ALBUMIN 3.1 (L) 10/16/2014 0845   AST 19 04/20/2017 1505   AST 23 10/16/2014 0845   ALT 13 (L) 04/20/2017 1505   ALT 18 10/16/2014 0845   ALKPHOS 57 04/20/2017 1505   ALKPHOS 47 10/16/2014 0845   BILITOT 0.5 04/20/2017 1505   BILITOT 0.3 10/16/2014 0845   GFRNONAA >60 04/20/2017 1505   GFRNONAA >60 10/16/2014 0845   GFRAA >60 04/20/2017 1505   GFRAA >60 10/16/2014 0845    No results found for: SPEP, UPEP  Lab Results  Component Value Date   WBC 7.6 04/20/2017   NEUTROABS 4.4 04/20/2017   HGB 13.2 04/20/2017   HCT 39.4 04/20/2017   MCV 82.1 04/20/2017   PLT 207 04/20/2017      Chemistry      Component Value Date/Time   NA 137 04/20/2017 1505   NA 139 10/16/2014 0845   K 3.5 04/20/2017 1505   K 3.7 10/16/2014 0845   CL 105 04/20/2017 1505   CL 107 10/16/2014 0845   CO2 25 04/20/2017 1505   CO2 25 10/16/2014 0845   BUN 11 04/20/2017 1505   BUN 11 10/16/2014 0845   CREATININE 0.85 04/20/2017 1505   CREATININE 0.85 10/16/2014 0845      Component Value Date/Time   CALCIUM 8.5 (L) 04/20/2017 1505   CALCIUM 8.1 (L) 10/16/2014 0845   ALKPHOS 57 04/20/2017 1505   ALKPHOS 47 10/16/2014 0845   AST 19 04/20/2017 1505   AST 23 10/16/2014 0845   ALT 13 (L) 04/20/2017 1505   ALT 18 10/16/2014 0845   BILITOT 0.5 04/20/2017 1505   BILITOT 0.3  10/16/2014 0845       RADIOGRAPHIC STUDIES: I have personally reviewed the radiological images as listed and agreed with the findings in the report. No results found.   ASSESSMENT & PLAN:  Diffuse large B-cell lymphoma of lymph nodes of axilla ([facility]) # DLBCL-ABC subtype status post 6 cycles of R CHOP; complete remission [finished chemotherapy summer of 2016]. Last PET scan in February 2017 NED. Clinically no evidence of recurrence. Labs reviewed within normal limits; cmp-pending.   # Epigastric discomfort- ? Gall bladder; defer to PCP/GI.   # CAD status post stenting. Stable.  # Port Flush every 6 weeks; keeping it for now. Plan to take it out  Take it out in the next 6-12 months.   #Bilateral chronic skin rash-inflammatory. Topical steroids/ deferred to dermatology [Dr.Kowalski]  # follow up in 6 months/ labs.   Orders Placed This Encounter  Procedures  . CBC with Differential/Platelet    Standing Status:   Future    Standing Expiration Date:   04/20/2018  . Comprehensive metabolic panel    Standing Status:   Future    Standing Expiration Date:   04/20/2018  . Lactate dehydrogenase    Standing Status:   Future    Standing Expiration Date:   04/20/2018   All questions were answered. The patient knows to call the clinic with any problems, questions or concerns.      Cammie Sickle, MD 04/20/2017 4:00 PM

## 2017-04-20 NOTE — Assessment & Plan Note (Addendum)
#   DLBCL-ABC subtype status post 6 cycles of R CHOP; complete remission [finished chemotherapy summer of 2016]. Last PET scan in February 2017 NED. Clinically no evidence of recurrence. Labs reviewed within normal limits; cmp-pending.   # Epigastric discomfort- ? Gall bladder; defer to PCP/GI.   # CAD status post stenting. Stable.  # Port Flush every 6 weeks; keeping it for now. Plan to take it out  Take it out in the next 6-12 months.   #Bilateral chronic skin rash-inflammatory. Topical steroids/ deferred to dermatology [Dr.Kowalski]  # follow up in 6 months/ labs.

## 2017-04-20 NOTE — Progress Notes (Signed)
Pt reports intermittent epigastric discomfort - occurs after she eats. No nausea/vomiting. Patient had an EGD in 2017. Pt denies any night sweats, fevers or chills. No other additional medical concerns.

## 2017-05-12 ENCOUNTER — Other Ambulatory Visit: Payer: Self-pay

## 2017-05-12 MED ORDER — CRISABOROLE 2 % EX OINT: 1.0000 "application " | TOPICAL_OINTMENT | Freq: Two times a day (BID) | CUTANEOUS | 1 refills | Status: DC

## 2017-05-19 ENCOUNTER — Other Ambulatory Visit: Payer: Self-pay | Admitting: Internal Medicine

## 2017-05-19 ENCOUNTER — Other Ambulatory Visit: Payer: Self-pay

## 2017-05-19 DIAGNOSIS — E782 Mixed hyperlipidemia: Secondary | ICD-10-CM

## 2017-05-20 ENCOUNTER — Other Ambulatory Visit: Payer: Self-pay

## 2017-06-01 ENCOUNTER — Inpatient Hospital Stay: Payer: 59 | Attending: Internal Medicine

## 2017-06-01 DIAGNOSIS — Z9221 Personal history of antineoplastic chemotherapy: Secondary | ICD-10-CM | POA: Diagnosis not present

## 2017-06-01 DIAGNOSIS — C8338 Diffuse large B-cell lymphoma, lymph nodes of multiple sites: Secondary | ICD-10-CM | POA: Diagnosis not present

## 2017-06-01 DIAGNOSIS — Z85820 Personal history of malignant melanoma of skin: Secondary | ICD-10-CM | POA: Insufficient documentation

## 2017-06-01 DIAGNOSIS — Z452 Encounter for adjustment and management of vascular access device: Secondary | ICD-10-CM | POA: Diagnosis not present

## 2017-06-01 DIAGNOSIS — Z95828 Presence of other vascular implants and grafts: Secondary | ICD-10-CM

## 2017-06-01 MED ORDER — HEPARIN SOD (PORK) LOCK FLUSH 100 UNIT/ML IV SOLN
500.0000 [IU] | Freq: Once | INTRAVENOUS | Status: AC
  Administered 2017-06-01: 500 [IU] via INTRAVENOUS
  Filled 2017-06-01: qty 5

## 2017-06-01 MED ORDER — SODIUM CHLORIDE 0.9% FLUSH
10.0000 mL | INTRAVENOUS | Status: DC | PRN
  Administered 2017-06-01: 10 mL via INTRAVENOUS
  Filled 2017-06-01: qty 10

## 2017-06-30 ENCOUNTER — Other Ambulatory Visit: Payer: Self-pay

## 2017-06-30 MED ORDER — METRONIDAZOLE 500 MG PO TABS: 500.0000 mg | ORAL_TABLET | Freq: Four times a day (QID) | ORAL | 0 refills | Status: DC

## 2017-06-30 MED ORDER — SULFAMETHOXAZOLE-TRIMETHOPRIM 800-160 MG PO TABS: 1.0000 | ORAL_TABLET | Freq: Two times a day (BID) | ORAL | 0 refills | Status: DC

## 2017-07-05 ENCOUNTER — Telehealth: Payer: Self-pay | Admitting: *Deleted

## 2017-07-05 NOTE — Telephone Encounter (Signed)
Dr. Brahmanday made aware. 

## 2017-07-05 NOTE — Telephone Encounter (Signed)
-----   Message from Secundino Ginger sent at 07/05/2017  8:06 AM EDT ----- Regarding: pain Dr Otilio Miu called she is having pain in lower left quad radiating to her back, no blood in urine. Started last Tuesday and worsened over the w/e. Any movements cause her great pain. I've got her on your schedule tomorrow @ [facility] but felt I should send a note.

## 2017-07-06 ENCOUNTER — Inpatient Hospital Stay: Payer: 59 | Attending: Internal Medicine | Admitting: Internal Medicine

## 2017-07-06 ENCOUNTER — Inpatient Hospital Stay: Payer: 59

## 2017-07-06 VITALS — BP 161/103 | HR 74 | Temp 97.7°F | Resp 20 | Ht 70.0 in | Wt 198.2 lb

## 2017-07-06 DIAGNOSIS — Z85820 Personal history of malignant melanoma of skin: Secondary | ICD-10-CM | POA: Insufficient documentation

## 2017-07-06 DIAGNOSIS — Z95828 Presence of other vascular implants and grafts: Secondary | ICD-10-CM

## 2017-07-06 DIAGNOSIS — Z955 Presence of coronary angioplasty implant and graft: Secondary | ICD-10-CM | POA: Diagnosis not present

## 2017-07-06 DIAGNOSIS — I10 Essential (primary) hypertension: Secondary | ICD-10-CM | POA: Diagnosis not present

## 2017-07-06 DIAGNOSIS — K219 Gastro-esophageal reflux disease without esophagitis: Secondary | ICD-10-CM | POA: Insufficient documentation

## 2017-07-06 DIAGNOSIS — E785 Hyperlipidemia, unspecified: Secondary | ICD-10-CM | POA: Diagnosis not present

## 2017-07-06 DIAGNOSIS — Z79899 Other long term (current) drug therapy: Secondary | ICD-10-CM | POA: Diagnosis not present

## 2017-07-06 DIAGNOSIS — Z9221 Personal history of antineoplastic chemotherapy: Secondary | ICD-10-CM | POA: Diagnosis not present

## 2017-07-06 DIAGNOSIS — R1032 Left lower quadrant pain: Secondary | ICD-10-CM | POA: Diagnosis not present

## 2017-07-06 DIAGNOSIS — Z7982 Long term (current) use of aspirin: Secondary | ICD-10-CM | POA: Diagnosis not present

## 2017-07-06 DIAGNOSIS — R10824 Left lower quadrant rebound abdominal tenderness: Secondary | ICD-10-CM | POA: Insufficient documentation

## 2017-07-06 DIAGNOSIS — K5900 Constipation, unspecified: Secondary | ICD-10-CM | POA: Insufficient documentation

## 2017-07-06 DIAGNOSIS — I2510 Atherosclerotic heart disease of native coronary artery without angina pectoris: Secondary | ICD-10-CM | POA: Insufficient documentation

## 2017-07-06 DIAGNOSIS — C8334 Diffuse large B-cell lymphoma, lymph nodes of axilla and upper limb: Secondary | ICD-10-CM | POA: Diagnosis not present

## 2017-07-06 LAB — COMPREHENSIVE METABOLIC PANEL
ALK PHOS: 56 U/L (ref 38–126)
ALT: 22 U/L (ref 14–54)
ANION GAP: 8 (ref 5–15)
AST: 29 U/L (ref 15–41)
Albumin: 4.4 g/dL (ref 3.5–5.0)
BILIRUBIN TOTAL: 0.4 mg/dL (ref 0.3–1.2)
BUN: 13 mg/dL (ref 6–20)
CO2: 24 mmol/L (ref 22–32)
Calcium: 8.5 mg/dL — ABNORMAL LOW (ref 8.9–10.3)
Chloride: 104 mmol/L (ref 101–111)
Creatinine, Ser: 0.93 mg/dL (ref 0.44–1.00)
GFR calc non Af Amer: >60 mL/min (ref >60)
GLUCOSE: 121 mg/dL — AB (ref 65–99)
Potassium: 3.7 mmol/L (ref 3.5–5.1)
SODIUM: 136 mmol/L (ref 135–145)
Total Protein: 7.6 g/dL (ref 6.5–8.1)

## 2017-07-06 LAB — CBC WITH DIFFERENTIAL/PLATELET
Basophils Absolute: 0.1 10*3/uL (ref 0–0.1)
Basophils Relative: 1 %
EOS ABS: 0.2 10*3/uL (ref 0–0.7)
Eosinophils Relative: 3 %
HCT: 40.4 % (ref 35.0–47.0)
HEMOGLOBIN: 14 g/dL (ref 12.0–16.0)
LYMPHS ABS: 2 10*3/uL (ref 1.0–3.6)
Lymphocytes Relative: 26 %
MCH: 28.3 pg (ref 26.0–34.0)
MCHC: 34.6 g/dL (ref 32.0–36.0)
MCV: 82 fL (ref 80.0–100.0)
MONO ABS: 0.8 10*3/uL (ref 0.2–0.9)
MONOS PCT: 10 %
NEUTROS PCT: 60 %
Neutro Abs: 4.6 10*3/uL (ref 1.4–6.5)
Platelets: 217 10*3/uL (ref 150–440)
RBC: 4.93 MIL/uL (ref 3.80–5.20)
RDW: 15.2 % — ABNORMAL HIGH (ref 11.5–14.5)
WBC: 7.7 10*3/uL (ref 3.6–11.0)

## 2017-07-06 LAB — LACTATE DEHYDROGENASE: LDH: 130 U/L (ref 98–192)

## 2017-07-06 MED ORDER — SODIUM CHLORIDE 0.9% FLUSH
10.0000 mL | INTRAVENOUS | Status: DC | PRN
  Administered 2017-07-06: 10 mL via INTRAVENOUS
  Filled 2017-07-06: qty 10

## 2017-07-06 MED ORDER — HEPARIN SOD (PORK) LOCK FLUSH 100 UNIT/ML IV SOLN
500.0000 [IU] | Freq: Once | INTRAVENOUS | Status: AC
  Administered 2017-07-06: 500 [IU] via INTRAVENOUS

## 2017-07-06 NOTE — Progress Notes (Signed)
Patient here for acute add on to evaluate her acute pain in left groin.  Rates pain 6/10-reports pain is worse with activity and movement. Reports that she is unable to get out of bed in lying down position when she turns on her left side. Pt reports that she has to rotate her body to the right side and sit up. Unable to lift left leg without pain/discomfort. no h/o trauma r/t falls/no h/o heavy lifting/pushing/pulling. Reports that pain also becomes intense when she takes a deep breath. Pt states that her pcp ordered prophlactic abx with flagyl and bactrim DS. Pt finished this course of abx yesterday.pt was concerned that she may have kidney stones. Pt reports no blood in her stools or urine. Pt reports h/o splenomegaly.

## 2017-07-06 NOTE — Progress Notes (Signed)
[place] OFFICE PROGRESS NOTE  Patient Care Team: Glean Hess, MD as PCP - General (Family Medicine)  Cancer Staging Diffuse large B-cell lymphoma of lymph nodes of axilla [facility]) Staging form: Lymphoid Neoplasms, AJCC 6th Edition - Clinical: No stage assigned - Unsigned    Oncology History   # SEP 2015- .lymph node biopsy from the right axillary lymph node is positive for diffuse B. large cell lymphoma [activated B cell lymphoma];  BCL 6 rearrangement positive; NEG- mcy & bcl-2 gene rerranagement; MYC negative with activated B cells.  LDH is 333 done at lab-crp ; Stage IV [spleen POSITIVE]; bone marrow biopsy is negative for any involvement with lymphoma; She started chemotherapy with R. CHOP in September of 2015.Marland Kitchen PATIENT HAS FINISHED TOTAL 6 CYCLES OF CHEMOTHERAPY WITH r chop (jANUARY, 2016); PET scan shows complete response; Last PET FEB 2017  # Patient had a right medial breast lesion 1.5 cm tumorjunctional and lentiginous dysplastic nevus with mild-to-moderate atypia  #  Skin from left forearm hypertrophic actinic keratosis.  And superficial basal cell carcinoma.  From multiple area      Diffuse large B-cell lymphoma of lymph nodes of axilla (HCC)    INTERVAL HISTORY:  Kendra Salas 66 y.o.  female pleasant patient above history of Diffuse large B-cell lymphoma ABC subtype [stage IV; involvement of spleen] status post R CHOP finished in summer 2016- Is here for to be evaluated for left lower quadrant abdominal pain.  Patient notes to have left lower quadrant abdominal pain that started approximately one week ago. Exacerbated by taking deep breaths. No radiation. Progressively getting worse over the last 1 week. No nausea no vomiting. No fevers.  Patient is currently on Bactrim and Flagyl without significant improvement. Mild constipation. Stool occult blood negative as per patient  Patient denies any new lumps or bumps. No weight loss. No night  sweats.  REVIEW OF SYSTEMS:  A complete 10 point review of system is done which is negative except mentioned above/history of present illness.   PAST MEDICAL HISTORY :  Past Medical History:  Diagnosis Date  . Anxiety   . CAD (coronary artery disease)   . Clotting disorder ([place])   . GERD (gastroesophageal reflux disease)   . Hyperlipemia   . Hypertension   . Lymphoma ([place])   . Melanoma ([place])   . Personal history of chemotherapy     PAST SURGICAL HISTORY :   Past Surgical History:  Procedure Laterality Date  . AUGMENTATION MAMMAPLASTY Bilateral 2003  . CARDIAC CATHETERIZATION     X2 - has 5 stents. 4 LAD, 1 posterior, Last 3 placed 2004  . COLONOSCOPY    . ESOPHAGOGASTRODUODENOSCOPY (EGD) WITH PROPOFOL N/A 11/22/2015   Procedure: ESOPHAGOGASTRODUODENOSCOPY (EGD) WITH PROPOFOL;  Surgeon: Lucilla Lame, MD;  Location: [place];  Service: Endoscopy;  Laterality: N/A;  . FACIAL COSMETIC SURGERY    . gender re-affirmation    . HERNIA REPAIR     x2    FAMILY HISTORY :   Family History  Problem Relation Age of Onset  . Breast cancer Neg Hx     SOCIAL HISTORY:   Social History  Substance Use Topics  . Smoking status: Never Smoker  . Smokeless tobacco: Not on file  . Alcohol use 0.6 oz/week    1 Shots of liquor per week    ALLERGIES:  has No Known Allergies.  MEDICATIONS:  Current Outpatient Prescriptions  Medication Sig Dispense Refill  . aspirin 81 MG  tablet Take 81 mg by mouth daily.    . clopidogrel (PLAVIX) 75 MG tablet Take 1 tablet (75 mg total) by mouth daily. 90 tablet 3  . Crisaborole (EUCRISA) 2 % OINT Apply 1 application topically 2 (two) times daily. 60 g 1  . dexlansoprazole (DEXILANT) 60 MG capsule Take 60 mg by mouth daily.    Marland Kitchen estradiol (ESTRACE) 2 MG tablet Take 1 tablet (2 mg total) by mouth daily. 90 tablet 3  . finasteride (PROSCAR) 5 MG tablet Take 1 tablet (5 mg total) by mouth daily. 90 tablet 1  . hydrOXYzine (ATARAX/VISTARIL) 10 MG  tablet Take 10 mg by mouth every 6 (six) hours as needed for itching.   1  . loratadine (CLARITIN) 10 MG tablet Take 1 tablet (10 mg total) by mouth daily. (Patient taking differently: Take 10 mg by mouth daily as needed. ) 30 tablet 11  . losartan (COZAAR) 50 MG tablet Take 1 tablet (50 mg total) by mouth daily. 90 tablet 1  . metoprolol succinate (TOPROL-XL) 25 MG 24 hr tablet Take 1 tablet (25 mg total) by mouth daily. 90 tablet 1  . mupirocin ointment (BACTROBAN) 2 % Place 1 application into the nose 2 (two) times daily. 30 g 0  . nebivolol (BYSTOLIC) 2.5 MG tablet Take 2.5 mg by mouth daily.    . rosuvastatin (CRESTOR) 20 MG tablet TAKE 1 TABLET (20 MG TOTAL) BY MOUTH DAILY. 90 tablet 1  . metroNIDAZOLE (FLAGYL) 500 MG tablet Take 1 tablet (500 mg total) by mouth 4 (four) times daily. (Patient not taking: Reported on 07/06/2017) 28 tablet 0  . sulfamethoxazole-trimethoprim (BACTRIM DS,SEPTRA DS) 800-160 MG tablet Take 1 tablet by mouth 2 (two) times daily. (Patient not taking: Reported on 07/06/2017) 20 tablet 0   No current facility-administered medications for this visit.    Facility-Administered Medications Ordered in Other Visits  Medication Dose Route Frequency Provider Last Rate Last Dose  . heparin lock flush 100 unit/mL  500 Units Intravenous Once Choksi, Janak, MD      . sodium chloride 0.9 % injection 10 mL  10 mL Intravenous PRN Forest Gleason, MD        PHYSICAL EXAMINATION: ECOG PERFORMANCE STATUS: 0 - Asymptomatic  BP (!) 161/103 (Patient Position: Sitting)   Pulse 74   Temp 97.7 F (36.5 C) (Tympanic)   Resp 20   Ht _0  (1.778 m)   Wt 198 lb 3.1 oz (89.9 kg)   BMI 28.44 kg/m   Filed Weights   07/06/17 1325  Weight: 198 lb 3.1 oz (89.9 kg)    GENERAL: Well-nourished well-developed; Alert, no distress and comfortable. Alone. EYES: no pallor or icterus OROPHARYNX: no thrush or ulceration; good dentition  NECK: supple, no masses felt LYMPH:  no palpable  lymphadenopathy in the cervical, axillary or inguinal regions LUNGS: clear to auscultation and  No wheeze or crackles HEART/CVS: regular rate & rhythm and no murmurs; No lower extremity edema ABDOMEN:abdomen soft, Left lower quadrant tenderness and normal bowel sounds Musculoskeletal:no cyanosis of digits and no clubbing  PSYCH: alert & oriented x 3 with fluent speech NEURO: no focal motor/sensory deficits SKIN:  Macular papular rash noted [chronic]  LABORATORY DATA:  I have reviewed the data as listed    Component Value Date/Time   NA 137 04/20/2017 1505   NA 139 10/16/2014 0845   K 3.5 04/20/2017 1505   K 3.7 10/16/2014 0845   CL 105 04/20/2017 1505   CL 107 10/16/2014 0845  CO2 25 04/20/2017 1505   CO2 25 10/16/2014 0845   GLUCOSE 107 (H) 04/20/2017 1505   GLUCOSE 80 10/16/2014 0845   BUN 11 04/20/2017 1505   BUN 11 10/16/2014 0845   CREATININE 0.85 04/20/2017 1505   CREATININE 0.85 10/16/2014 0845   CALCIUM 8.5 (L) 04/20/2017 1505   CALCIUM 8.1 (L) 10/16/2014 0845   PROT 7.0 04/20/2017 1505   PROT 6.4 10/16/2014 0845   ALBUMIN 3.8 04/20/2017 1505   ALBUMIN 3.1 (L) 10/16/2014 0845   AST 19 04/20/2017 1505   AST 23 10/16/2014 0845   ALT 13 (L) 04/20/2017 1505   ALT 18 10/16/2014 0845   ALKPHOS 57 04/20/2017 1505   ALKPHOS 47 10/16/2014 0845   BILITOT 0.5 04/20/2017 1505   BILITOT 0.3 10/16/2014 0845   GFRNONAA >60 04/20/2017 1505   GFRNONAA >60 10/16/2014 0845   GFRAA >60 04/20/2017 1505   GFRAA >60 10/16/2014 0845    No results found for: SPEP, UPEP  Lab Results  Component Value Date   WBC 7.6 04/20/2017   NEUTROABS 4.4 04/20/2017   HGB 13.2 04/20/2017   HCT 39.4 04/20/2017   MCV 82.1 04/20/2017   PLT 207 04/20/2017      Chemistry      Component Value Date/Time   NA 137 04/20/2017 1505   NA 139 10/16/2014 0845   K 3.5 04/20/2017 1505   K 3.7 10/16/2014 0845   CL 105 04/20/2017 1505   CL 107 10/16/2014 0845   CO2 25 04/20/2017 1505   CO2 25  10/16/2014 0845   BUN 11 04/20/2017 1505   BUN 11 10/16/2014 0845   CREATININE 0.85 04/20/2017 1505   CREATININE 0.85 10/16/2014 0845      Component Value Date/Time   CALCIUM 8.5 (L) 04/20/2017 1505   CALCIUM 8.1 (L) 10/16/2014 0845   ALKPHOS 57 04/20/2017 1505   ALKPHOS 47 10/16/2014 0845   AST 19 04/20/2017 1505   AST 23 10/16/2014 0845   ALT 13 (L) 04/20/2017 1505   ALT 18 10/16/2014 0845   BILITOT 0.5 04/20/2017 1505   BILITOT 0.3 10/16/2014 0845       RADIOGRAPHIC STUDIES: I have personally reviewed the radiological images as listed and agreed with the findings in the report. No results found.   ASSESSMENT & PLAN:  Diffuse large B-cell lymphoma of lymph nodes of axilla (HCC) # DLBCL-ABC subtype status post 6 cycles of R CHOP; complete remission [finished chemotherapy summer of 2016]. Last PET scan in February 2017 NED.  # Left lower quadrant abdominal pain- ? Etiology. Lymphoma versus diverticulitis versus others. Recommend a stat CT scan abdomen and pelvis with contrast. CHeck labs today.  # CAD status post stenting. Stable.  # Follow-up based upon results of the CT scans; port flush today.   Orders Placed This Encounter  Procedures  . CT ABDOMEN PELVIS W CONTRAST    Standing Status:   Future    Standing Expiration Date:   07/06/2018    Order Specific Question:   If indicated for the ordered procedure, I authorize the administration of contrast media per Radiology protocol    Answer:   Yes    Order Specific Question:   Preferred imaging location?    Answer:   [facility] [place]    Order Specific Question:   Call Results- Best Contact Number?    AnswerThurman Coyer- [phone]    Order Specific Question:   Radiology Contrast Protocol - do NOT remove file path  Answer:   [email or web address]   All questions were answered. The patient knows to call the clinic with any problems, questions or concerns.      Cammie Sickle,  MD 07/06/2017 2:07 PM

## 2017-07-06 NOTE — Assessment & Plan Note (Addendum)
#   DLBCL-ABC subtype status post 6 cycles of R CHOP; complete remission [finished chemotherapy summer of 2016]. Last PET scan in February 2017 NED.  # Left lower quadrant abdominal pain- ? Etiology. Lymphoma versus diverticulitis versus others. Recommend a stat CT scan abdomen and pelvis with contrast. CHeck labs today.  # CAD status post stenting. Stable.  # Follow-up based upon results of the CT scans; port flush today.

## 2017-07-07 ENCOUNTER — Telehealth: Payer: Self-pay | Admitting: *Deleted

## 2017-07-07 ENCOUNTER — Ambulatory Visit
Admission: RE | Admit: 2017-07-07 | Discharge: 2017-07-07 | Disposition: A | Discharge status: Home / Self Care | Payer: 59 | Source: Ambulatory Visit | Attending: Internal Medicine | Admitting: Internal Medicine

## 2017-07-07 ENCOUNTER — Telehealth: Payer: Self-pay | Admitting: Internal Medicine

## 2017-07-07 DIAGNOSIS — C8334 Diffuse large B-cell lymphoma, lymph nodes of axilla and upper limb: Secondary | ICD-10-CM | POA: Diagnosis not present

## 2017-07-07 DIAGNOSIS — K5730 Diverticulosis of large intestine without perforation or abscess without bleeding: Secondary | ICD-10-CM | POA: Diagnosis not present

## 2017-07-07 DIAGNOSIS — R9341 Abnormal radiologic findings on diagnostic imaging of renal pelvis, ureter, or bladder: Secondary | ICD-10-CM | POA: Insufficient documentation

## 2017-07-07 DIAGNOSIS — K429 Umbilical hernia without obstruction or gangrene: Secondary | ICD-10-CM | POA: Diagnosis not present

## 2017-07-07 DIAGNOSIS — K449 Diaphragmatic hernia without obstruction or gangrene: Secondary | ICD-10-CM | POA: Diagnosis not present

## 2017-07-07 DIAGNOSIS — R1032 Left lower quadrant pain: Secondary | ICD-10-CM | POA: Diagnosis not present

## 2017-07-07 MED ORDER — IOPAMIDOL (ISOVUE-300) INJECTION 61%
100.0000 mL | Freq: Once | INTRAVENOUS | Status: AC | PRN
  Administered 2017-07-07: 100 mL via INTRAVENOUS

## 2017-07-07 NOTE — Telephone Encounter (Signed)
Lab orders entered for 6 month labs

## 2017-07-07 NOTE — Addendum Note (Signed)
Addended by: Sabino Gasser on: 07/07/2017 01:46 PM   Modules accepted: Orders

## 2017-07-07 NOTE — Telephone Encounter (Signed)
Spoke to pt re: results of CT scan; recommend UA/Culture [with PCP].  Please schedule: Follow up with me in 6 months/labs- cbc/cmp/ldh; and port flush every 2 months.

## 2017-07-07 NOTE — Telephone Encounter (Signed)
md already spoke to patient about results at 1330

## 2017-07-07 NOTE — Telephone Encounter (Signed)
Called report  IMPRESSION: Colonic diverticulosis, without radiographic evidence of diverticulitis.  Mild diffuse wall thickening of urinary bladder, suspicious for cystitis. Recommend correlation with urinalysis. Tiny amount of gas within the urinary bladder, likely due to recent catheterization ; clinical correlation recommended.  Stable small hiatal hernia and fat-containing paraumbilical hernia.   Electronically Signed   By: Earle Gell M.D.   On: 07/07/2017 10:47

## 2017-07-09 ENCOUNTER — Other Ambulatory Visit: Payer: Self-pay

## 2017-07-09 MED ORDER — AMOXICILLIN 500 MG PO CAPS: 500.0000 mg | ORAL_CAPSULE | Freq: Three times a day (TID) | ORAL | 0 refills | Status: DC

## 2017-07-13 ENCOUNTER — Inpatient Hospital Stay: Payer: 59

## 2017-07-19 ENCOUNTER — Other Ambulatory Visit: Payer: Self-pay

## 2017-07-19 MED ORDER — CRISABOROLE 2 % EX OINT: 1.0000 "application " | TOPICAL_OINTMENT | Freq: Two times a day (BID) | CUTANEOUS | 1 refills | Status: DC

## 2017-07-22 ENCOUNTER — Other Ambulatory Visit: Payer: Self-pay

## 2017-07-23 ENCOUNTER — Other Ambulatory Visit: Payer: Self-pay

## 2017-07-27 ENCOUNTER — Other Ambulatory Visit: Payer: 59

## 2017-07-27 ENCOUNTER — Inpatient Hospital Stay: Payer: 59

## 2017-07-27 DIAGNOSIS — E782 Mixed hyperlipidemia: Secondary | ICD-10-CM

## 2017-07-28 ENCOUNTER — Other Ambulatory Visit: Payer: Self-pay

## 2017-07-29 ENCOUNTER — Other Ambulatory Visit: Payer: Self-pay

## 2017-07-29 LAB — LIPID PANEL
CHOLESTEROL TOTAL: 131 mg/dL (ref 100–199)
Chol/HDL Ratio: 3.4 ratio (ref 0.0–4.4)
HDL: 39 mg/dL — ABNORMAL LOW (ref >39)
LDL Calculated: 66 mg/dL (ref 0–99)
Triglycerides: 129 mg/dL (ref 0–149)
VLDL CHOLESTEROL CAL: 26 mg/dL (ref 5–40)

## 2017-07-29 LAB — GLUCOSE, RANDOM: GLUCOSE: 90 mg/dL (ref 65–99)

## 2017-08-24 ENCOUNTER — Inpatient Hospital Stay: Payer: 59 | Attending: Internal Medicine

## 2017-08-24 DIAGNOSIS — Z9221 Personal history of antineoplastic chemotherapy: Secondary | ICD-10-CM | POA: Insufficient documentation

## 2017-08-24 DIAGNOSIS — Z452 Encounter for adjustment and management of vascular access device: Secondary | ICD-10-CM | POA: Insufficient documentation

## 2017-08-24 DIAGNOSIS — C8334 Diffuse large B-cell lymphoma, lymph nodes of axilla and upper limb: Secondary | ICD-10-CM | POA: Insufficient documentation

## 2017-08-25 ENCOUNTER — Inpatient Hospital Stay: Payer: 59

## 2017-08-25 DIAGNOSIS — Z9221 Personal history of antineoplastic chemotherapy: Secondary | ICD-10-CM | POA: Diagnosis not present

## 2017-08-25 DIAGNOSIS — Z452 Encounter for adjustment and management of vascular access device: Secondary | ICD-10-CM | POA: Diagnosis not present

## 2017-08-25 DIAGNOSIS — C8334 Diffuse large B-cell lymphoma, lymph nodes of axilla and upper limb: Secondary | ICD-10-CM | POA: Diagnosis not present

## 2017-08-25 DIAGNOSIS — Z95828 Presence of other vascular implants and grafts: Secondary | ICD-10-CM

## 2017-08-25 MED ORDER — SODIUM CHLORIDE 0.9% FLUSH
10.0000 mL | INTRAVENOUS | Status: DC | PRN
  Administered 2017-08-25: 10 mL via INTRAVENOUS
  Filled 2017-08-25: qty 10

## 2017-08-25 MED ORDER — HEPARIN SOD (PORK) LOCK FLUSH 100 UNIT/ML IV SOLN
500.0000 [IU] | Freq: Once | INTRAVENOUS | Status: AC
  Administered 2017-08-25: 500 [IU] via INTRAVENOUS

## 2017-09-07 ENCOUNTER — Other Ambulatory Visit: Payer: Self-pay

## 2017-10-06 ENCOUNTER — Other Ambulatory Visit: Payer: Self-pay

## 2017-10-06 ENCOUNTER — Inpatient Hospital Stay: Payer: 59

## 2017-10-06 DIAGNOSIS — I10 Essential (primary) hypertension: Secondary | ICD-10-CM

## 2017-10-06 MED ORDER — METOPROLOL SUCCINATE ER 25 MG PO TB24: 25.0000 mg | ORAL_TABLET | Freq: Every day | ORAL | 1 refills | Status: DC

## 2017-10-12 ENCOUNTER — Inpatient Hospital Stay: Payer: 59

## 2017-10-12 ENCOUNTER — Other Ambulatory Visit: Payer: 59

## 2017-10-12 ENCOUNTER — Ambulatory Visit: Payer: 59 | Admitting: Internal Medicine

## 2017-10-13 ENCOUNTER — Other Ambulatory Visit: Payer: Self-pay

## 2017-10-13 MED ORDER — ONDANSETRON HCL 4 MG PO TABS: 4.0000 mg | ORAL_TABLET | Freq: Three times a day (TID) | ORAL | 0 refills | Status: DC | PRN

## 2017-10-19 ENCOUNTER — Inpatient Hospital Stay: Payer: 59 | Attending: Internal Medicine

## 2017-10-19 DIAGNOSIS — Z452 Encounter for adjustment and management of vascular access device: Secondary | ICD-10-CM | POA: Insufficient documentation

## 2017-10-19 DIAGNOSIS — Z95828 Presence of other vascular implants and grafts: Secondary | ICD-10-CM

## 2017-10-19 DIAGNOSIS — C8334 Diffuse large B-cell lymphoma, lymph nodes of axilla and upper limb: Secondary | ICD-10-CM | POA: Diagnosis not present

## 2017-10-19 MED ORDER — HEPARIN SOD (PORK) LOCK FLUSH 100 UNIT/ML IV SOLN
500.0000 [IU] | Freq: Once | INTRAVENOUS | Status: AC
  Administered 2017-10-19: 500 [IU] via INTRAVENOUS

## 2017-10-19 MED ORDER — SODIUM CHLORIDE 0.9% FLUSH
10.0000 mL | INTRAVENOUS | Status: DC | PRN
  Administered 2017-10-19: 10 mL via INTRAVENOUS
  Filled 2017-10-19: qty 10

## 2017-10-27 ENCOUNTER — Other Ambulatory Visit: Payer: Self-pay

## 2017-10-27 DIAGNOSIS — I10 Essential (primary) hypertension: Secondary | ICD-10-CM

## 2017-10-27 MED ORDER — LOSARTAN POTASSIUM 50 MG PO TABS: 50.0000 mg | ORAL_TABLET | Freq: Every day | ORAL | 1 refills | Status: DC

## 2017-11-10 ENCOUNTER — Other Ambulatory Visit: Payer: Self-pay

## 2017-11-10 DIAGNOSIS — L309 Dermatitis, unspecified: Secondary | ICD-10-CM

## 2017-11-10 MED ORDER — TRIAMCINOLONE ACETONIDE 0.1 % EX CREA: 1.0000 "application " | TOPICAL_CREAM | Freq: Two times a day (BID) | CUTANEOUS | 0 refills | Status: DC

## 2017-11-30 ENCOUNTER — Inpatient Hospital Stay: Payer: 59 | Attending: Internal Medicine

## 2017-12-09 ENCOUNTER — Other Ambulatory Visit: Payer: Self-pay | Admitting: Internal Medicine

## 2017-12-09 DIAGNOSIS — C8330 Diffuse large B-cell lymphoma, unspecified site: Secondary | ICD-10-CM

## 2017-12-09 DIAGNOSIS — Z79890 Hormone replacement therapy (postmenopausal): Secondary | ICD-10-CM

## 2017-12-09 MED ORDER — ESTRADIOL 2 MG PO TABS: 2.0000 mg | ORAL_TABLET | Freq: Every day | ORAL | 3 refills | Status: DC

## 2017-12-09 MED ORDER — HYDROXYZINE HCL 10 MG PO TABS: 10.0000 mg | ORAL_TABLET | Freq: Four times a day (QID) | ORAL | 3 refills | Status: DC | PRN

## 2017-12-09 MED ORDER — DOXEPIN HCL 10 MG PO CAPS: 10.0000 mg | ORAL_CAPSULE | Freq: Every day | ORAL | 3 refills | Status: DC

## 2017-12-10 ENCOUNTER — Other Ambulatory Visit: Payer: Self-pay | Admitting: Internal Medicine

## 2017-12-10 ENCOUNTER — Other Ambulatory Visit: Payer: Self-pay

## 2017-12-10 DIAGNOSIS — E782 Mixed hyperlipidemia: Secondary | ICD-10-CM

## 2017-12-17 IMAGING — CT CT ABD-PELV W/ CM
2 of 5 series · 16 of 46 positions shown, 18 images · IV contrast (iopamidol)
Comparison: 12/03/2015

CLINICAL DATA: Left lower quadrant pain for 10 days.
Diverticulosis. Personal history of diffuse large B-cell lymphoma.

EXAM:
CT ABDOMEN AND PELVIS WITH CONTRAST
TECHNIQUE: Multidetector CT imaging of the abdomen and pelvis was performed
using the standard protocol following bolus administration of
intravenous contrast.
CONTRAST:  100mL U6GE7X-3SS IOPAMIDOL (U6GE7X-3SS) INJECTION 61%

[Series 2: axial soft tissue · axial · 0.82mm/px · z∈[-882,-407]mm · 13 of 107 slices shown, 15 images]
[im 6/107  soft-tissue]
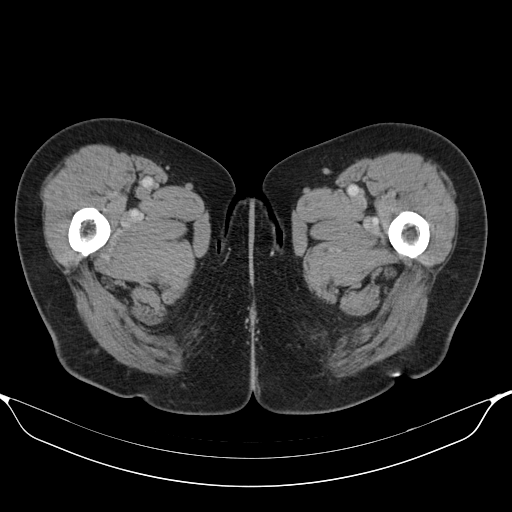
[im 6/107  bone]
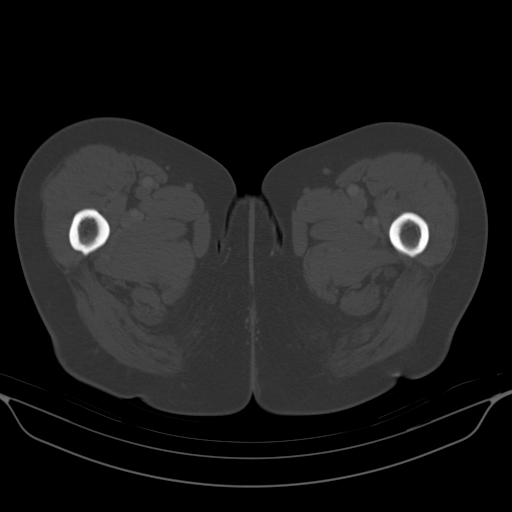
[im 12/107  soft-tissue]
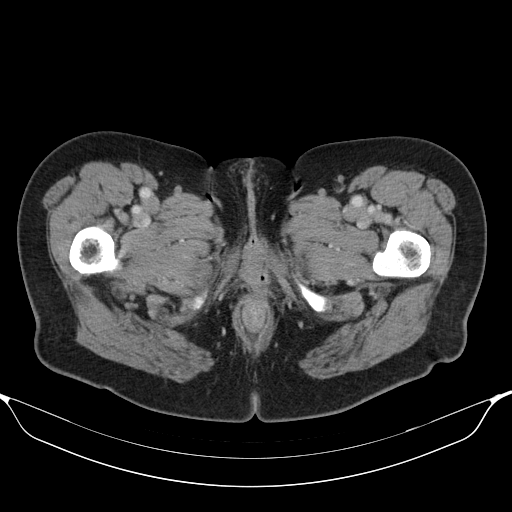
[im 24/107  soft-tissue]
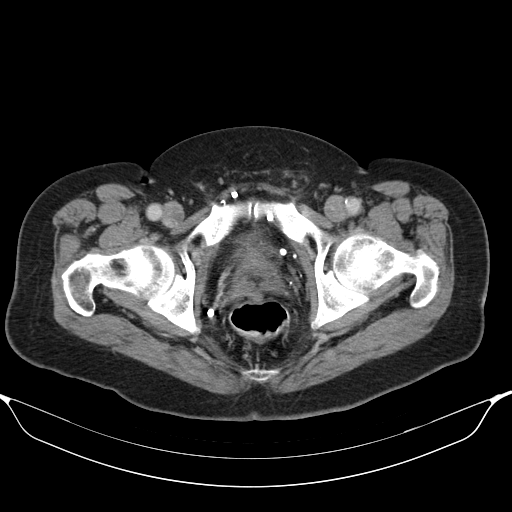
[im 30/107  soft-tissue]
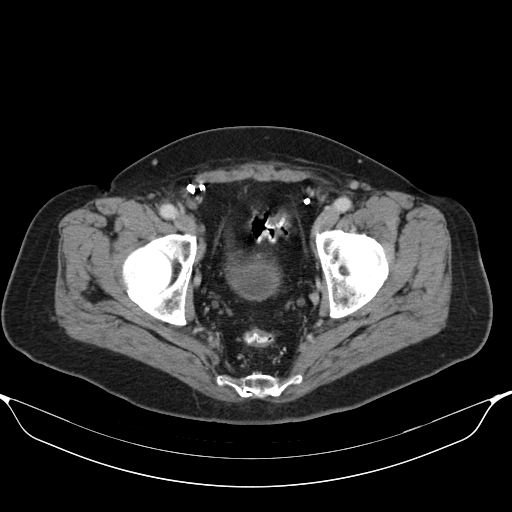
[im 36/107  soft-tissue]
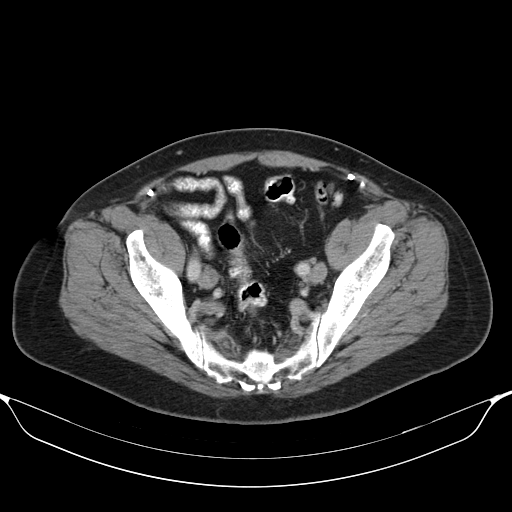
[im 48/107  soft-tissue]
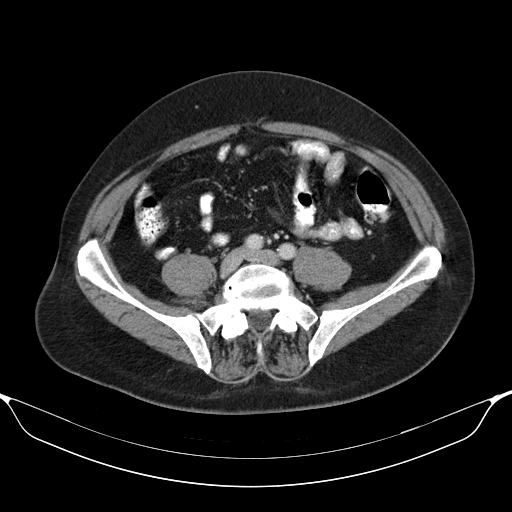
[im 54/107  soft-tissue]
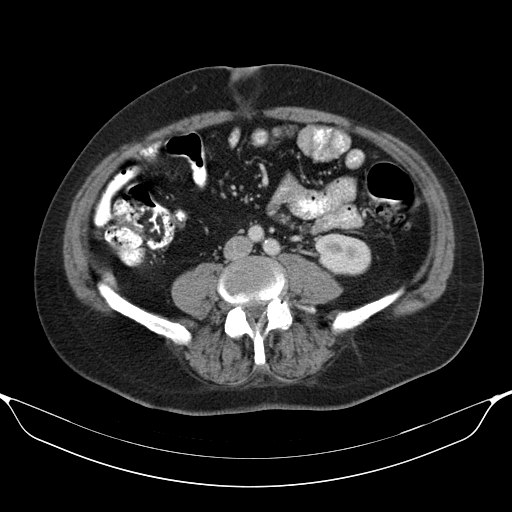
[im 59/107  soft-tissue]
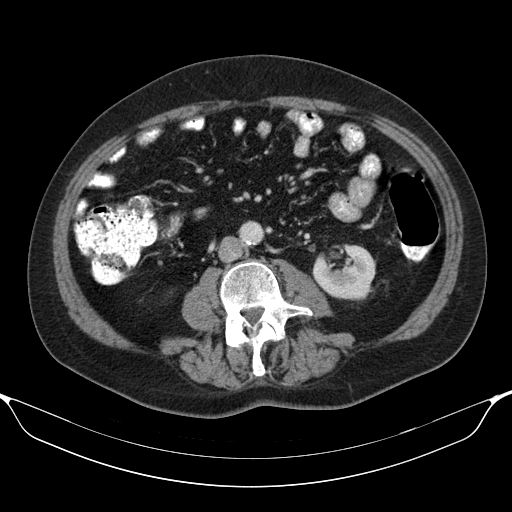
[im 71/107  soft-tissue]
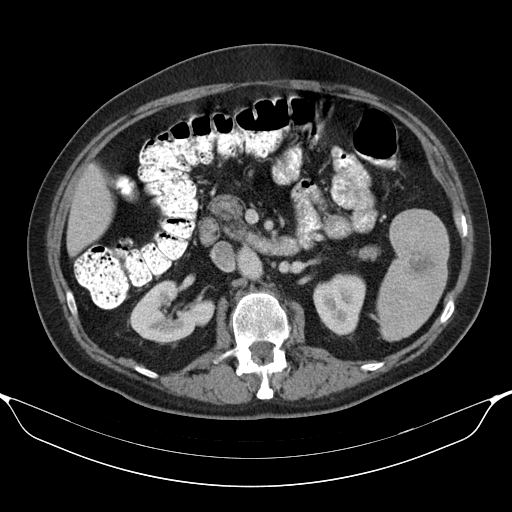
[im 71/107  bone]
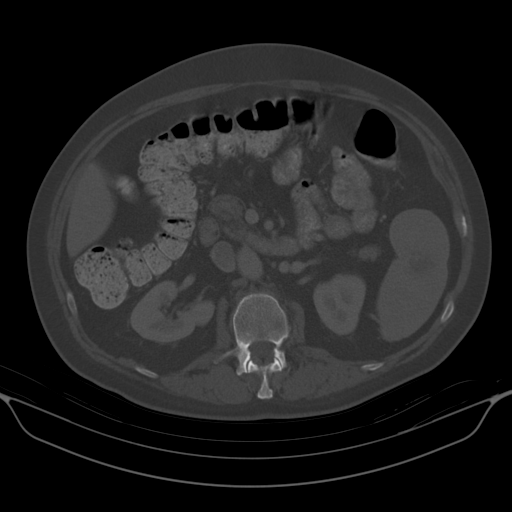
[im 77/107  soft-tissue]
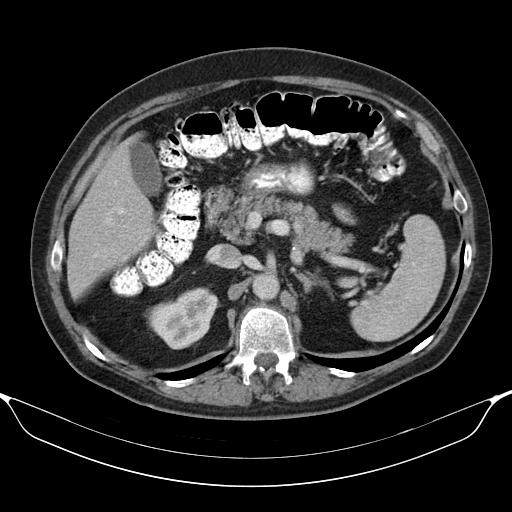
[im 83/107  soft-tissue]
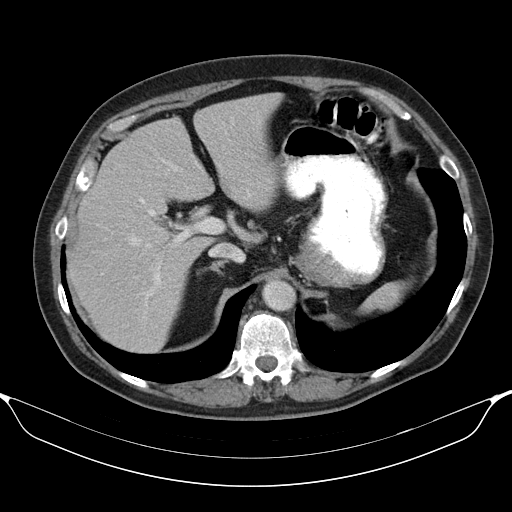
[im 95/107  soft-tissue]
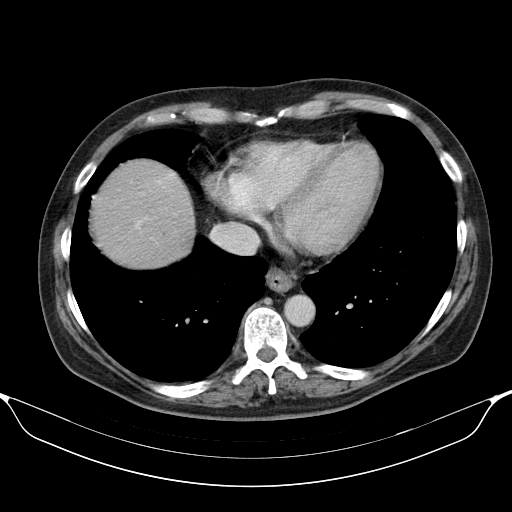
[im 101/107  soft-tissue]
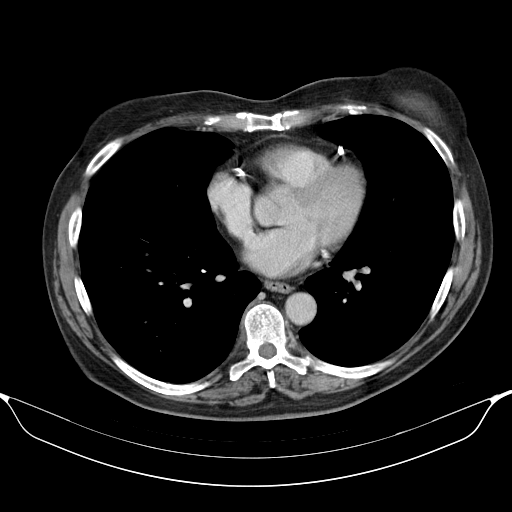

[Series 602: coronal · coronal · 1.03mm/px · 3 of 137 slices shown]
[im 46/137  soft-tissue]
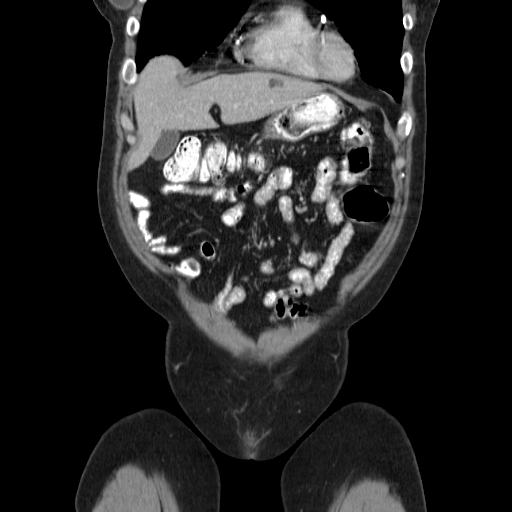
[im 61/137  soft-tissue]
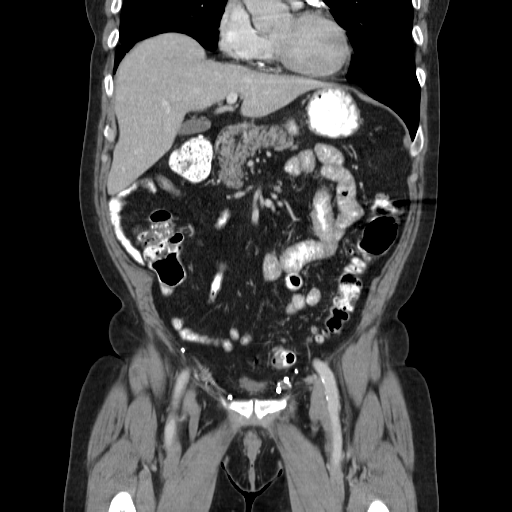
[im 76/137  soft-tissue]
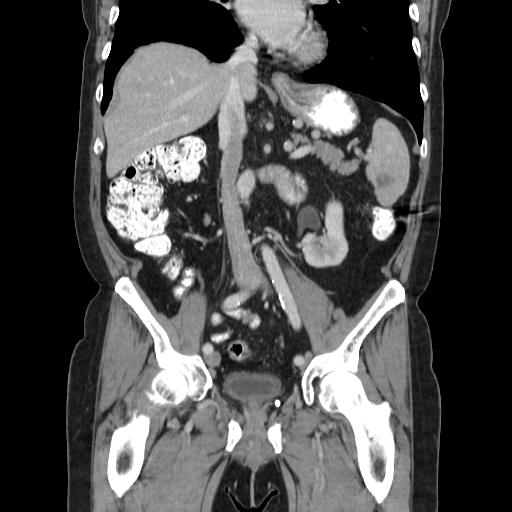

[16 of 46 positions shown; findings below may reference images not displayed]

FINDINGS: Lower Chest: No acute findings.

Hepatobiliary: No hepatic masses identified. Stable small left
hepatic lobe cysts. Gallbladder is unremarkable.

Pancreas:  No mass or inflammatory changes.

Spleen: Within normal limits in size. Stable 2.7 cm lesion in the
inferior spleen shows peripheral nodular enhancement, consistent
with benign hemangioma.

Adrenals/Urinary Tract: No masses identified. No evidence of
hydronephrosis. Mild diffuse bladder wall thickening, which may be
due to cystitis. Tiny amount of gas seen within the urinary bladder,
likely from recent catheterization.

Stomach/Bowel: Small hiatal hernia. No evidence of obstruction,
inflammatory process or abnormal fluid collections. Diverticulosis
involving descending and proximal sigmoid colon, however there is no
evidence of diverticulitis.

Vascular/Lymphatic: No pathologically enlarged lymph nodes. No
abdominal aortic aneurysm. Aortic atherosclerosis.

Reproductive:  No mass or other significant abnormality.

Other: Surgical mesh in both inguinal regions. No evidence of
recurrent inguinal hernia. Stable small paraumbilical hernia
containing only fat.

Musculoskeletal:  No suspicious bone lesions identified.
IMPRESSION: Colonic diverticulosis, without radiographic evidence of
diverticulitis.

Mild diffuse wall thickening of urinary bladder, suspicious for
cystitis. Recommend correlation with urinalysis. Tiny amount of gas
within the urinary bladder, likely due to recent catheterization ;
clinical correlation recommended.

Stable small hiatal hernia and fat-containing paraumbilical hernia.

## 2017-12-22 DIAGNOSIS — Z85828 Personal history of other malignant neoplasm of skin: Secondary | ICD-10-CM | POA: Diagnosis not present

## 2017-12-22 DIAGNOSIS — L578 Other skin changes due to chronic exposure to nonionizing radiation: Secondary | ICD-10-CM | POA: Diagnosis not present

## 2017-12-22 DIAGNOSIS — L309 Dermatitis, unspecified: Secondary | ICD-10-CM | POA: Diagnosis not present

## 2017-12-22 DIAGNOSIS — D1800 Hemangioma unspecified site: Secondary | ICD-10-CM | POA: Diagnosis not present

## 2017-12-22 DIAGNOSIS — Z85820 Personal history of malignant melanoma of skin: Secondary | ICD-10-CM | POA: Diagnosis not present

## 2017-12-22 DIAGNOSIS — L509 Urticaria, unspecified: Secondary | ICD-10-CM | POA: Diagnosis not present

## 2017-12-22 DIAGNOSIS — D485 Neoplasm of uncertain behavior of skin: Secondary | ICD-10-CM | POA: Diagnosis not present

## 2017-12-22 DIAGNOSIS — Z1283 Encounter for screening for malignant neoplasm of skin: Secondary | ICD-10-CM | POA: Diagnosis not present

## 2017-12-22 DIAGNOSIS — D225 Melanocytic nevi of trunk: Secondary | ICD-10-CM | POA: Diagnosis not present

## 2017-12-23 ENCOUNTER — Other Ambulatory Visit: Payer: Self-pay

## 2017-12-23 DIAGNOSIS — Z1239 Encounter for other screening for malignant neoplasm of breast: Secondary | ICD-10-CM

## 2017-12-30 ENCOUNTER — Ambulatory Visit
Admission: RE | Admit: 2017-12-30 | Discharge: 2017-12-30 | Disposition: A | Discharge status: Home / Self Care | Payer: 59 | Source: Ambulatory Visit | Attending: Family Medicine | Admitting: Family Medicine

## 2017-12-30 DIAGNOSIS — Z1231 Encounter for screening mammogram for malignant neoplasm of breast: Secondary | ICD-10-CM | POA: Insufficient documentation

## 2017-12-30 DIAGNOSIS — Z1239 Encounter for other screening for malignant neoplasm of breast: Secondary | ICD-10-CM

## 2017-12-31 LAB — BASIC METABOLIC PANEL
BUN: 13 (ref 4–21)
Creatinine: 0.8
GLUCOSE: 91
POTASSIUM: 4.1 (ref 3.4–5.3)
SODIUM: 139 (ref 137–147)

## 2017-12-31 LAB — PSA: PSA: <0.1

## 2017-12-31 LAB — HEPATIC FUNCTION PANEL
ALT: 14
AST: 16
Alkaline Phosphatase: 59 (ref 25–125)
Bilirubin, Total: 0.3

## 2017-12-31 LAB — CBC AND DIFFERENTIAL
HEMATOCRIT: 40 (ref 39.0–52.0)
HEMOGLOBIN: 13.1 (ref 13.0–17.0)
NEUTROS ABS: 60
Platelets: 235 (ref 150–399)
WBC: 6.4

## 2017-12-31 LAB — LIPID PANEL
CHOLESTEROL: 129 (ref 0–200)
HDL: 36 (ref 35–70)
LDL CALC: 71
Triglycerides: 109 (ref 40–160)

## 2017-12-31 LAB — TSH: TSH: 2.67 (ref <=5.90)

## 2018-01-03 ENCOUNTER — Other Ambulatory Visit: Payer: Self-pay

## 2018-01-04 ENCOUNTER — Ambulatory Visit: Payer: 59 | Admitting: Internal Medicine

## 2018-01-04 ENCOUNTER — Inpatient Hospital Stay: Payer: 59

## 2018-01-04 ENCOUNTER — Other Ambulatory Visit: Payer: 59

## 2018-01-11 ENCOUNTER — Inpatient Hospital Stay: Payer: 59

## 2018-01-11 ENCOUNTER — Encounter: Payer: Self-pay | Admitting: Internal Medicine

## 2018-01-11 ENCOUNTER — Inpatient Hospital Stay: Payer: 59 | Attending: Internal Medicine | Admitting: Internal Medicine

## 2018-01-11 VITALS — BP 134/91 | HR 70 | Temp 97.4°F | Resp 16 | Wt 199.3 lb

## 2018-01-11 DIAGNOSIS — L309 Dermatitis, unspecified: Secondary | ICD-10-CM | POA: Insufficient documentation

## 2018-01-11 DIAGNOSIS — I2510 Atherosclerotic heart disease of native coronary artery without angina pectoris: Secondary | ICD-10-CM | POA: Insufficient documentation

## 2018-01-11 DIAGNOSIS — I10 Essential (primary) hypertension: Secondary | ICD-10-CM | POA: Insufficient documentation

## 2018-01-11 DIAGNOSIS — C8334 Diffuse large B-cell lymphoma, lymph nodes of axilla and upper limb: Secondary | ICD-10-CM

## 2018-01-11 DIAGNOSIS — Z8572 Personal history of non-Hodgkin lymphomas: Secondary | ICD-10-CM | POA: Diagnosis not present

## 2018-01-11 DIAGNOSIS — Z9221 Personal history of antineoplastic chemotherapy: Secondary | ICD-10-CM | POA: Insufficient documentation

## 2018-01-11 LAB — LACTATE DEHYDROGENASE: LDH: 126 U/L (ref 98–192)

## 2018-01-11 MED ORDER — SODIUM CHLORIDE 0.9% FLUSH
10.0000 mL | INTRAVENOUS | Status: DC | PRN
  Administered 2018-01-11: 10 mL via INTRAVENOUS
  Filled 2018-01-11: qty 10

## 2018-01-11 MED ORDER — HEPARIN SOD (PORK) LOCK FLUSH 100 UNIT/ML IV SOLN
500.0000 [IU] | Freq: Once | INTRAVENOUS | Status: AC
  Administered 2018-01-11: 500 [IU] via INTRAVENOUS
  Filled 2018-01-11: qty 5

## 2018-01-11 NOTE — Progress Notes (Signed)
[place] OFFICE PROGRESS NOTE  Patient Care Team: Glean Hess, MD as PCP - General (Family Medicine)  Cancer Staging Diffuse large B-cell lymphoma of lymph nodes of axilla [facility]) Staging form: Lymphoid Neoplasms, AJCC 6th Edition - Clinical: No stage assigned - Unsigned    Oncology History   # SEP 2015- .lymph node biopsy from the right axillary lymph node is positive for diffuse B. large cell lymphoma [activated B cell lymphoma];  BCL 6 rearrangement positive; NEG- mcy & bcl-2 gene rerranagement; MYC negative with activated B cells.  LDH is 333 done at lab-crp ; Stage IV [spleen POSITIVE]; bone marrow biopsy is negative for any involvement with lymphoma; She started chemotherapy with R. CHOP in September of 2015.Marland Kitchen PATIENT HAS FINISHED TOTAL 6 CYCLES OF CHEMOTHERAPY WITH r chop (jANUARY, 2016); PET scan shows complete response; Last PET FEB 2017  # Patient had a right medial breast lesion 1.5 cm tumorjunctional and lentiginous dysplastic nevus with mild-to-moderate atypia  #  Skin from left forearm hypertrophic actinic keratosis.  And superficial basal cell carcinoma.  From multiple area      Diffuse large B-cell lymphoma of lymph nodes of axilla (HCC)    INTERVAL HISTORY:  Kendra Salas 67 y.o.  adult pleasant patient above history of Diffuse large B-cell lymphoma ABC subtype [stage IV; involvement of spleen] status post R CHOP finished in summer 2016-is here for follow-up.  In the interim patient has noted to have a flareup of of her eczema.  She is contemplating the use of IL-1 antagonist/as recommended by her dermatologist.  Patient denies any new lumps or bumps. No weight loss. No night sweats.  REVIEW OF SYSTEMS:  A complete 10 point review of system is done which is negative except mentioned above/history of present illness.   PAST MEDICAL HISTORY :  Past Medical History:  Diagnosis Date  . Anxiety   . CAD (coronary artery disease)   . Clotting  disorder ([place])   . GERD (gastroesophageal reflux disease)   . Hyperlipemia   . Hypertension   . Lymphoma ([place])   . Melanoma ([place])   . Personal history of chemotherapy     PAST SURGICAL HISTORY :   Past Surgical History:  Procedure Laterality Date  . AUGMENTATION MAMMAPLASTY Bilateral 2003  . CARDIAC CATHETERIZATION     X2 - has 5 stents. 4 LAD, 1 posterior, Last 3 placed 2004  . COLONOSCOPY    . ESOPHAGOGASTRODUODENOSCOPY (EGD) WITH PROPOFOL N/A 11/22/2015   Procedure: ESOPHAGOGASTRODUODENOSCOPY (EGD) WITH PROPOFOL;  Surgeon: Lucilla Lame, MD;  Location: [place];  Service: Endoscopy;  Laterality: N/A;  . FACIAL COSMETIC SURGERY    . gender re-affirmation    . HERNIA REPAIR     x2    FAMILY HISTORY :   Family History  Problem Relation Age of Onset  . Breast cancer Maternal Aunt 72    SOCIAL HISTORY:   Social History   Tobacco Use  . Smoking status: Never Smoker  Substance Use Topics  . Alcohol use: Yes    Alcohol/week: 0.6 oz    Types: 1 Shots of liquor per week  . Drug use: Not on file    ALLERGIES:  has No Known Allergies.  MEDICATIONS:  Current Outpatient Medications  Medication Sig Dispense Refill  . aspirin 81 MG tablet Take 81 mg by mouth daily.    . clopidogrel (PLAVIX) 75 MG tablet Take 1 tablet (75 mg total) by mouth daily. 90 tablet 3  .  Crisaborole (EUCRISA) 2 % OINT Apply 1 application topically 2 (two) times daily. 60 g 1  . dexlansoprazole (DEXILANT) 60 MG capsule Take 60 mg by mouth daily.    Marland Kitchen estradiol (ESTRACE) 2 MG tablet Take 1 tablet (2 mg total) by mouth daily. 90 tablet 3  . finasteride (PROSCAR) 5 MG tablet Take 1 tablet (5 mg total) by mouth daily. 90 tablet 1  . hydrOXYzine (ATARAX/VISTARIL) 10 MG tablet Take 1 tablet (10 mg total) by mouth every 6 (six) hours as needed for itching. 90 tablet 3  . loratadine (CLARITIN) 10 MG tablet Take 1 tablet (10 mg total) by mouth daily. (Patient taking differently: Take 10 mg by mouth daily  as needed. ) 30 tablet 11  . losartan (COZAAR) 50 MG tablet Take 1 tablet (50 mg total) by mouth daily. 90 tablet 1  . metoprolol succinate (TOPROL-XL) 25 MG 24 hr tablet Take 1 tablet (25 mg total) by mouth daily. 90 tablet 1  . mupirocin ointment (BACTROBAN) 2 % Place 1 application into the nose 2 (two) times daily. 30 g 0  . rosuvastatin (CRESTOR) 20 MG tablet TAKE 1 TABLET BY MOUTH DAILY 90 tablet 1  . triamcinolone cream (KENALOG) 0.1 % Apply 1 application topically 2 (two) times daily. 30 g 0  . doxepin (SINEQUAN) 10 MG capsule Take 1 capsule (10 mg total) by mouth at bedtime. (Patient not taking: Reported on 01/11/2018) 90 capsule 3  . nebivolol (BYSTOLIC) 2.5 MG tablet Take 2.5 mg by mouth daily.    . ondansetron (ZOFRAN) 4 MG tablet Take 1 tablet (4 mg total) by mouth every 8 (eight) hours as needed for nausea or vomiting. 12 tablet 0   No current facility-administered medications for this visit.    Facility-Administered Medications Ordered in Other Visits  Medication Dose Route Frequency Provider Last Rate Last Dose  . heparin lock flush 100 unit/mL  500 Units Intravenous Once Choksi, Janak, MD      . sodium chloride 0.9 % injection 10 mL  10 mL Intravenous PRN Choksi, Janak, MD      . sodium chloride flush (NS) 0.9 % injection 10 mL  10 mL Intravenous PRN Cammie Sickle, MD   10 mL at 01/11/18 1130    PHYSICAL EXAMINATION: ECOG PERFORMANCE STATUS: 0 - Asymptomatic  BP (!) 134/91 (BP Location: Left Arm, Patient Position: Sitting)   Pulse 70   Temp (!) 97.4 F (36.3 C) (Tympanic)   Resp 16   Wt 199 lb 4.7 oz (90.4 kg)   BMI 28.60 kg/m   Filed Weights   01/11/18 1142  Weight: 199 lb 4.7 oz (90.4 kg)    GENERAL: Well-nourished well-developed; Alert, no distress and comfortable. Alone. EYES: no pallor or icterus OROPHARYNX: no thrush or ulceration; good dentition  NECK: supple, no masses felt LYMPH:  no palpable lymphadenopathy in the cervical, axillary or inguinal  regions LUNGS: clear to auscultation and  No wheeze or crackles HEART/CVS: regular rate & rhythm and no murmurs; No lower extremity edema ABDOMEN:abdomen soft, Left lower quadrant tenderness and normal bowel sounds Musculoskeletal:no cyanosis of digits and no clubbing  PSYCH: alert & oriented x 3 with fluent speech NEURO: no focal motor/sensory deficits SKIN:  Macular papular rash noted [chronic]  LABORATORY DATA:  I have reviewed the data as listed    Component Value Date/Time   NA 139 12/31/2017   NA 139 10/16/2014 0845   K 4.1 12/31/2017   K 3.7 10/16/2014 0845  CL 104 07/06/2017 1409   CL 107 10/16/2014 0845   CO2 24 07/06/2017 1409   CO2 25 10/16/2014 0845   GLUCOSE 90 07/27/2017 1051   GLUCOSE 121 (H) 07/06/2017 1409   GLUCOSE 80 10/16/2014 0845   BUN 13 12/31/2017   BUN 11 10/16/2014 0845   CREATININE 0.8 12/31/2017   CREATININE 0.93 07/06/2017 1409   CREATININE 0.85 10/16/2014 0845   CALCIUM 8.5 (L) 07/06/2017 1409   CALCIUM 8.1 (L) 10/16/2014 0845   PROT 7.6 07/06/2017 1409   PROT 6.4 10/16/2014 0845   ALBUMIN 4.4 07/06/2017 1409   ALBUMIN 3.1 (L) 10/16/2014 0845   AST 16 12/31/2017   AST 23 10/16/2014 0845   ALT 14 12/31/2017   ALT 18 10/16/2014 0845   ALKPHOS 59 12/31/2017   ALKPHOS 47 10/16/2014 0845   BILITOT 0.4 07/06/2017 1409   BILITOT 0.3 10/16/2014 0845   GFRNONAA >60 07/06/2017 1409   GFRNONAA >60 10/16/2014 0845   GFRAA >60 07/06/2017 1409   GFRAA >60 10/16/2014 0845    No results found for: SPEP, UPEP  Lab Results  Component Value Date   WBC 6.4 12/31/2017   NEUTROABS 60 12/31/2017   HGB 13.1 12/31/2017   HCT 40 12/31/2017   MCV 82.0 07/06/2017   PLT 235 12/31/2017      Chemistry      Component Value Date/Time   NA 139 12/31/2017   NA 139 10/16/2014 0845   K 4.1 12/31/2017   K 3.7 10/16/2014 0845   CL 104 07/06/2017 1409   CL 107 10/16/2014 0845   CO2 24 07/06/2017 1409   CO2 25 10/16/2014 0845   BUN 13 12/31/2017   BUN 11  10/16/2014 0845   CREATININE 0.8 12/31/2017   CREATININE 0.93 07/06/2017 1409   CREATININE 0.85 10/16/2014 0845   GLU 91 12/31/2017      Component Value Date/Time   CALCIUM 8.5 (L) 07/06/2017 1409   CALCIUM 8.1 (L) 10/16/2014 0845   ALKPHOS 59 12/31/2017   ALKPHOS 47 10/16/2014 0845   AST 16 12/31/2017   AST 23 10/16/2014 0845   ALT 14 12/31/2017   ALT 18 10/16/2014 0845   BILITOT 0.4 07/06/2017 1409   BILITOT 0.3 10/16/2014 0845       RADIOGRAPHIC STUDIES: I have personally reviewed the radiological images as listed and agreed with the findings in the report. No results found.   ASSESSMENT & PLAN:  Diffuse large B-cell lymphoma of lymph nodes of axilla (HCC) # DLBCL-ABC subtype status post 6 cycles of R CHOP; complete remission [finished chemotherapy summer of 2016]. Last PET scan in February 2017 NED.  #Clinically no evidence of recurrence.  Would recommend scans only on clinical basis.  # Eczema flare-Re: use of IL-1 antagonist.  Review of literature did not show any significant concerns for lymphomatous risk.  Given the risk versus benefit-I think the benefits outweigh the risk.  Patient will call Dr.Kowalski.   # CAD status post stenting. Stable.  # POrt flush. Every 2 months. Will keep for 1 year; and then if no clinical evidence of disease with recommend explantation.  #Follow-up in 1 year; CBC CMP LDH; port flush every 2 months  # Dr.Kowalski; [facility]    Orders Placed This Encounter  Procedures  . CBC with Differential/Platelet    Standing Status:   Future    Standing Expiration Date:   01/12/2019  . Comprehensive metabolic panel    Standing Status:   Future    Standing  Expiration Date:   01/12/2019  . Lactate dehydrogenase    Standing Status:   Future    Standing Expiration Date:   01/12/2019   All questions were answered. The patient knows to call the clinic with any problems, questions or concerns.      Cammie Sickle, MD 01/11/2018 1:04 PM

## 2018-01-11 NOTE — Assessment & Plan Note (Addendum)
#   DLBCL-ABC subtype status post 6 cycles of R CHOP; complete remission [finished chemotherapy summer of 2016]. Last PET scan in February 2017 NED.  #Clinically no evidence of recurrence.  Would recommend scans only on clinical basis.  # Eczema flare-Re: use of IL-1 antagonist.  Review of literature did not show any significant concerns for lymphomatous risk.  Given the risk versus benefit-I think the benefits outweigh the risk.  Patient will call Dr.Kowalski.   # CAD status post stenting. Stable.  # POrt flush. Every 2 months. Will keep for 1 year; and then if no clinical evidence of disease with recommend explantation.  #Follow-up in 1 year; CBC CMP LDH; port flush every 2 months  # Dr.Kowalski; Shanon Brow

## 2018-01-26 ENCOUNTER — Other Ambulatory Visit: Payer: Self-pay

## 2018-01-26 DIAGNOSIS — L308 Other specified dermatitis: Secondary | ICD-10-CM | POA: Diagnosis not present

## 2018-01-26 DIAGNOSIS — L2081 Atopic neurodermatitis: Secondary | ICD-10-CM | POA: Diagnosis not present

## 2018-01-26 DIAGNOSIS — Z79890 Hormone replacement therapy (postmenopausal): Secondary | ICD-10-CM

## 2018-01-26 MED ORDER — FINASTERIDE 5 MG PO TABS: 5.0000 mg | ORAL_TABLET | Freq: Every day | ORAL | 1 refills | Status: DC

## 2018-01-28 ENCOUNTER — Other Ambulatory Visit: Payer: Self-pay

## 2018-01-28 DIAGNOSIS — Z79890 Hormone replacement therapy (postmenopausal): Secondary | ICD-10-CM

## 2018-01-28 DIAGNOSIS — I10 Essential (primary) hypertension: Secondary | ICD-10-CM

## 2018-01-28 MED ORDER — LOSARTAN POTASSIUM 50 MG PO TABS: 50.0000 mg | ORAL_TABLET | Freq: Every day | ORAL | 0 refills | Status: DC

## 2018-01-28 MED ORDER — METOPROLOL SUCCINATE ER 25 MG PO TB24: 25.0000 mg | ORAL_TABLET | Freq: Every day | ORAL | 0 refills | Status: DC

## 2018-01-28 MED ORDER — ESTRADIOL 2 MG PO TABS: 2.0000 mg | ORAL_TABLET | Freq: Every day | ORAL | 0 refills | Status: DC

## 2018-02-02 MED FILL — DUPIXENT 300 MG/2 ML SAFE S: 300 | 1 days supply | Qty: 4 | Fill #0

## 2018-02-03 ENCOUNTER — Telehealth: Payer: Self-pay | Admitting: Pharmacist

## 2018-02-03 NOTE — Telephone Encounter (Signed)
Called patient to schedule an appointment for the [facility]. I was unable to reach the patient so I left a HIPAA-compliant message requesting that the patient return my call. Secure email also sent.

## 2018-02-09 ENCOUNTER — Ambulatory Visit (INDEPENDENT_AMBULATORY_CARE_PROVIDER_SITE_OTHER): Payer: 59 | Admitting: Pharmacist

## 2018-02-09 DIAGNOSIS — Z79899 Other long term (current) drug therapy: Secondary | ICD-10-CM

## 2018-02-09 MED ORDER — DUPILUMAB 300 MG/2ML ~~LOC~~ SOSY: 1.0000 | PREFILLED_SYRINGE | SUBCUTANEOUS | 5 refills | Status: DC

## 2018-02-09 NOTE — Progress Notes (Signed)
   S: Patient presents to [facility] [place] for review of their specialty medication therapy.  Patient is currently taking Dupixent for eczema. Patient is managed by Dr. Nehemiah Massed for this.   Adherence: she has had the loading dose and the first maintenance dose.  Efficacy: has noticed that her right arm has started to dry up but has not noticed any changes in her left arm. She is excited to see if it starts to really work over the next few weeks.   Dosing: 300 mg every 14 days  Dose adjustments: Renal: no dose adjustments (has not been studied) Hepatic: no dose adjustments (has not been studied)  Screening: TB test: completed per patient  Monitoring: S/sx of infection: denies S/sx of hypersensitivity: denies S/sx of ocular effects: denies S/sx of eosinophilia/vasculitis: denies   She reports that oncology cleared her being on this medication and she feels comfortable taking it.  O:     Lab Results  Component Value Date   WBC 6.4 12/31/2017   HGB 13.1 12/31/2017   HCT 40 12/31/2017   MCV 82.0 07/06/2017   PLT 235 12/31/2017      Chemistry      Component Value Date/Time   NA 139 12/31/2017   NA 139 10/16/2014 0845   K 4.1 12/31/2017   K 3.7 10/16/2014 0845   CL 104 07/06/2017 1409   CL 107 10/16/2014 0845   CO2 24 07/06/2017 1409   CO2 25 10/16/2014 0845   BUN 13 12/31/2017   BUN 11 10/16/2014 0845   CREATININE 0.8 12/31/2017   CREATININE 0.93 07/06/2017 1409   CREATININE 0.85 10/16/2014 0845   GLU 91 12/31/2017      Component Value Date/Time   CALCIUM 8.5 (L) 07/06/2017 1409   CALCIUM 8.1 (L) 10/16/2014 0845   ALKPHOS 59 12/31/2017   ALKPHOS 47 10/16/2014 0845   AST 16 12/31/2017   AST 23 10/16/2014 0845   ALT 14 12/31/2017   ALT 18 10/16/2014 0845   BILITOT 0.4 07/06/2017 1409   BILITOT 0.3 10/16/2014 0845       A/P: 1. Medication review: Patient currently on [place] for eczema. Reviewed the medication with the patient, including the  following: Dupixent is a monoclonal antibody used for the treatment of asthma or atopic dermatitis. Patient educated on purpose, proper use and potential adverse effects of Dupixent. Possible adverse effects include increased risk of infection, ocular effects, vasculitis/eosinophilia, and hypersensitivity reactions. No recommendations for any changes.   Christella Hartigan, PharmD, BCPS, BCACP, CPP Clinical Pharmacist Practitioner  [phone]

## 2018-02-23 DIAGNOSIS — L2081 Atopic neurodermatitis: Secondary | ICD-10-CM | POA: Diagnosis not present

## 2018-03-09 ENCOUNTER — Inpatient Hospital Stay: Payer: 59 | Attending: Internal Medicine

## 2018-03-09 MED FILL — DUPIXENT 300 MG/2 ML SAFE S: 300 | 28 days supply | Qty: 4 | Fill #0

## 2018-03-16 ENCOUNTER — Inpatient Hospital Stay: Payer: 59 | Attending: Internal Medicine

## 2018-03-16 VITALS — BP 139/89 | HR 54 | Resp 20

## 2018-03-16 DIAGNOSIS — Z452 Encounter for adjustment and management of vascular access device: Secondary | ICD-10-CM | POA: Diagnosis not present

## 2018-03-16 DIAGNOSIS — Z8572 Personal history of non-Hodgkin lymphomas: Secondary | ICD-10-CM | POA: Insufficient documentation

## 2018-03-16 DIAGNOSIS — Z95828 Presence of other vascular implants and grafts: Secondary | ICD-10-CM

## 2018-03-16 MED ORDER — HEPARIN SOD (PORK) LOCK FLUSH 100 UNIT/ML IV SOLN
500.0000 [IU] | Freq: Once | INTRAVENOUS | Status: AC
  Administered 2018-03-16: 500 [IU] via INTRAVENOUS

## 2018-03-16 MED ORDER — SODIUM CHLORIDE 0.9% FLUSH
10.0000 mL | INTRAVENOUS | Status: DC | PRN
  Administered 2018-03-16: 10 mL via INTRAVENOUS
  Filled 2018-03-16: qty 10

## 2018-03-30 ENCOUNTER — Ambulatory Visit (INDEPENDENT_AMBULATORY_CARE_PROVIDER_SITE_OTHER): Payer: 59 | Admitting: Internal Medicine

## 2018-03-30 ENCOUNTER — Encounter: Payer: Self-pay | Admitting: Internal Medicine

## 2018-03-30 VITALS — BP 136/96 | HR 65 | Temp 98.1°F | Resp 16 | Ht 71.0 in | Wt 195.0 lb

## 2018-03-30 DIAGNOSIS — Z1239 Encounter for other screening for malignant neoplasm of breast: Secondary | ICD-10-CM

## 2018-03-30 DIAGNOSIS — Z23 Encounter for immunization: Secondary | ICD-10-CM

## 2018-03-30 DIAGNOSIS — K449 Diaphragmatic hernia without obstruction or gangrene: Secondary | ICD-10-CM | POA: Diagnosis not present

## 2018-03-30 DIAGNOSIS — L409 Psoriasis, unspecified: Secondary | ICD-10-CM

## 2018-03-30 DIAGNOSIS — C8334 Diffuse large B-cell lymphoma, lymph nodes of axilla and upper limb: Secondary | ICD-10-CM

## 2018-03-30 DIAGNOSIS — I10 Essential (primary) hypertension: Secondary | ICD-10-CM | POA: Diagnosis not present

## 2018-03-30 DIAGNOSIS — E782 Mixed hyperlipidemia: Secondary | ICD-10-CM

## 2018-03-30 DIAGNOSIS — K219 Gastro-esophageal reflux disease without esophagitis: Secondary | ICD-10-CM | POA: Diagnosis not present

## 2018-03-30 DIAGNOSIS — Z0000 Encounter for general adult medical examination without abnormal findings: Secondary | ICD-10-CM

## 2018-03-30 DIAGNOSIS — Z0001 Encounter for general adult medical examination with abnormal findings: Secondary | ICD-10-CM | POA: Diagnosis not present

## 2018-03-30 LAB — POCT URINALYSIS DIPSTICK
BILIRUBIN UA: NEGATIVE
Blood, UA: NEGATIVE
GLUCOSE UA: NEGATIVE
Ketones, UA: NEGATIVE
Leukocytes, UA: NEGATIVE
Nitrite, UA: NEGATIVE
Protein, UA: NEGATIVE
Spec Grav, UA: 1.01 (ref 1.010–1.025)
Urobilinogen, UA: 0.2 E.U./dL
pH, UA: 5 (ref 5.0–8.0)

## 2018-03-30 MED ORDER — LOSARTAN POTASSIUM 100 MG PO TABS: 100.0000 mg | ORAL_TABLET | Freq: Every day | ORAL | 3 refills | Status: DC

## 2018-03-30 MED ORDER — RANITIDINE HCL 150 MG PO TABS: 150.0000 mg | ORAL_TABLET | Freq: Two times a day (BID) | ORAL | 1 refills | Status: DC

## 2018-03-30 NOTE — Patient Instructions (Signed)

## 2018-03-30 NOTE — Progress Notes (Signed)
Date:  03/30/2018   Name:  NAMITA YEARWOOD   DOB:  1951/06/17   MRN:  [id number]   Chief Complaint: Annual Exam Lonnetta MIKHAILA ROH is a 67 y.o. adult who presents today for her Complete Annual Exam. She feels fairly well. She reports exercising none. She reports she is sleeping fairly well. Recent mammogram was normal.  Last colonoscopy was done at age 20 but no report in the chart.  Has had Prevnar-13 but not PPV-23.  Hypertension  This is a chronic problem. The problem has been waxing and waning (seems to be generally higher over the past year) since onset. Associated symptoms include palpitations. Pertinent negatives include no chest pain, headaches, malaise/fatigue, peripheral edema or shortness of breath. Past treatments include angiotensin blockers. The current treatment provides moderate improvement. There are no compliance problems.   Gastroesophageal Reflux  She complains of belching and heartburn. She reports no abdominal pain, no chest pain, no choking or no wheezing. This is a recurrent problem. The problem occurs frequently. The heartburn is located in the substernum. The heartburn is of mild intensity. Pertinent negatives include no fatigue. She has tried a PPI for the symptoms. The treatment provided moderate relief.   Lymphoma in remission - currently under care of Oncology.  No new adenopathy, weight loss, worsening fatigue.   Review of Systems  Constitutional: Negative for appetite change, chills, diaphoresis, fatigue, malaise/fatigue and unexpected weight change.  HENT: Negative for hearing loss, tinnitus, trouble swallowing and voice change.   Eyes: Negative for visual disturbance.  Respiratory: Negative for choking, shortness of breath and wheezing.   Cardiovascular: Positive for palpitations. Negative for chest pain and leg swelling.  Gastrointestinal: Positive for abdominal distention (and gerd) and heartburn. Negative for abdominal pain, blood in stool, constipation and  diarrhea.  Genitourinary: Negative for dysuria and frequency.  Musculoskeletal: Negative for arthralgias, back pain and myalgias.  Skin: Positive for rash. Negative for color change.  Neurological: Negative for dizziness, syncope and headaches.  Hematological: Negative for adenopathy.  Psychiatric/Behavioral: Negative for dysphoric mood and sleep disturbance.    Patient Active Problem List   Diagnosis Date Noted  . Mixed hyperlipidemia 03/31/2016  . Breathlessness on exertion 03/31/2016  . Special screening for malignant neoplasms, colon   . Benign neoplasm of ascending colon   . Arteriosclerosis of coronary artery 03/03/2015  . Diverticulitis 03/03/2015  . Benign essential HTN 03/03/2015  . Enlargement of spleen 03/03/2015  . Disease caused by fungus 03/03/2015  . 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 Authorizing Provider  aspirin 81 MG tablet Take 81 mg by mouth daily.   Yes [provider]  clopidogrel (PLAVIX) 75 MG tablet Take 1 tablet (75 mg total) by mouth daily. 02/09/17  Yes [name], MD  Crisaborole (EUCRISA) 2 % OINT Apply 1 application topically 2 (two) times daily. 07/19/17  Yes [name], MD  Dupilumab (DUPIXENT) 300 MG/2ML SOSY Inject 1 Syringe into the skin every 14 (fourteen) days. 02/09/18  Yes Tresa Garter, MD  estradiol (ESTRACE) 2 MG tablet Take 1 tablet (2 mg total) by mouth daily. 01/28/18  Yes [name], MD  finasteride (PROSCAR) 5 MG tablet Take 1 tablet (5 mg total) by mouth daily. 01/26/18  Yes [name], MD  hydrOXYzine (ATARAX/VISTARIL) 10 MG tablet Take 1 tablet (10 mg total) by mouth every 6 (  six) hours as needed for itching. 12/09/17  Yes Glean Hess, MD  loratadine (CLARITIN) 10 MG tablet Take 1 tablet (10 mg total) by mouth daily. Patient taking differently: Take 10 mg by mouth daily as needed.   06/11/15  Yes [name], MD  losartan (COZAAR) 50 MG tablet Take 1 tablet (50 mg total) by mouth daily. 01/28/18  Yes [name], MD  metoprolol succinate (TOPROL-XL) 25 MG 24 hr tablet Take 1 tablet (25 mg total) by mouth daily. 01/28/18  Yes [name], MD  ondansetron (ZOFRAN) 4 MG tablet Take 1 tablet (4 mg total) by mouth every 8 (eight) hours as needed for nausea or vomiting. 10/13/17  Yes [name], MD  rosuvastatin (CRESTOR) 20 MG tablet TAKE 1 TABLET BY MOUTH DAILY 12/10/17  Yes Glean Hess, MD  triamcinolone cream (KENALOG) 0.1 % Apply 1 application topically 2 (two) times daily. 11/10/17  Yes [name], MD  doxepin (SINEQUAN) 10 MG capsule Take 1 capsule (10 mg total) by mouth at bedtime. Patient not taking: Reported on 01/11/2018 12/09/17   Glean Hess, MD  mupirocin ointment (BACTROBAN) 2 % Place 1 application into the nose 2 (two) times daily. Patient not taking: Reported on 03/30/2018 10/13/16   Glean Hess, MD    No Known Allergies  Past Surgical History:  Procedure Laterality Date  . AUGMENTATION MAMMAPLASTY Bilateral 2003  . CARDIAC CATHETERIZATION     X2 - has 5 stents. 4 LAD, 1 posterior, Last 3 placed 2004  . COLONOSCOPY    . ESOPHAGOGASTRODUODENOSCOPY (EGD) WITH PROPOFOL N/A 11/22/2015   Procedure: ESOPHAGOGASTRODUODENOSCOPY (EGD) WITH PROPOFOL;  Surgeon: Lucilla Lame, MD;  Location: [facility];  Service: Endoscopy;  Laterality: N/A;  . FACIAL COSMETIC SURGERY    . gender re-affirmation    . HERNIA REPAIR     x2    Social History   Tobacco Use  . Smoking status: Never Smoker  . Smokeless tobacco: Never Used  Substance Use Topics  . Alcohol use: Yes    Alcohol/week: 0.6 oz    Types: 1 Shots of liquor per week  . Drug use: Never     Medication list has been reviewed and updated.  Current Meds  Medication Sig  . aspirin 81 MG tablet Take 81 mg by mouth daily.  . clopidogrel (PLAVIX) 75 MG tablet Take 1 tablet (75 mg  total) by mouth daily.  Stasia Cavalier (EUCRISA) 2 % OINT Apply 1 application topically 2 (two) times daily.  . Dupilumab (DUPIXENT) 300 MG/2ML SOSY Inject 1 Syringe into the skin every 14 (fourteen) days.  Marland Kitchen estradiol (ESTRACE) 2 MG tablet Take 1 tablet (2 mg total) by mouth daily.  . finasteride (PROSCAR) 5 MG tablet Take 1 tablet (5 mg total) by mouth daily.  . hydrOXYzine (ATARAX/VISTARIL) 10 MG tablet Take 1 tablet (10 mg total) by mouth every 6 (six) hours as needed for itching.  . loratadine (CLARITIN) 10 MG tablet Take 1 tablet (10 mg total) by mouth daily. (Patient taking differently: Take 10 mg by mouth daily as needed. )  . losartan (COZAAR) 100 MG tablet Take 1 tablet (100 mg total) by mouth daily.  . metoprolol succinate (TOPROL-XL) 25 MG 24 hr tablet Take 1 tablet (25 mg total) by mouth daily.  . ondansetron (ZOFRAN) 4 MG tablet Take 1 tablet (4 mg total) by mouth every 8 (eight) hours as needed for nausea or vomiting.  . rosuvastatin (CRESTOR) 20  MG tablet TAKE 1 TABLET BY MOUTH DAILY  . triamcinolone cream (KENALOG) 0.1 % Apply 1 application topically 2 (two) times daily.  . [DISCONTINUED] losartan (COZAAR) 50 MG tablet Take 1 tablet (50 mg total) by mouth daily.    PHQ 2/9 Scores 03/30/2018  PHQ - 2 Score 0  Exception Documentation Patient refusal    Physical Exam  Constitutional: She is oriented to person, place, and time. She appears well-developed and well-nourished. No distress.  HENT:  Head: Normocephalic and atraumatic.  Right Ear: Tympanic membrane and ear canal normal.  Left Ear: Tympanic membrane and ear canal normal.  Nose: Right sinus exhibits no maxillary sinus tenderness. Left sinus exhibits no maxillary sinus tenderness.  Mouth/Throat: Uvula is midline and oropharynx is clear and moist.  Eyes: Conjunctivae and EOM are normal. Right eye exhibits no discharge. Left eye exhibits no discharge. No scleral icterus.  Neck: Trachea normal and normal range of motion.  Carotid bruit is not present. No erythema present. No thyromegaly present.    Cardiovascular: Normal rate, regular rhythm, normal heart sounds and normal pulses.  Pulmonary/Chest: Effort normal. No respiratory distress. She has no wheezes. Right breast exhibits no mass, no nipple discharge, no skin change and no tenderness. Left breast exhibits no mass, no nipple discharge, no skin change and no tenderness.  Implants soft, symetrical  Abdominal: Soft. Bowel sounds are normal. There is no hepatosplenomegaly. There is no tenderness. There is no CVA tenderness.  Genitourinary: Rectum normal and prostate normal.  Genitourinary Comments: Slightly firm right lobe prostate  Musculoskeletal: Normal range of motion.  Lymphadenopathy:    She has no cervical adenopathy.    She has no axillary adenopathy.  Neurological: She is alert and oriented to person, place, and time. She has normal strength and normal reflexes. No cranial nerve deficit or sensory deficit. Gait normal.  Skin: Skin is warm, dry and intact. No rash noted.  Psychiatric: She has a normal mood and affect. Her speech is normal and behavior is normal. Thought content normal.  Nursing note and vitals reviewed.   BP (!) 136/96   Pulse 65   Temp 98.1 F (36.7 C) (Oral)   Resp 16   Ht 5\' 11"  (1.803 m)   Wt 195 lb (88.5 kg)   SpO2 98%   BMI 27.20 kg/m   Assessment and Plan: 1. Annual physical exam Normal exam DRE with firm right prostate - recent PSA < 0.1 - POCT Urinalysis Dipstick  2. Breast cancer screening Normal exam, implants soft Recent mammogram normal  3. Essential hypertension Increase dose of losartan to 100  mg - losartan (COZAAR) 100 MG tablet; Take 1 tablet (100 mg total) by mouth daily.  Dispense: 90 tablet; Refill: 3  4. Mixed hyperlipidemia On statin therapy for hx of CAD since 1997  5. Hiatal hernia with GERD Begin ranitidine bid before advancing to daily PPI - ranitidine (ZANTAC) 150 MG tablet; Take 1  tablet (150 mg total) by mouth 2 (two) times daily.  Dispense: 180 tablet; Refill: 1  6. 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 Need for pneumococcal vaccination - Pneumococcal polysaccharide vaccine 23-valent greater than or equal to 2yo subcutaneous/IM   Meds ordered this encounter  Medications  . losartan (COZAAR) 100 MG tablet    Sig: Take 1 tablet (100 mg total) by mouth daily.    Dispense:  90 tablet    Refill:  3  . ranitidine (  ZANTAC) 150 MG tablet    Sig: Take 1 tablet (150 mg total) by mouth 2 (two) times daily.    Dispense:  180 tablet    Refill:  1    Partially dictated using Editor, commissioning. Any errors are unintentional.  Halina Maidens, MD [facility]  03/30/2018   There are no diagnoses linked to this encounter.

## 2018-04-05 ENCOUNTER — Other Ambulatory Visit: Payer: Self-pay | Admitting: Family Medicine

## 2018-04-05 ENCOUNTER — Encounter: Payer: Self-pay | Admitting: Internal Medicine

## 2018-04-12 ENCOUNTER — Other Ambulatory Visit: Payer: Self-pay

## 2018-04-12 DIAGNOSIS — E782 Mixed hyperlipidemia: Secondary | ICD-10-CM

## 2018-04-12 DIAGNOSIS — Z79890 Hormone replacement therapy (postmenopausal): Secondary | ICD-10-CM

## 2018-04-12 MED ORDER — ESTRADIOL 2 MG PO TABS: 2.0000 mg | ORAL_TABLET | Freq: Every day | ORAL | 1 refills | Status: DC

## 2018-04-12 MED ORDER — ROSUVASTATIN CALCIUM 20 MG PO TABS: 20.0000 mg | ORAL_TABLET | Freq: Every day | ORAL | 1 refills | Status: DC

## 2018-04-12 MED FILL — DUPIXENT 300 MG/2 ML SAFE S: 300 | 28 days supply | Qty: 4 | Fill #1

## 2018-04-20 DIAGNOSIS — L2081 Atopic neurodermatitis: Secondary | ICD-10-CM | POA: Diagnosis not present

## 2018-04-20 DIAGNOSIS — L299 Pruritus, unspecified: Secondary | ICD-10-CM | POA: Diagnosis not present

## 2018-04-20 DIAGNOSIS — Z79899 Other long term (current) drug therapy: Secondary | ICD-10-CM | POA: Diagnosis not present

## 2018-04-22 ENCOUNTER — Other Ambulatory Visit: Payer: Self-pay

## 2018-04-22 DIAGNOSIS — L309 Dermatitis, unspecified: Secondary | ICD-10-CM

## 2018-04-22 MED ORDER — CRISABOROLE 2 % EX OINT: 1.0000 "application " | TOPICAL_OINTMENT | Freq: Two times a day (BID) | CUTANEOUS | 1 refills | Status: DC

## 2018-05-03 MED FILL — DUPIXENT 300 MG/2 ML SAFE S: 300 | 28 days supply | Qty: 4 | Fill #2

## 2018-05-04 ENCOUNTER — Inpatient Hospital Stay: Payer: 59 | Attending: Internal Medicine

## 2018-05-10 ENCOUNTER — Inpatient Hospital Stay: Payer: 59

## 2018-05-11 ENCOUNTER — Inpatient Hospital Stay: Payer: 59 | Attending: Internal Medicine

## 2018-05-11 ENCOUNTER — Inpatient Hospital Stay: Payer: 59

## 2018-05-11 DIAGNOSIS — Z8572 Personal history of non-Hodgkin lymphomas: Secondary | ICD-10-CM | POA: Insufficient documentation

## 2018-05-11 DIAGNOSIS — Z452 Encounter for adjustment and management of vascular access device: Secondary | ICD-10-CM | POA: Insufficient documentation

## 2018-05-11 DIAGNOSIS — Z95828 Presence of other vascular implants and grafts: Secondary | ICD-10-CM

## 2018-05-11 MED ORDER — HEPARIN SOD (PORK) LOCK FLUSH 100 UNIT/ML IV SOLN
INTRAVENOUS | Status: AC
  Filled 2018-05-11: qty 5

## 2018-05-11 MED ORDER — SODIUM CHLORIDE 0.9% FLUSH
10.0000 mL | INTRAVENOUS | Status: DC | PRN
  Administered 2018-05-11: 10 mL via INTRAVENOUS
  Filled 2018-05-11: qty 10

## 2018-05-11 MED ORDER — HEPARIN SOD (PORK) LOCK FLUSH 100 UNIT/ML IV SOLN
500.0000 [IU] | Freq: Once | INTRAVENOUS | Status: AC
  Administered 2018-05-11: 500 [IU] via INTRAVENOUS

## 2018-05-24 ENCOUNTER — Other Ambulatory Visit: Payer: Self-pay

## 2018-05-24 DIAGNOSIS — I10 Essential (primary) hypertension: Secondary | ICD-10-CM

## 2018-05-24 MED ORDER — METOPROLOL SUCCINATE ER 25 MG PO TB24: 25.0000 mg | ORAL_TABLET | Freq: Every day | ORAL | 0 refills | Status: DC

## 2018-05-24 MED FILL — DUPIXENT 300 MG/2 ML SAFE S: 300 | 28 days supply | Qty: 4 | Fill #3

## 2018-06-20 ENCOUNTER — Other Ambulatory Visit: Payer: Self-pay

## 2018-06-20 DIAGNOSIS — Z79890 Hormone replacement therapy (postmenopausal): Secondary | ICD-10-CM

## 2018-06-20 MED ORDER — FINASTERIDE 5 MG PO TABS: 5.0000 mg | ORAL_TABLET | Freq: Every day | ORAL | 1 refills | Status: DC

## 2018-06-20 MED FILL — DUPIXENT 300 MG/2 ML SAFE S: 300 | 28 days supply | Qty: 4 | Fill #4

## 2018-06-29 ENCOUNTER — Inpatient Hospital Stay: Payer: 59 | Attending: Internal Medicine

## 2018-06-29 DIAGNOSIS — Z8572 Personal history of non-Hodgkin lymphomas: Secondary | ICD-10-CM | POA: Insufficient documentation

## 2018-06-29 DIAGNOSIS — Z452 Encounter for adjustment and management of vascular access device: Secondary | ICD-10-CM | POA: Diagnosis not present

## 2018-06-29 DIAGNOSIS — Z95828 Presence of other vascular implants and grafts: Secondary | ICD-10-CM

## 2018-06-29 MED ORDER — HEPARIN SOD (PORK) LOCK FLUSH 100 UNIT/ML IV SOLN
500.0000 [IU] | Freq: Once | INTRAVENOUS | Status: AC
  Administered 2018-06-29: 500 [IU] via INTRAVENOUS

## 2018-06-29 MED ORDER — SODIUM CHLORIDE 0.9% FLUSH
10.0000 mL | INTRAVENOUS | Status: DC | PRN
  Administered 2018-06-29: 10 mL via INTRAVENOUS
  Filled 2018-06-29: qty 10

## 2018-07-19 ENCOUNTER — Other Ambulatory Visit: Payer: Self-pay

## 2018-07-19 MED ORDER — PANTOPRAZOLE SODIUM 40 MG PO TBEC: 40.0000 mg | DELAYED_RELEASE_TABLET | Freq: Every day | ORAL | 1 refills | Status: DC

## 2018-07-19 NOTE — Progress Notes (Unsigned)
Called pharmacy to order crestor and plavix

## 2018-08-10 ENCOUNTER — Other Ambulatory Visit: Payer: Self-pay

## 2018-08-11 MED FILL — DUPIXENT 300 MG/2 ML SAFE S: 300 | 28 days supply | Qty: 4 | Fill #5

## 2018-08-24 ENCOUNTER — Inpatient Hospital Stay: Payer: 59 | Attending: Internal Medicine

## 2018-08-24 DIAGNOSIS — Z452 Encounter for adjustment and management of vascular access device: Secondary | ICD-10-CM | POA: Diagnosis not present

## 2018-08-24 DIAGNOSIS — Z8572 Personal history of non-Hodgkin lymphomas: Secondary | ICD-10-CM | POA: Diagnosis not present

## 2018-08-24 DIAGNOSIS — Z95828 Presence of other vascular implants and grafts: Secondary | ICD-10-CM

## 2018-08-24 MED ORDER — SODIUM CHLORIDE 0.9% FLUSH
10.0000 mL | INTRAVENOUS | Status: DC | PRN
  Administered 2018-08-24: 10 mL via INTRAVENOUS
  Filled 2018-08-24: qty 10

## 2018-08-24 MED ORDER — HEPARIN SOD (PORK) LOCK FLUSH 100 UNIT/ML IV SOLN
500.0000 [IU] | Freq: Once | INTRAVENOUS | Status: AC
  Administered 2018-08-24: 500 [IU] via INTRAVENOUS

## 2018-08-25 ENCOUNTER — Other Ambulatory Visit: Payer: Self-pay

## 2018-08-25 DIAGNOSIS — I10 Essential (primary) hypertension: Secondary | ICD-10-CM

## 2018-08-25 DIAGNOSIS — Z79890 Hormone replacement therapy (postmenopausal): Secondary | ICD-10-CM

## 2018-08-25 MED ORDER — ESTRADIOL 2 MG PO TABS: 2.0000 mg | ORAL_TABLET | Freq: Every day | ORAL | 1 refills | Status: DC

## 2018-08-25 MED ORDER — LOSARTAN POTASSIUM 100 MG PO TABS: 100.0000 mg | ORAL_TABLET | Freq: Every day | ORAL | 1 refills | Status: DC

## 2018-08-25 MED ORDER — METOPROLOL SUCCINATE ER 25 MG PO TB24: 25.0000 mg | ORAL_TABLET | Freq: Every day | ORAL | 1 refills | Status: DC

## 2018-09-06 ENCOUNTER — Other Ambulatory Visit: Payer: Self-pay | Admitting: Pharmacist

## 2018-09-06 MED ORDER — DUPILUMAB 300 MG/2ML ~~LOC~~ SOSY: 2.0000 mL | PREFILLED_SYRINGE | SUBCUTANEOUS | 2 refills | Status: DC

## 2018-09-06 MED FILL — DUPIXENT 300 MG/2 ML SAFE S: 300 | 28 days supply | Qty: 4 | Fill #0

## 2018-09-27 ENCOUNTER — Other Ambulatory Visit: Payer: Self-pay | Admitting: Family Medicine

## 2018-09-27 DIAGNOSIS — L309 Dermatitis, unspecified: Secondary | ICD-10-CM

## 2018-09-27 MED FILL — DUPIXENT 300 MG/2 ML SAFE S: 300 | 28 days supply | Qty: 4 | Fill #1

## 2018-10-12 DIAGNOSIS — L2089 Other atopic dermatitis: Secondary | ICD-10-CM | POA: Diagnosis not present

## 2018-10-12 DIAGNOSIS — Z85820 Personal history of malignant melanoma of skin: Secondary | ICD-10-CM | POA: Diagnosis not present

## 2018-10-12 DIAGNOSIS — C44519 Basal cell carcinoma of skin of other part of trunk: Secondary | ICD-10-CM | POA: Diagnosis not present

## 2018-10-12 DIAGNOSIS — L812 Freckles: Secondary | ICD-10-CM | POA: Diagnosis not present

## 2018-10-12 DIAGNOSIS — L821 Other seborrheic keratosis: Secondary | ICD-10-CM | POA: Diagnosis not present

## 2018-10-12 DIAGNOSIS — D229 Melanocytic nevi, unspecified: Secondary | ICD-10-CM | POA: Diagnosis not present

## 2018-10-12 DIAGNOSIS — D2230 Melanocytic nevi of unspecified part of face: Secondary | ICD-10-CM | POA: Diagnosis not present

## 2018-10-12 DIAGNOSIS — Z1283 Encounter for screening for malignant neoplasm of skin: Secondary | ICD-10-CM | POA: Diagnosis not present

## 2018-10-12 DIAGNOSIS — D225 Melanocytic nevi of trunk: Secondary | ICD-10-CM | POA: Diagnosis not present

## 2018-10-19 ENCOUNTER — Inpatient Hospital Stay: Payer: 59 | Attending: Internal Medicine

## 2018-10-19 VITALS — BP 124/94 | HR 73 | Resp 20

## 2018-10-19 DIAGNOSIS — L309 Dermatitis, unspecified: Secondary | ICD-10-CM | POA: Insufficient documentation

## 2018-10-19 DIAGNOSIS — Z8572 Personal history of non-Hodgkin lymphomas: Secondary | ICD-10-CM | POA: Insufficient documentation

## 2018-10-19 DIAGNOSIS — Z452 Encounter for adjustment and management of vascular access device: Secondary | ICD-10-CM | POA: Insufficient documentation

## 2018-10-19 DIAGNOSIS — I10 Essential (primary) hypertension: Secondary | ICD-10-CM | POA: Diagnosis not present

## 2018-10-19 DIAGNOSIS — Z9221 Personal history of antineoplastic chemotherapy: Secondary | ICD-10-CM | POA: Diagnosis not present

## 2018-10-19 DIAGNOSIS — I2510 Atherosclerotic heart disease of native coronary artery without angina pectoris: Secondary | ICD-10-CM | POA: Insufficient documentation

## 2018-10-19 DIAGNOSIS — Z95828 Presence of other vascular implants and grafts: Secondary | ICD-10-CM

## 2018-10-19 MED ORDER — SODIUM CHLORIDE 0.9% FLUSH
10.0000 mL | INTRAVENOUS | Status: DC | PRN
  Administered 2018-10-19: 10 mL via INTRAVENOUS
  Filled 2018-10-19: qty 10

## 2018-10-19 MED ORDER — HEPARIN SOD (PORK) LOCK FLUSH 100 UNIT/ML IV SOLN
500.0000 [IU] | Freq: Once | INTRAVENOUS | Status: AC
  Administered 2018-10-19: 500 [IU] via INTRAVENOUS

## 2018-10-28 ENCOUNTER — Other Ambulatory Visit: Payer: Self-pay

## 2018-10-28 ENCOUNTER — Other Ambulatory Visit: Payer: Self-pay | Admitting: Internal Medicine

## 2018-10-28 DIAGNOSIS — E782 Mixed hyperlipidemia: Secondary | ICD-10-CM

## 2018-10-28 DIAGNOSIS — K449 Diaphragmatic hernia without obstruction or gangrene: Principal | ICD-10-CM

## 2018-10-28 DIAGNOSIS — I25118 Atherosclerotic heart disease of native coronary artery with other forms of angina pectoris: Secondary | ICD-10-CM

## 2018-10-28 DIAGNOSIS — K219 Gastro-esophageal reflux disease without esophagitis: Secondary | ICD-10-CM

## 2018-10-28 MED ORDER — CLOPIDOGREL BISULFATE 75 MG PO TABS: 75.0000 mg | ORAL_TABLET | Freq: Every day | ORAL | 1 refills | Status: DC

## 2018-10-28 MED ORDER — PANTOPRAZOLE SODIUM 40 MG PO TBEC: 40.0000 mg | DELAYED_RELEASE_TABLET | Freq: Every day | ORAL | 1 refills | Status: DC

## 2018-10-28 MED ORDER — ROSUVASTATIN CALCIUM 20 MG PO TABS: 20.0000 mg | ORAL_TABLET | Freq: Every day | ORAL | 1 refills | Status: DC

## 2018-10-28 MED FILL — DUPIXENT 300 MG/2 ML SAFE S: 300 | 28 days supply | Qty: 4 | Fill #2

## 2018-12-02 ENCOUNTER — Other Ambulatory Visit: Payer: Self-pay | Admitting: Internal Medicine

## 2018-12-05 ENCOUNTER — Other Ambulatory Visit: Payer: Self-pay | Admitting: Pharmacist

## 2018-12-05 MED ORDER — DUPILUMAB 300 MG/2ML ~~LOC~~ SOSY: 2.0000 mL | PREFILLED_SYRINGE | SUBCUTANEOUS | 3 refills | Status: DC

## 2018-12-05 MED FILL — DUPIXENT 300 MG/2 ML SAFE S: 300 | 28 days supply | Qty: 4 | Fill #0

## 2018-12-14 ENCOUNTER — Inpatient Hospital Stay: Payer: 59 | Attending: Internal Medicine

## 2018-12-20 ENCOUNTER — Other Ambulatory Visit: Payer: Self-pay | Admitting: Internal Medicine

## 2018-12-21 ENCOUNTER — Telehealth (INDEPENDENT_AMBULATORY_CARE_PROVIDER_SITE_OTHER): Payer: 59 | Admitting: Internal Medicine

## 2018-12-21 NOTE — Telephone Encounter (Signed)
Error

## 2018-12-23 ENCOUNTER — Telehealth: Payer: Self-pay | Admitting: Internal Medicine

## 2018-12-23 ENCOUNTER — Encounter: Payer: Self-pay | Admitting: Internal Medicine

## 2018-12-23 DIAGNOSIS — I10 Essential (primary) hypertension: Secondary | ICD-10-CM

## 2018-12-24 NOTE — Telephone Encounter (Signed)
error 

## 2018-12-24 NOTE — Progress Notes (Signed)
Error

## 2018-12-26 MED FILL — METOPROLOL SUCCINATE ER 25: 25 | 90 days supply | Qty: 90 | Fill #0

## 2018-12-26 MED FILL — ESTRADIOL 2 MG TABS: 2 | 90 days supply | Qty: 90 | Fill #0

## 2019-01-13 MED FILL — DUPIXENT 300 MG/2 ML SAFE S: 300 | 28 days supply | Qty: 4 | Fill #1

## 2019-01-18 ENCOUNTER — Inpatient Hospital Stay: Payer: 59 | Attending: Hematology and Oncology

## 2019-01-18 ENCOUNTER — Ambulatory Visit: Payer: 59 | Admitting: Hematology and Oncology

## 2019-01-18 ENCOUNTER — Other Ambulatory Visit: Payer: Self-pay

## 2019-01-18 VITALS — BP 139/90 | HR 62 | Temp 98.0°F | Resp 18

## 2019-01-18 DIAGNOSIS — Z8572 Personal history of non-Hodgkin lymphomas: Secondary | ICD-10-CM | POA: Diagnosis not present

## 2019-01-18 DIAGNOSIS — Z452 Encounter for adjustment and management of vascular access device: Secondary | ICD-10-CM | POA: Insufficient documentation

## 2019-01-18 DIAGNOSIS — Z95828 Presence of other vascular implants and grafts: Secondary | ICD-10-CM

## 2019-01-18 MED ORDER — HEPARIN SOD (PORK) LOCK FLUSH 100 UNIT/ML IV SOLN
500.0000 [IU] | Freq: Once | INTRAVENOUS | Status: AC
  Administered 2019-01-18: 12:00:00 500 [IU] via INTRAVENOUS

## 2019-01-18 MED ORDER — SODIUM CHLORIDE 0.9% FLUSH
10.0000 mL | INTRAVENOUS | Status: DC | PRN
  Administered 2019-01-18: 10 mL via INTRAVENOUS
  Filled 2019-01-18: qty 10

## 2019-01-24 ENCOUNTER — Ambulatory Visit: Payer: 59 | Admitting: Internal Medicine

## 2019-01-24 ENCOUNTER — Other Ambulatory Visit: Payer: 59

## 2019-01-27 ENCOUNTER — Other Ambulatory Visit: Payer: Self-pay

## 2019-01-27 DIAGNOSIS — Z79890 Hormone replacement therapy (postmenopausal): Secondary | ICD-10-CM

## 2019-01-27 DIAGNOSIS — E782 Mixed hyperlipidemia: Secondary | ICD-10-CM

## 2019-01-27 DIAGNOSIS — K449 Diaphragmatic hernia without obstruction or gangrene: Secondary | ICD-10-CM

## 2019-01-27 DIAGNOSIS — I25118 Atherosclerotic heart disease of native coronary artery with other forms of angina pectoris: Secondary | ICD-10-CM

## 2019-01-27 DIAGNOSIS — K219 Gastro-esophageal reflux disease without esophagitis: Secondary | ICD-10-CM

## 2019-01-27 MED ORDER — FINASTERIDE 5 MG PO TABS: 5.0000 mg | ORAL_TABLET | Freq: Every day | ORAL | 0 refills | Status: DC

## 2019-01-27 MED ORDER — PANTOPRAZOLE SODIUM 40 MG PO TBEC: 40.0000 mg | DELAYED_RELEASE_TABLET | Freq: Every day | ORAL | 0 refills | Status: DC

## 2019-01-27 MED ORDER — CLOPIDOGREL BISULFATE 75 MG PO TABS: 75.0000 mg | ORAL_TABLET | Freq: Every day | ORAL | 0 refills | Status: DC

## 2019-01-27 MED ORDER — ROSUVASTATIN CALCIUM 20 MG PO TABS: 20.0000 mg | ORAL_TABLET | Freq: Every day | ORAL | 0 refills | Status: DC

## 2019-02-15 ENCOUNTER — Ambulatory Visit: Payer: 59 | Admitting: Hematology and Oncology

## 2019-02-15 MED FILL — DUPIXENT 300 MG/2 ML SAFE S: 300 | 28 days supply | Qty: 4 | Fill #2

## 2019-03-14 NOTE — Progress Notes (Signed)
[facility]  [address] [place], [place] [address] Phone: [phone]  Fax: [phone]   Clinic Day:  03/15/2019  Referring physician: Glean Hess, MD  Chief Complaint: Kendra Salas is a 68 y.o. adult with stage IIIB diffuse large B-cell lymphoma who is seen for new Salas assessment.   HPI:  Kendra Salas was diagnosed with diffuse large B-cell lymphoma in 2015. She presented with sweats, weight loss, abdominal pain, palpable splenomegaly to her umbilicus, and a firm, non-tender enlarged lymph node in her neck.  Abdominal ultrasound on 06/12/2014 revealed an enlarged spleen (19.3 cm) with multiple inhomogeneous solid masses worrisome for metastatic involvement of Kendra spleen versus primary splenic neoplasm.  PET scan on 06/13/2014 revealed hypermetabolic left neck lymph node at Kendra level of Kendra angle of  Kendra jaw consistent with metastasis. There was a hypermetabolic mass beneath Kendra right pectoralis muscle consistent with metastasis. There was hypermetabolic nodule along Kendra pleural space adjacent to Kendra heart and aorta consistent with metastatic implant.  There was enlargement of Kendra spleen with a hypermetabolic mass consistent with metastatic disease. There were several indeterminate lesions.  There was a small focus of activity along Kendra ascending colon as well as activity anterior to Kendra bladder at site of prior surgery.   She was initially seen by Dr Oliva Bustard. Ultrasound guided right axillary biopsy on 06/19/2014 revealed CD20+ high grade B-cell lymphoma best classified as diffuse large B cell lymphoma, activated B-cell type with CD5 coexpression.  FISH studies for MYC and BCL2 were negative.  BCL6 was positive.  She underwent a bone marrow biopsy (no report available).  She received 6 cycles of RCHOP chemotherapy from 06/2014 - 10/2014.  PET scan on 09/24/2014 revealed a completed response.   PET scan on 12/03/2015 revealed no evidence of  residual or recurrent hypermetabolic lymphoma. There was age advanced coronary artery atherosclerosis.   MUGA on 06/22/2014 revealed an EF of 70%.  MUGA on 09/21/2014 revealed an EF of 67%.  EGD on 11/22/2015 revealed a 5 cm hiatal hernia. There was benign-appearing esophageal stenosis, gastritis, and duodenitis.  Kendra Salas transitioned to Kendra care of Dr. Charlaine Dalton on 09/22/2016.   Abdomen and pelvis CT on 07/07/2017 revealed colonic diverticulosis, without radiographic evidence of diverticulitis. There was mild diffuse wall thickening of urinary bladder, suspicious for cystitis. There was a tiny amount of gas within Kendra urinary bladder, likely due to recent catheterization. There was a stable small hiatal hernia and fat-containing paraumbilical hernia.  Kendra Salas was last seen in [facility] medical oncology clinic on 01/11/2018.  At that time, she was asymptomatic. She had a flare-up of her eczema. She denied any new lumps or bumps, weight loss, or night sweats.  Port flushes were scheduled every 2 months in addition to annual follow-up.   She was put on Dupixent for eczema on 02/09/2018.  She saw her PCP Halina Maidens, MD, on 03/30/2018 for annual exam. She continued on losartan and zantac.   Symptomatically, she is doing "okay." She denies any fevers, sweats, or weight loss. Her eczema has significantly improved. She has esophageal dysphagia. She reports night sweats when it got warmer, which have resolved since increasing air conditioning.   Blood pressure in Kendra clinic today was 168/105. She has bilateral edema in her ankles. She has tenderness in her left mandible for 6-8 weeks and torticollis on Kendra left side, weekly, that lasts for a few minutes before going away.   She has 8 stents  in her heart, atherosclerosis, and advanced coronary artery disease. She is followed in cardiology by Dr. Nehemiah Massed.   She  is agreeable to have her port removed. Dr. Leotis Pain inserted it in 2015.    Past Medical History:  Diagnosis Date  . Anxiety   . CAD (coronary artery disease)   . Clotting disorder ([place])   . Eczema   . GERD (gastroesophageal reflux disease)   . Hyperlipemia   . Hypertension   . Lymphoma ([place])   . Melanoma ([place])   . Personal history of chemotherapy     Past Surgical History:  Procedure Laterality Date  . AUGMENTATION MAMMAPLASTY Bilateral 2003  . CARDIAC CATHETERIZATION     X2 - has 5 stents. 4 LAD, 1 posterior, Last 3 placed 2004  . COLONOSCOPY  06/29/2012   repeat 10 yrs - Dr. Candace Cruise  . ESOPHAGOGASTRODUODENOSCOPY (EGD) WITH PROPOFOL N/A 11/22/2015   Procedure: ESOPHAGOGASTRODUODENOSCOPY (EGD) WITH PROPOFOL;  Surgeon: Lucilla Lame, MD;  Location: [facility];  Service: Endoscopy;  Laterality: N/A;  . FACIAL COSMETIC SURGERY    . gender re-affirmation    . HERNIA REPAIR     x2    Family History  Problem Relation Age of Onset  . Breast cancer Maternal Aunt 93    Social History:  reports that she has never smoked. She has never used smokeless tobacco. She reports current alcohol use of about 1.0 standard drinks of alcohol per week. She reports that she does not use drugs.  She lives in [place] with her daughter, and has since 44. She is a physician. Kendra Salas is alone today.  Allergies: No Known Allergies  Current Medications: Current Outpatient Medications  Medication Sig Dispense Refill  . aspirin 81 MG tablet Take 81 mg by mouth daily.    . clopidogrel (PLAVIX) 75 MG tablet Take 1 tablet (75 mg total) by mouth daily. 90 tablet 0  . Dupilumab (DUPIXENT) 300 MG/2ML SOSY Inject 300 mg into Kendra skin every 14 (fourteen) days. Inject 1 syringe under Kendra skin every 2 weeks 4 mL 3  . estradiol (ESTRACE) 2 MG tablet Take 1 tablet (2 mg total) by mouth daily. 90 tablet 1  . EUCRISA 2 % OINT APPLY 1 APPLICATION TOPICALLY 2 (TWO) TIMES DAILY. 60 g 1  . finasteride (PROSCAR) 5 MG tablet Take 1 tablet (5 mg total) by mouth daily. 90 tablet 0  .  hydrOXYzine (ATARAX/VISTARIL) 10 MG tablet Take 1 tablet (10 mg total) by mouth every 6 (six) hours as needed for itching. 90 tablet 3  . loratadine (CLARITIN) 10 MG tablet Take 1 tablet (10 mg total) by mouth daily. (Salas taking differently: Take 10 mg by mouth daily as needed. ) 30 tablet 11  . losartan (COZAAR) 100 MG tablet Take 1 tablet (100 mg total) by mouth daily. 90 tablet 1  . metoprolol succinate (TOPROL-XL) 25 MG 24 hr tablet Take 1 tablet (25 mg total) by mouth daily. 90 tablet 1  . ondansetron (ZOFRAN) 4 MG tablet Take 1 tablet (4 mg total) by mouth every 8 (eight) hours as needed for nausea or vomiting. 12 tablet 0  . pantoprazole (PROTONIX) 40 MG tablet Take 1 tablet (40 mg total) by mouth daily. 90 tablet 0  . promethazine (PHENERGAN) 25 MG tablet Take 25 mg by mouth every 6 (six) hours as needed.     . rosuvastatin (CRESTOR) 20 MG tablet Take 1 tablet (20 mg total) by mouth daily. 90 tablet 0  .  triamcinolone cream (KENALOG) 0.1 % Apply 1 application topically 2 (two) times daily. (Salas taking differently: Apply 1 application topically as needed. ) 30 g 0   No current facility-administered medications for this visit.    Facility-Administered Medications Ordered in Other Visits  Medication Dose Route Frequency Provider Last Rate Last Dose  . heparin lock flush 100 unit/mL  500 Units Intravenous Once Choksi, Janak, MD      . sodium chloride 0.9 % injection 10 mL  10 mL Intravenous PRN Choksi, Janak, MD      . sodium chloride flush (NS) 0.9 % injection 10 mL  10 mL Intravenous PRN Nolon Stalls C, MD   10 mL at 03/15/19 1532    Review of Systems  Constitutional: Positive for diaphoresis (night sweats, resolved). Negative for chills, fever, malaise/fatigue and weight loss.  HENT: Negative for congestion, hearing loss, sinus pain and sore throat.   Eyes: Negative for blurred vision.  Respiratory: Negative for cough, sputum production and shortness of breath.    Cardiovascular: Negative for chest pain, palpitations, orthopnea, claudication and PND.  Gastrointestinal: Negative for abdominal pain, blood in stool, constipation, diarrhea, heartburn, melena, nausea and vomiting.       Esophageal dysphagia.  Genitourinary: Negative for dysuria, frequency, hematuria and urgency.  Musculoskeletal: Positive for joint pain (left mandible pain) and neck pain (torticollis, left side, occasional). Negative for back pain and myalgias.  Skin: Negative for rash.  Neurological: Negative for dizziness, tingling, sensory change, weakness and headaches.  Endo/Heme/Allergies: Does not bruise/bleed easily.  Psychiatric/Behavioral: Negative for depression and memory loss. Kendra Salas is not nervous/anxious and does not have insomnia.   All other systems reviewed and are negative.  Performance status (ECOG): 0  Blood pressure (!) 168/105, pulse 60, temperature (!) 97.2 F (36.2 C), temperature source Tympanic, resp. rate 18, weight 204 lb 0.6 oz (92.5 kg).   Physical Exam  Constitutional: She is oriented to person, place, and time. She appears well-developed and well-nourished. No distress.  HENT:  Head: Normocephalic and atraumatic.  Mouth/Throat: Oropharynx is clear and moist. No oropharyngeal exudate.  Long, wavy white hair. Mask.   Eyes: Pupils are equal, round, and reactive to light. Conjunctivae and EOM are normal. No scleral icterus.  Wearing goggles.  Blue eyes.  Neck: Normal range of motion. Neck supple.  Cardiovascular: Normal rate, regular rhythm and normal heart sounds.  No murmur heard. Pulmonary/Chest: Effort normal and breath sounds normal. No respiratory distress. She has no wheezes.  Port-a-cath in place.  Abdominal: Soft. Bowel sounds are normal. She exhibits no distension. There is no abdominal tenderness. A hernia (umbilical) is present.  Musculoskeletal: Normal range of motion.        General: Tenderness (left mandible) and edema (bilateral  ankles) present.  Lymphadenopathy:       Head (right side): No submental, no submandibular, no preauricular, no posterior auricular and no occipital adenopathy present.       Head (left side): No submental, no submandibular, no preauricular, no posterior auricular and no occipital adenopathy present.    She has no cervical adenopathy.    She has no axillary adenopathy.       Right: No inguinal and no supraclavicular adenopathy present.       Left: No inguinal and no supraclavicular adenopathy present.  Neurological: She is alert and oriented to person, place, and time.  Skin: Skin is warm and dry. Bruising (abdomen) and rash (macular papular rash, chronic) noted. She is not diaphoretic. No erythema.  Psychiatric: She has a normal mood and affect. Her behavior is normal. Judgment and thought content normal.  Nursing note and vitals reviewed.   No visits with results within 3 Day(s) from this visit.  Latest known visit with results is:  Abstract on 04/05/2018  Component Date Value Ref Range Status  . HM Colonoscopy 06/29/2012 See Report (in chart)  See Report (in chart), Salas Reported Final    Assessment:  NAUTIKA CRESSEY is a 68 y.o. with stage IIIB diffuse large B-cell lymphoma s/p right axillary biopsy on 06/19/2014.  Pathology revealed CD20+ high grade B-cell lymphoma best classified as diffuse large B cell lymphoma, activated B-cell type with CD5 coexpression.  FISH studies for MYC and BCL2 were negative.  BCL6 was positive.  Bone marrow was negative by report.  PET scan on 06/13/2014 revealed hypermetabolic left neck lymph node at Kendra level of Kendra angle of  Kendra jaw consistent with metastasis.   There was a hypermetabolic mass beneath Kendra right pectoralis muscle consistent with metastasis. There was hypermetabolic nodule along Kendra pleural space adjacent to Kendra heart and aorta consistent with metastatic implant.  There was enlargement of Kendra spleen with a hypermetabolic mass consistent  with metastatic disease.   There were several indeterminate lesions.  There was a small focus of activity along Kendra ascending colon as well as activity anterior to Kendra bladder at site of prior surgery.   She received 6 cycles of RCHOP from 06/2014 - 10/2014.    PET scan on 09/24/2014 revealed a completed response.  PET scan on 12/03/2015 revealed no evidence of residual or recurrent hypermetabolic lymphoma. There was age advanced coronary artery atherosclerosis.   Abdomen and pelvis CT on 07/07/2017 revealed colonic diverticulosis, without radiographic evidence of diverticulitis. There was mild diffuse wall thickening of urinary bladder, suspicious for cystitis. There was a tiny amount of gas within Kendra urinary bladder, likely due to recent catheterization. There was a stable small hiatal hernia and fat-containing paraumbilical hernia.  MUGA revealed an EF of 70% on 06/22/2014 and an EF of 67% on 09/21/2014.  Symptomatically, she denies any B symptoms.  Exam reveals no adenopathy or hepatosplenomegaly.  Plan: 1.   Labs today:  CBC with diff, CMP, LDH, uric acid. 2.   Stage IIIB diffuse large B cell lymphoma  Discuss entire medical history, diagnosis and management of diffuse large B cell lymphoma  She has received 6 cycles of RCHOP chemotherapy.  Prior scans reveal no evidence of recurrent disease.  Discuss plan for ongoing surveillance.  Discuss no plans for imaging unless any concerning symptoms, exam or laboratory abnormality.  Discuss port-a-cath removal. 3.   Port flushes every 8-12 weeks until removed. 4.   RTC in 1 year for MD assessment and labs (CBC with diff, CMP, LDH, uric acid).  I discussed Kendra assessment and treatment plan with Kendra Salas.  Kendra Salas was provided an opportunity to ask questions and all were answered.  Kendra Salas agreed with Kendra plan and demonstrated an understanding of Kendra instructions.  Kendra Salas was advised to call back if Kendra symptoms worsen or if Kendra  condition fails to improve as anticipated.  I provided 30 minutes of face-to-face time during this this encounter and > 50% was spent counseling as documented under my assessment and plan.    Lequita Asal, MD, PhD    03/15/2019, 4:02 PM  I, Molly Dorshimer, am acting as Education administrator for [employer]. Mike Gip, MD, PhD.  I, Melissa C. Mike Gip,  MD, have reviewed Kendra above documentation for accuracy and completeness, and I agree with Kendra above.

## 2019-03-15 ENCOUNTER — Inpatient Hospital Stay: Payer: 59

## 2019-03-15 ENCOUNTER — Other Ambulatory Visit: Payer: Self-pay | Admitting: Internal Medicine

## 2019-03-15 ENCOUNTER — Other Ambulatory Visit: Payer: Self-pay

## 2019-03-15 ENCOUNTER — Telehealth: Payer: Self-pay

## 2019-03-15 ENCOUNTER — Inpatient Hospital Stay: Payer: 59 | Attending: Internal Medicine | Admitting: Hematology and Oncology

## 2019-03-15 ENCOUNTER — Encounter: Payer: Self-pay | Admitting: Hematology and Oncology

## 2019-03-15 VITALS — BP 168/105 | HR 60 | Temp 97.2°F | Resp 18 | Wt 204.0 lb

## 2019-03-15 DIAGNOSIS — Z85528 Personal history of other malignant neoplasm of kidney: Secondary | ICD-10-CM | POA: Diagnosis not present

## 2019-03-15 DIAGNOSIS — Z95828 Presence of other vascular implants and grafts: Secondary | ICD-10-CM

## 2019-03-15 DIAGNOSIS — C8334 Diffuse large B-cell lymphoma, lymph nodes of axilla and upper limb: Secondary | ICD-10-CM

## 2019-03-15 DIAGNOSIS — Z8572 Personal history of non-Hodgkin lymphomas: Secondary | ICD-10-CM | POA: Insufficient documentation

## 2019-03-15 DIAGNOSIS — Z9221 Personal history of antineoplastic chemotherapy: Secondary | ICD-10-CM | POA: Diagnosis not present

## 2019-03-15 LAB — CBC WITH DIFFERENTIAL/PLATELET
Abs Immature Granulocytes: 0.03 10*3/uL (ref 0.00–0.07)
Basophils Absolute: 0.1 10*3/uL (ref 0.0–0.1)
Basophils Relative: 1 %
Eosinophils Absolute: 0.2 10*3/uL (ref 0.0–0.5)
Eosinophils Relative: 2 %
HCT: 37.3 % (ref 36.0–46.0)
Hemoglobin: 12.7 g/dL (ref 12.0–15.0)
Immature Granulocytes: 0 %
Lymphocytes Relative: 24 %
Lymphs Abs: 1.8 10*3/uL (ref 0.7–4.0)
MCH: 28.3 pg (ref 26.0–34.0)
MCHC: 34 g/dL (ref 30.0–36.0)
MCV: 83.3 fL (ref 80.0–100.0)
Monocytes Absolute: 0.7 10*3/uL (ref 0.1–1.0)
Monocytes Relative: 10 %
Neutro Abs: 4.8 10*3/uL (ref 1.7–7.7)
Neutrophils Relative %: 63 %
Platelets: 222 10*3/uL (ref 150–400)
RBC: 4.48 MIL/uL (ref 3.87–5.11)
RDW: 14.4 % (ref 11.5–15.5)
WBC: 7.5 10*3/uL (ref 4.0–10.5)
nRBC: 0 % (ref 0.0–0.2)

## 2019-03-15 LAB — COMPREHENSIVE METABOLIC PANEL
ALT: 19 U/L (ref 0–44)
AST: 20 U/L (ref 15–41)
Albumin: 3.9 g/dL (ref 3.5–5.0)
Alkaline Phosphatase: 48 U/L (ref 38–126)
Anion gap: 7 (ref 5–15)
BUN: 11 mg/dL (ref 8–23)
CO2: 23 mmol/L (ref 22–32)
Calcium: 8.2 mg/dL — ABNORMAL LOW (ref 8.9–10.3)
Chloride: 107 mmol/L (ref 98–111)
Creatinine, Ser: 0.72 mg/dL (ref 0.44–1.00)
GFR calc Af Amer: >60 mL/min (ref >60)
GFR calc non Af Amer: >60 mL/min (ref >60)
Glucose, Bld: 83 mg/dL (ref 70–99)
Potassium: 3.6 mmol/L (ref 3.5–5.1)
Sodium: 137 mmol/L (ref 135–145)
Total Bilirubin: 0.4 mg/dL (ref 0.3–1.2)
Total Protein: 7.3 g/dL (ref 6.5–8.1)

## 2019-03-15 LAB — URIC ACID: Uric Acid, Serum: 4 mg/dL (ref 2.5–7.1)

## 2019-03-15 LAB — LACTATE DEHYDROGENASE: LDH: 125 U/L (ref 98–192)

## 2019-03-15 MED ORDER — HEPARIN SOD (PORK) LOCK FLUSH 100 UNIT/ML IV SOLN
500.0000 [IU] | Freq: Once | INTRAVENOUS | Status: AC
  Administered 2019-03-15: 500 [IU] via INTRAVENOUS

## 2019-03-15 MED ORDER — SODIUM CHLORIDE 0.9% FLUSH
10.0000 mL | INTRAVENOUS | Status: DC | PRN
  Administered 2019-03-15: 10 mL via INTRAVENOUS
  Filled 2019-03-15: qty 10

## 2019-03-15 NOTE — Telephone Encounter (Signed)
-----   Message from Lequita Asal, MD sent at 03/15/2019  4:57 PM EDT ----- Regarding: Please call patient  Calcium is low (8.2).  Encourage calcium supplementation.  M ----- Message ----- From: Buel Ream, Lab In [place] Sent: 03/15/2019   4:40 PM EDT To: Lequita Asal, MD

## 2019-03-15 NOTE — Telephone Encounter (Signed)
Informed patient of Calcium level. Advised Dr. Mike Gip would like her to start taking Calcium 1200 MG with Vit D 800 IU. Patient verbalizes understanding and denies any further questions.

## 2019-03-15 NOTE — Progress Notes (Signed)
Pt here for follow up. Reports some tenderness to left mandible area x 6-8 weeks. Reports torticollis on left side as well, weekly.

## 2019-03-17 ENCOUNTER — Other Ambulatory Visit: Payer: Self-pay

## 2019-03-17 DIAGNOSIS — I10 Essential (primary) hypertension: Secondary | ICD-10-CM

## 2019-03-17 MED ORDER — METOPROLOL SUCCINATE ER 50 MG PO TB24: 50.0000 mg | ORAL_TABLET | Freq: Every day | ORAL | 0 refills | Status: DC

## 2019-03-20 ENCOUNTER — Other Ambulatory Visit: Payer: Self-pay | Admitting: Internal Medicine

## 2019-03-20 DIAGNOSIS — L309 Dermatitis, unspecified: Secondary | ICD-10-CM

## 2019-03-20 MED ORDER — EUCRISA 2 % EX OINT: 1.0000 "application " | TOPICAL_OINTMENT | Freq: Two times a day (BID) | CUTANEOUS | 2 refills | Status: DC

## 2019-03-21 ENCOUNTER — Other Ambulatory Visit: Payer: Self-pay | Admitting: Internal Medicine

## 2019-03-21 DIAGNOSIS — K594 Anal spasm: Secondary | ICD-10-CM

## 2019-03-21 MED ORDER — HYOSCYAMINE SULFATE 0.125 MG PO TBDP: 0.1250 mg | ORAL_TABLET | Freq: Four times a day (QID) | ORAL | 0 refills | Status: AC | PRN

## 2019-03-24 ENCOUNTER — Other Ambulatory Visit: Payer: Self-pay | Admitting: Internal Medicine

## 2019-03-24 DIAGNOSIS — I10 Essential (primary) hypertension: Secondary | ICD-10-CM

## 2019-03-24 MED ORDER — LOSARTAN POTASSIUM 100 MG PO TABS: 100.0000 mg | ORAL_TABLET | Freq: Every day | ORAL | 1 refills | Status: DC

## 2019-03-24 MED FILL — DUPIXENT 300 MG/2 ML SAFE S: 300 | 28 days supply | Qty: 4 | Fill #3

## 2019-03-26 DIAGNOSIS — Z95828 Presence of other vascular implants and grafts: Secondary | ICD-10-CM | POA: Insufficient documentation

## 2019-03-31 ENCOUNTER — Ambulatory Visit (INDEPENDENT_AMBULATORY_CARE_PROVIDER_SITE_OTHER): Payer: 59 | Admitting: Vascular Surgery

## 2019-03-31 ENCOUNTER — Other Ambulatory Visit: Payer: Self-pay

## 2019-03-31 ENCOUNTER — Encounter (INDEPENDENT_AMBULATORY_CARE_PROVIDER_SITE_OTHER): Payer: Self-pay | Admitting: Vascular Surgery

## 2019-03-31 VITALS — BP 151/95 | HR 56 | Resp 10 | Ht 72.0 in | Wt 202.0 lb

## 2019-03-31 DIAGNOSIS — Z79899 Other long term (current) drug therapy: Secondary | ICD-10-CM

## 2019-03-31 DIAGNOSIS — C8334 Diffuse large B-cell lymphoma, lymph nodes of axilla and upper limb: Secondary | ICD-10-CM

## 2019-03-31 DIAGNOSIS — Z95828 Presence of other vascular implants and grafts: Secondary | ICD-10-CM

## 2019-03-31 DIAGNOSIS — I2510 Atherosclerotic heart disease of native coronary artery without angina pectoris: Secondary | ICD-10-CM

## 2019-03-31 DIAGNOSIS — I10 Essential (primary) hypertension: Secondary | ICD-10-CM | POA: Diagnosis not present

## 2019-03-31 NOTE — Assessment & Plan Note (Signed)
Completed therapy and doing well

## 2019-03-31 NOTE — Assessment & Plan Note (Signed)
blood pressure control important in reducing the progression of atherosclerotic disease. On appropriate oral medications.  

## 2019-03-31 NOTE — Assessment & Plan Note (Signed)
We discussed the Port-A-Cath removal is a minor outpatient procedure.  We discussed that leaving in place would be an option, but obviously it is not being used there is a small risk of infection and thrombosis associated with the catheters and removal would be reasonable.  She is agreeable to go ahead and remove the port and this will be scheduled for the next week or so.

## 2019-03-31 NOTE — Progress Notes (Addendum)
Patient ID: Kendra Salas, adult   DOB: 19-Dec-1950, 68 y.o.   MRN: [id number]  Chief Complaint  Patient presents with   New Patient (Initial Visit)    ref Mike Gip to discuss port removal    HPI Kendra Salas is a 68 y.o. adult.  I am asked to see the patient by Dr. Mike Gip for evaluation of removal of her port.  I placed this port almost 5 years ago and it has served her well.  She is really not had any problems from the port but no longer needs it and has only been used for 5 rare IV access over the past few years.  No fevers or chills.  No systemic infection.  After discussions with her oncologist, they decided it was time to remove the port and she is here to discuss removal.     Past Medical History:  Diagnosis Date   Anxiety    CAD (coronary artery disease)    Clotting disorder ([place])    Eczema    GERD (gastroesophageal reflux disease)    Hyperlipemia    Hypertension    Lymphoma ([place])    Melanoma ([place])    Personal history of chemotherapy     Past Surgical History:  Procedure Laterality Date   AUGMENTATION MAMMAPLASTY Bilateral 2003   CARDIAC CATHETERIZATION     X2 - has 5 stents. 4 LAD, 1 posterior, Last 3 placed 2004   COLONOSCOPY  06/29/2012   repeat 10 yrs - Dr. Candace Cruise   ESOPHAGOGASTRODUODENOSCOPY (EGD) WITH PROPOFOL N/A 11/22/2015   Procedure: ESOPHAGOGASTRODUODENOSCOPY (EGD) WITH PROPOFOL;  Surgeon: Lucilla Lame, MD;  Location: [facility];  Service: Endoscopy;  Laterality: N/A;   FACIAL COSMETIC SURGERY     gender re-affirmation     HERNIA REPAIR     x2    Family History Family History  Problem Relation Age of Onset   Breast cancer Maternal Aunt 72    Social History Social History   Tobacco Use   Smoking status: Never Smoker   Smokeless tobacco: Never Used  Substance Use Topics   Alcohol use: Yes    Alcohol/week: 1.0 standard drinks    Types: 1 Shots of liquor per week   Drug use: Never     No Known  Allergies  Current Outpatient Medications  Medication Sig Dispense Refill   aspirin 81 MG tablet Take 81 mg by mouth daily.     clopidogrel (PLAVIX) 75 MG tablet Take 1 tablet (75 mg total) by mouth daily. 90 tablet 0   Crisaborole (EUCRISA) 2 % OINT Apply 1 application topically 2 (two) times a day. 100 g 2   Dupilumab (DUPIXENT) 300 MG/2ML SOSY Inject 300 mg into the skin every 14 (fourteen) days. Inject 1 syringe under the skin every 2 weeks 4 mL 3   estradiol (ESTRACE) 2 MG tablet Take 1 tablet (2 mg total) by mouth daily. 90 tablet 1   finasteride (PROSCAR) 5 MG tablet Take 1 tablet (5 mg total) by mouth daily. 90 tablet 0   hydrOXYzine (ATARAX/VISTARIL) 10 MG tablet Take 1 tablet (10 mg total) by mouth every 6 (six) hours as needed for itching. 90 tablet 3   hyoscyamine (ANASPAZ) 0.125 MG TBDP disintergrating tablet Place 1 tablet (0.125 mg total) under the tongue every 6 (six) hours as needed. 30 tablet 0   loratadine (CLARITIN) 10 MG tablet Take 1 tablet (10 mg total) by mouth daily. (Patient taking differently: Take 10 mg by mouth  daily as needed. ) 30 tablet 11   losartan (COZAAR) 100 MG tablet Take 1 tablet (100 mg total) by mouth daily. 90 tablet 1   metoprolol succinate (TOPROL-XL) 50 MG 24 hr tablet Take 1 tablet (50 mg total) by mouth daily. 30 tablet 0   ondansetron (ZOFRAN) 4 MG tablet Take 1 tablet (4 mg total) by mouth every 8 (eight) hours as needed for nausea or vomiting. 12 tablet 0   pantoprazole (PROTONIX) 40 MG tablet Take 1 tablet (40 mg total) by mouth daily. 90 tablet 0   promethazine (PHENERGAN) 25 MG tablet Take 25 mg by mouth every 6 (six) hours as needed.      rosuvastatin (CRESTOR) 20 MG tablet Take 1 tablet (20 mg total) by mouth daily. 90 tablet 0   triamcinolone cream (KENALOG) 0.1 % Apply 1 application topically 2 (two) times daily. (Patient taking differently: Apply 1 application topically as needed. ) 30 g 0   No current  facility-administered medications for this visit.    Facility-Administered Medications Ordered in Other Visits  Medication Dose Route Frequency Provider Last Rate Last Dose   heparin lock flush 100 unit/mL  500 Units Intravenous Once Choksi, Janak, MD       sodium chloride 0.9 % injection 10 mL  10 mL Intravenous PRN Forest Gleason, MD          REVIEW OF SYSTEMS (Negative unless checked)  Constitutional: [] Weight loss  [] Fever  [] Chills Cardiac: [] Chest pain   [] Chest pressure   [] Palpitations   [] Shortness of breath when laying flat   [] Shortness of breath at rest   [] Shortness of breath with exertion. Vascular:  [] Pain in legs with walking   [] Pain in legs at rest   [] Pain in legs when laying flat   [] Claudication   [] Pain in feet when walking  [] Pain in feet at rest  [] Pain in feet when laying flat   [] History of DVT   [] Phlebitis   [] Swelling in legs   [] Varicose veins   [] Non-healing ulcers Pulmonary:   [] Uses home oxygen   [] Productive cough   [] Hemoptysis   [] Wheeze  [] COPD   [] Asthma Neurologic:  [] Dizziness  [] Blackouts   [] Seizures   [] History of stroke   [] History of TIA  [] Aphasia   [] Temporary blindness   [] Dysphagia   [] Weakness or numbness in arms   [] Weakness or numbness in legs Musculoskeletal:  [] Arthritis   [] Joint swelling   [] Joint pain   [] Low back pain Hematologic:  [] Easy bruising  [] Easy bleeding   [] Hypercoagulable state   [] Anemic  [] Hepatitis Gastrointestinal:  [] Blood in stool   [] Vomiting blood  [x] Gastroesophageal reflux/heartburn   [] Abdominal pain Genitourinary:  [] Chronic kidney disease   [] Difficult urination  [] Frequent urination  [] Burning with urination   [] Hematuria Skin:  [] Rashes   [] Ulcers   [] Wounds Psychological:  [x] History of anxiety   []  History of major depression.    Physical Exam BP (!) 151/95 (BP Location: Right Arm)    Pulse (!) 56    Resp 10    Ht 6' (1.829 m)    Wt 202 lb (91.6 kg)    BMI 27.40 kg/m  Gen:  WD/WN, NAD Head: [place]/AT, No  temporalis wasting Ear/Nose/Throat: Hearing grossly intact, nares w/o erythema or drainage, oropharynx w/o Erythema/Exudate Eyes: Conjunctiva clear, sclera non-icteric  Neck: trachea midline.  No JVD.  Pulmonary:  Good air movement, respirations not labored, no use of accessory muscles  Cardiac: RRR, no JVD  Musculoskeletal:   Extremities without ischemic changes.  No deformity or atrophy. No edema. Neurologic: Symmetrical.  Speech is fluent.  Psychiatric: Judgment intact, Mood & affect appropriate for pt's clinical situation. Dermatologic: No rashes or ulcers noted.  No cellulitis or open wounds.    Radiology No results found.  Labs Recent Results (from the past 2160 hour(s))  Uric acid     Status: None   Collection Time: 03/15/19  4:33 PM  Result Value Ref Range   Uric Acid, Serum 4.0 2.5 - 7.1 mg/dL    Comment: Performed at [facility], [address], [place], [place] [address]  Lactate dehydrogenase     Status: None   Collection Time: 03/15/19  4:33 PM  Result Value Ref Range   LDH 125 98 - 192 U/L    Comment: Performed at [facility], [address] [place], [place] [address]  Comprehensive metabolic panel     Status: Abnormal   Collection Time: 03/15/19  4:33 PM  Result Value Ref Range   Sodium 137 135 - 145 mmol/L   Potassium 3.6 3.5 - 5.1 mmol/L   Chloride 107 98 - 111 mmol/L   CO2 23 22 - 32 mmol/L   Glucose, Bld 83 70 - 99 mg/dL   BUN 11 8 - 23 mg/dL   Creatinine, Ser 0.72 0.44 - 1.00 mg/dL   Calcium 8.2 (L) 8.9 - 10.3 mg/dL   Total Protein 7.3 6.5 - 8.1 g/dL   Albumin 3.9 3.5 - 5.0 g/dL   AST 20 15 - 41 U/L   ALT 19 0 - 44 U/L   Alkaline Phosphatase 48 38 - 126 U/L   Total Bilirubin 0.4 0.3 - 1.2 mg/dL   GFR calc non Af Amer >60 >60 mL/min   GFR calc Af Amer >60 >60 mL/min   Anion gap 7 5 - 15    Comment: Performed at [facility], [address], [place], [place] [address]  CBC with Differential/Platelet      Status: None   Collection Time: 03/15/19  4:33 PM  Result Value Ref Range   WBC 7.5 4.0 - 10.5 K/uL   RBC 4.48 3.87 - 5.11 MIL/uL   Hemoglobin 12.7 12.0 - 15.0 g/dL   HCT 37.3 36.0 - 46.0 %   MCV 83.3 80.0 - 100.0 fL   MCH 28.3 26.0 - 34.0 pg   MCHC 34.0 30.0 - 36.0 g/dL   RDW 14.4 11.5 - 15.5 %   Platelets 222 150 - 400 K/uL   nRBC 0.0 0.0 - 0.2 %   Neutrophils Relative % 63 %   Neutro Abs 4.8 1.7 - 7.7 K/uL   Lymphocytes Relative 24 %   Lymphs Abs 1.8 0.7 - 4.0 K/uL   Monocytes Relative 10 %   Monocytes Absolute 0.7 0.1 - 1.0 K/uL   Eosinophils Relative 2 %   Eosinophils Absolute 0.2 0.0 - 0.5 K/uL   Basophils Relative 1 %   Basophils Absolute 0.1 0.0 - 0.1 K/uL   Immature Granulocytes 0 %   Abs Immature Granulocytes 0.03 0.00 - 0.07 K/uL    Comment: Performed at [facility], [address] [place], [place] [address]    Assessment/Plan:  Benign essential HTN blood pressure control important in reducing the progression of atherosclerotic disease. On appropriate oral medications.   Arteriosclerosis of coronary artery Continue cardiac and antihypertensive medications as already ordered and reviewed, no changes at this time. 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  We discussed that leaving in place would be an option, but obviously it is not being used there is a small risk of infection and thrombosis associated with the catheters and removal would be reasonable.  She is agreeable to go ahead and remove the port and this will be scheduled for the next week or so.      Leotis Pain 03/31/2019, 11:32 AM   This note was created with Dragon medical transcription system.  Any errors from dictation are unintentional.

## 2019-03-31 NOTE — Assessment & Plan Note (Signed)
Continue cardiac and antihypertensive medications as already ordered and reviewed, no changes at this time. Continue statin as ordered and reviewed, no changes at this time Nitrates PRN for chest pain  

## 2019-04-02 ENCOUNTER — Other Ambulatory Visit (INDEPENDENT_AMBULATORY_CARE_PROVIDER_SITE_OTHER): Payer: Self-pay | Admitting: Nurse Practitioner

## 2019-04-03 ENCOUNTER — Other Ambulatory Visit: Payer: Self-pay | Admitting: Internal Medicine

## 2019-04-03 DIAGNOSIS — I10 Essential (primary) hypertension: Secondary | ICD-10-CM

## 2019-04-03 MED ORDER — METOPROLOL SUCCINATE ER 50 MG PO TB24: 50.0000 mg | ORAL_TABLET | Freq: Every day | ORAL | 1 refills | Status: DC

## 2019-04-04 ENCOUNTER — Telehealth (INDEPENDENT_AMBULATORY_CARE_PROVIDER_SITE_OTHER): Payer: 59 | Admitting: Pharmacist

## 2019-04-04 DIAGNOSIS — Z79899 Other long term (current) drug therapy: Secondary | ICD-10-CM

## 2019-04-04 NOTE — Progress Notes (Signed)
   S: Patient is currently taking Dupixent for eczema. Patient is managed by Dr. Nehemiah Massed for this.   Adherence: denies any missed doses  Efficacy: reports that it is working well for her  Dosing: 300 mg every 14 days  Dose adjustments: Renal: no dose adjustments (has not been studied) Hepatic: no dose adjustments (has not been studied)  Screening: TB test: completed per patient  Monitoring: S/sx of infection: denies S/sx of hypersensitivity: denies S/sx of ocular effects: denies S/sx of eosinophilia/vasculitis: denies  O:     Lab Results  Component Value Date   WBC 7.5 03/15/2019   HGB 12.7 03/15/2019   HCT 37.3 03/15/2019   MCV 83.3 03/15/2019   PLT 222 03/15/2019      Chemistry      Component Value Date/Time   NA 137 03/15/2019 1633   NA 139 12/31/2017   NA 139 10/16/2014 0845   K 3.6 03/15/2019 1633   K 3.7 10/16/2014 0845   CL 107 03/15/2019 1633   CL 107 10/16/2014 0845   CO2 23 03/15/2019 1633   CO2 25 10/16/2014 0845   BUN 11 03/15/2019 1633   BUN 13 12/31/2017   BUN 11 10/16/2014 0845   CREATININE 0.72 03/15/2019 1633   CREATININE 0.85 10/16/2014 0845   GLU 91 12/31/2017      Component Value Date/Time   CALCIUM 8.2 (L) 03/15/2019 1633   CALCIUM 8.1 (L) 10/16/2014 0845   ALKPHOS 48 03/15/2019 1633   ALKPHOS 47 10/16/2014 0845   AST 20 03/15/2019 1633   AST 23 10/16/2014 0845   ALT 19 03/15/2019 1633   ALT 18 10/16/2014 0845   BILITOT 0.4 03/15/2019 1633   BILITOT 0.3 10/16/2014 0845       A/P: 1. Medication review: Patient currently on [place] for eczema and is tolerating it well. Reviewed the medication with the patient, including the following: Dupixent is a monoclonal antibody used for the treatment of asthma or atopic dermatitis. Patient educated on purpose, proper use and potential adverse effects of Dupixent. Possible adverse effects include increased risk of infection, ocular effects, vasculitis/eosinophilia, and hypersensitivity  reactions. No recommendations for any changes.   Christella Hartigan, PharmD, BCPS, BCACP, CPP Clinical Pharmacist Practitioner  [phone]

## 2019-04-05 ENCOUNTER — Encounter (INDEPENDENT_AMBULATORY_CARE_PROVIDER_SITE_OTHER): Payer: Self-pay

## 2019-04-07 ENCOUNTER — Other Ambulatory Visit: Payer: Self-pay

## 2019-04-07 ENCOUNTER — Other Ambulatory Visit
Admission: RE | Admit: 2019-04-07 | Discharge: 2019-04-07 | Disposition: A | Discharge status: Home / Self Care | Payer: 59 | Source: Ambulatory Visit | Attending: Vascular Surgery | Admitting: Vascular Surgery

## 2019-04-07 DIAGNOSIS — Z1159 Encounter for screening for other viral diseases: Secondary | ICD-10-CM | POA: Insufficient documentation

## 2019-04-07 DIAGNOSIS — Z01812 Encounter for preprocedural laboratory examination: Secondary | ICD-10-CM | POA: Diagnosis not present

## 2019-04-08 LAB — SARS CORONAVIRUS 2 (TAT 6-24 HRS): SARS Coronavirus 2: NEGATIVE

## 2019-04-10 ENCOUNTER — Other Ambulatory Visit: Payer: Self-pay

## 2019-04-10 ENCOUNTER — Ambulatory Visit
Admission: RE | Admit: 2019-04-10 | Discharge: 2019-04-10 | Disposition: A | Discharge status: Home / Self Care | Payer: 59 | Attending: Vascular Surgery | Admitting: Vascular Surgery

## 2019-04-10 ENCOUNTER — Encounter
Admission: RE | Disposition: A | Discharge status: Home / Self Care | Payer: Self-pay | Source: Home / Self Care | Attending: Vascular Surgery

## 2019-04-10 DIAGNOSIS — Z955 Presence of coronary angioplasty implant and graft: Secondary | ICD-10-CM | POA: Diagnosis not present

## 2019-04-10 DIAGNOSIS — Z452 Encounter for adjustment and management of vascular access device: Secondary | ICD-10-CM | POA: Diagnosis not present

## 2019-04-10 DIAGNOSIS — C189 Malignant neoplasm of colon, unspecified: Secondary | ICD-10-CM

## 2019-04-10 DIAGNOSIS — I2510 Atherosclerotic heart disease of native coronary artery without angina pectoris: Secondary | ICD-10-CM | POA: Diagnosis not present

## 2019-04-10 DIAGNOSIS — Z803 Family history of malignant neoplasm of breast: Secondary | ICD-10-CM | POA: Insufficient documentation

## 2019-04-10 DIAGNOSIS — C8334 Diffuse large B-cell lymphoma, lymph nodes of axilla and upper limb: Secondary | ICD-10-CM | POA: Diagnosis not present

## 2019-04-10 DIAGNOSIS — Z7902 Long term (current) use of antithrombotics/antiplatelets: Secondary | ICD-10-CM | POA: Diagnosis not present

## 2019-04-10 DIAGNOSIS — K219 Gastro-esophageal reflux disease without esophagitis: Secondary | ICD-10-CM | POA: Insufficient documentation

## 2019-04-10 DIAGNOSIS — E785 Hyperlipidemia, unspecified: Secondary | ICD-10-CM | POA: Diagnosis not present

## 2019-04-10 DIAGNOSIS — Z7982 Long term (current) use of aspirin: Secondary | ICD-10-CM | POA: Diagnosis not present

## 2019-04-10 DIAGNOSIS — I10 Essential (primary) hypertension: Secondary | ICD-10-CM | POA: Insufficient documentation

## 2019-04-10 DIAGNOSIS — Z79899 Other long term (current) drug therapy: Secondary | ICD-10-CM | POA: Insufficient documentation

## 2019-04-10 DIAGNOSIS — C8590 Non-Hodgkin lymphoma, unspecified, unspecified site: Secondary | ICD-10-CM | POA: Diagnosis not present

## 2019-04-10 HISTORY — PX: PORTA CATH REMOVAL: CATH118286

## 2019-04-10 SURGERY — PORTA CATH REMOVAL
Anesthesia: Moderate Sedation

## 2019-04-10 MED ORDER — FENTANYL CITRATE (PF) 100 MCG/2ML IJ SOLN
INTRAMUSCULAR | Status: AC
  Filled 2019-04-10: qty 2

## 2019-04-10 MED ORDER — SODIUM CHLORIDE 0.9 % IV SOLN
INTRAVENOUS | Status: DC
  Administered 2019-04-10: 08:00:00 via INTRAVENOUS

## 2019-04-10 MED ORDER — HYDROMORPHONE HCL 1 MG/ML IJ SOLN: 1.0000 mg | Freq: Once | INTRAMUSCULAR | Status: DC | PRN

## 2019-04-10 MED ORDER — MIDAZOLAM HCL 2 MG/ML PO SYRP: 8.0000 mg | ORAL_SOLUTION | Freq: Once | ORAL | Status: DC | PRN

## 2019-04-10 MED ORDER — MIDAZOLAM HCL 2 MG/2ML IJ SOLN
INTRAMUSCULAR | Status: DC | PRN
  Administered 2019-04-10: 2 mg via INTRAVENOUS

## 2019-04-10 MED ORDER — MIDAZOLAM HCL 5 MG/5ML IJ SOLN
INTRAMUSCULAR | Status: AC
  Filled 2019-04-10: qty 5

## 2019-04-10 MED ORDER — FENTANYL CITRATE (PF) 100 MCG/2ML IJ SOLN
INTRAMUSCULAR | Status: DC | PRN
  Administered 2019-04-10: 50 ug via INTRAVENOUS

## 2019-04-10 MED ORDER — CEFAZOLIN SODIUM-DEXTROSE 2-4 GM/100ML-% IV SOLN
2.0000 g | Freq: Once | INTRAVENOUS | Status: AC
  Administered 2019-04-10: 2 g via INTRAVENOUS

## 2019-04-10 MED ORDER — FAMOTIDINE 20 MG PO TABS: 40.0000 mg | ORAL_TABLET | Freq: Once | ORAL | Status: DC | PRN

## 2019-04-10 MED ORDER — DIPHENHYDRAMINE HCL 50 MG/ML IJ SOLN: 50.0000 mg | Freq: Once | INTRAMUSCULAR | Status: DC | PRN

## 2019-04-10 MED ORDER — METHYLPREDNISOLONE SODIUM SUCC 125 MG IJ SOLR: 125.0000 mg | Freq: Once | INTRAMUSCULAR | Status: DC | PRN

## 2019-04-10 MED ORDER — ONDANSETRON HCL 4 MG/2ML IJ SOLN: 4.0000 mg | Freq: Four times a day (QID) | INTRAMUSCULAR | Status: DC | PRN

## 2019-04-10 SURGICAL SUPPLY — 9 items
DERMABOND ADVANCED (GAUZE/BANDAGES/DRESSINGS) ×2
DERMABOND ADVANCED .7 DNX12 (GAUZE/BANDAGES/DRESSINGS) ×1 IMPLANT
PACK ANGIOGRAPHY (CUSTOM PROCEDURE TRAY) ×3 IMPLANT
PENCIL ELECTRO HAND CTR (MISCELLANEOUS) ×3 IMPLANT
SPONGE XRAY 4X4 16PLY STRL (MISCELLANEOUS) ×3 IMPLANT
SUT MNCRL AB 4-0 PS2 18 (SUTURE) ×3 IMPLANT
SUT VIC AB 3-0 SH 27 (SUTURE) ×2
SUT VIC AB 3-0 SH 27X BRD (SUTURE) ×1 IMPLANT
TOWEL OR 17X26 4PK STRL BLUE (TOWEL DISPOSABLE) ×3 IMPLANT

## 2019-04-10 NOTE — Op Note (Signed)
[facility]       Operative Note  Date: 04/10/2019  Preoperative diagnosis:  1. Lymphoma, completed therapy and no longer using port  Postoperative diagnosis:  Same as above  Procedures: #1. Removal of right jugular port a cath   Surgeon: Leotis Pain, MD  Anesthesia: Local with moderate conscious sedation for 15 minutes using 2 mg of Versed and 50 mcg of Fentanyl  Fluoroscopy time: none  Contrast used: 0  Estimated blood loss: Minimal  Indication for the procedure:  The patient is a 68 y.o. adult who has completed therapy for lymphoma and no longer needs their Port-A-Cath. The patient desires to have this removed. Risks and benefits including need for potential replacement with recurrent disease were discussed and patient is agreeable to proceed.  Description of procedure: The patient was brought to the vascular and interventional radiology suite. Moderate conscious sedation was administered during a face to face encounter with the patient throughout the procedure with my supervision of the RN administering medicines and monitoring the patient's vital signs, pulse oximetry, telemetry and mental status throughout from the start of the procedure until the patient was taken to the recovery room.  The right neck chest and shoulder were sterilely prepped and draped, and a sterile surgical field was created. The area was then anesthetized with 1% lidocaine copiously. The previous incision was reopened and electrocautery used to dissected down to the port and the catheter. These were dissected free and the catheter was gently removed from the vein in its entirety. The port was dissected out from the fibrous connective tissue and the Prolene sutures were removed. The port was then removed in its entirety including the catheter. The wound was then closed with a 3-0 Vicryl and a 4-0 Monocryl and Dermabond was placed as a dressing. The  patient was then taken to the recovery room in stable condition having tolerated the procedure well.  Complications: none  Condition: stable   Leotis Pain, MD 04/10/2019 8:45 AM   This note was created with Dragon Medical transcription system. Any errors in dictation are purely unintentional.

## 2019-04-10 NOTE — H&P (Signed)
[facility] History & Physical Update  The patient was interviewed and re-examined.  The patient's previous History and Physical has been reviewed and is unchanged.  There is no change in the plan of care. We plan to proceed with the scheduled procedure.  Leotis Pain, MD  04/10/2019, 8:11 AM

## 2019-04-10 NOTE — Progress Notes (Signed)
Dr. Lucky Cowboy at bedside, speaking with pt. Re: port removal and care after. Pt. Verbalized complete understanding of conversation. Pt. Stable for DC home.

## 2019-04-21 ENCOUNTER — Other Ambulatory Visit: Payer: Self-pay

## 2019-04-21 DIAGNOSIS — Z79890 Hormone replacement therapy (postmenopausal): Secondary | ICD-10-CM

## 2019-04-21 MED ORDER — CEPHALEXIN 500 MG PO CAPS: 500.0000 mg | ORAL_CAPSULE | Freq: Two times a day (BID) | ORAL | 0 refills | Status: DC

## 2019-04-21 MED ORDER — ESTRADIOL 2 MG PO TABS: 2.0000 mg | ORAL_TABLET | Freq: Every day | ORAL | 1 refills | Status: DC

## 2019-05-03 ENCOUNTER — Encounter: Payer: Self-pay | Admitting: Internal Medicine

## 2019-05-03 ENCOUNTER — Ambulatory Visit (INDEPENDENT_AMBULATORY_CARE_PROVIDER_SITE_OTHER): Payer: 59 | Admitting: Internal Medicine

## 2019-05-03 ENCOUNTER — Other Ambulatory Visit: Payer: Self-pay

## 2019-05-03 VITALS — BP 142/100 | HR 82 | Ht 72.0 in | Wt 198.0 lb

## 2019-05-03 DIAGNOSIS — Z1231 Encounter for screening mammogram for malignant neoplasm of breast: Secondary | ICD-10-CM | POA: Diagnosis not present

## 2019-05-03 DIAGNOSIS — I2510 Atherosclerotic heart disease of native coronary artery without angina pectoris: Secondary | ICD-10-CM | POA: Diagnosis not present

## 2019-05-03 DIAGNOSIS — E782 Mixed hyperlipidemia: Secondary | ICD-10-CM

## 2019-05-03 DIAGNOSIS — I10 Essential (primary) hypertension: Secondary | ICD-10-CM | POA: Diagnosis not present

## 2019-05-03 DIAGNOSIS — C8334 Diffuse large B-cell lymphoma, lymph nodes of axilla and upper limb: Secondary | ICD-10-CM

## 2019-05-03 DIAGNOSIS — Z125 Encounter for screening for malignant neoplasm of prostate: Secondary | ICD-10-CM | POA: Diagnosis not present

## 2019-05-03 DIAGNOSIS — Z0000 Encounter for general adult medical examination without abnormal findings: Secondary | ICD-10-CM | POA: Diagnosis not present

## 2019-05-03 MED ORDER — OLMESARTAN MEDOXOMIL-HCTZ 40-12.5 MG PO TABS: 1.0000 | ORAL_TABLET | Freq: Every day | ORAL | 1 refills | Status: DC

## 2019-05-03 NOTE — Progress Notes (Signed)
Date:  05/03/2019   Name:  Kendra Salas   DOB:  1950-12-12   MRN:  [id number]   Chief Complaint: Annual Exam Kendra Salas is a 68 y.o. adult who presents today for her Complete Annual Exam. She feels well. She reports exercising none. She reports she is sleeping fairly well.   Mammogram 12/2017 Colonoscopy 2013 Pneumonia vaccines up to date  Hypertension This is a chronic problem. The problem has been gradually worsening since onset. The problem is uncontrolled. Pertinent negatives include no chest pain, headaches, palpitations or shortness of breath. Past treatments include angiotensin blockers and beta blockers (did not tolerate higher dose of metoprolol). Compliance problems include medication side effects.  Hypertensive end-organ damage includes CAD/MI.  Hyperlipidemia This is a chronic problem. The problem is controlled. Pertinent negatives include no chest pain or shortness of breath. Current antihyperlipidemic treatment includes statins. The current treatment provides significant improvement of lipids.  CAD - doing well with no cardiac symptoms.  BP has been running slightly high. Lymphoma - now 5 years out and in sustained remission.  Recently had port removed with no complications.  Lab Results  Component Value Date   CREATININE 0.72 03/15/2019   BUN 11 03/15/2019   NA 137 03/15/2019   K 3.6 03/15/2019   CL 107 03/15/2019   CO2 23 03/15/2019   Lab Results  Component Value Date   CALCIUM 8.2 (L) 03/15/2019   Lab Results  Component Value Date   CHOL 129 12/31/2017   HDL 36 12/31/2017   LDLCALC 71 12/31/2017   TRIG 109 12/31/2017   CHOLHDL 3.4 07/27/2017   Lab Results  Component Value Date   WBC 7.5 03/15/2019   HGB 12.7 03/15/2019   HCT 37.3 03/15/2019   MCV 83.3 03/15/2019   PLT 222 03/15/2019     Review of Systems  Constitutional: Negative for appetite change, fatigue, fever and unexpected weight change.  HENT: Negative for tinnitus and trouble  swallowing.   Eyes: Negative for visual disturbance.  Respiratory: Negative for cough, chest tightness and shortness of breath.   Cardiovascular: Positive for leg swelling (mild ankle edema). Negative for chest pain and palpitations.  Gastrointestinal: Negative for abdominal pain.  Endocrine: Negative for polydipsia and polyuria.  Genitourinary: Negative for dysuria and hematuria.  Musculoskeletal: Negative for arthralgias.  Skin: Positive for rash (psoriasis much improved with Dupixent). Negative for color change.  Neurological: Negative for dizziness, tremors, numbness and headaches.  Psychiatric/Behavioral: Negative for dysphoric mood and sleep disturbance. The patient is not nervous/anxious.     Patient Active Problem List   Diagnosis Date Noted  . Port-A-Cath in place 03/26/2019  . Psoriasis 03/30/2018  . Mixed hyperlipidemia 03/31/2016  . Breathlessness on exertion 03/31/2016  . Special screening for malignant neoplasms, colon   . Benign neoplasm of ascending colon   . Arteriosclerosis of coronary artery 03/03/2015  . Diverticulosis 03/03/2015  . Benign essential HTN 03/03/2015  . 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 AUGMENTATION MAMMAPLASTY Bilateral 2003  . CARDIAC CATHETERIZATION     X2 - has 5 stents. 4 LAD, 1 posterior, Last 3 placed 2004  . COLONOSCOPY  06/29/2012   repeat 10 yrs - Dr. Candace Cruise  . ESOPHAGOGASTRODUODENOSCOPY (EGD) WITH PROPOFOL N/A 11/22/2015   Procedure: ESOPHAGOGASTRODUODENOSCOPY (EGD) WITH PROPOFOL;  Surgeon: Lucilla Lame, MD;  Location: [facility];  Service: Endoscopy;  Laterality: N/A;  . FACIAL COSMETIC SURGERY    . gender re-affirmation    . HERNIA REPAIR     x2  . PORTA CATH REMOVAL N/A 04/10/2019   Procedure: PORTA CATH REMOVAL;  Surgeon: Algernon Huxley, MD;  Location: [facility];  Service: Cardiovascular;  Laterality:  N/A;    Social History   Tobacco Use  . Smoking status: Never Smoker  . Smokeless tobacco: Never Used  Substance Use Topics  . Alcohol use: Yes    Alcohol/week: 1.0 standard drinks    Types: 1 Shots of liquor per week  . Drug use: Never     Medication list has been reviewed and updated.  Current Meds  Medication Sig  . aspirin 81 MG tablet Take 81 mg by mouth daily.  . clopidogrel (PLAVIX) 75 MG tablet Take 1 tablet (75 mg total) by mouth daily.  Kendra Salas (EUCRISA) 2 % OINT Apply 1 application topically 2 (two) times a day.  . Dupilumab (DUPIXENT) 300 MG/2ML SOSY Inject 300 mg into the skin every 14 (fourteen) days. Inject 1 syringe under the skin every 2 weeks  . estradiol (ESTRACE) 2 MG tablet Take 1 tablet (2 mg total) by mouth daily.  . finasteride (PROSCAR) 5 MG tablet Take 1 tablet (5 mg total) by mouth daily.  . hydrOXYzine (ATARAX/VISTARIL) 10 MG tablet Take 1 tablet (10 mg total) by mouth every 6 (six) hours as needed for itching.  . hyoscyamine (ANASPAZ) 0.125 MG TBDP disintergrating tablet Place 1 tablet (0.125 mg total) under the tongue every 6 (six) hours as needed.  . loratadine (CLARITIN) 10 MG tablet Take 1 tablet (10 mg total) by mouth daily.  Marland Kitchen losartan (COZAAR) 100 MG tablet Take 1 tablet (100 mg total) by mouth daily.  . metoprolol succinate (TOPROL-XL) 50 MG 24 hr tablet Take 1 tablet (50 mg total) by mouth daily.  . ondansetron (ZOFRAN) 4 MG tablet Take 1 tablet (4 mg total) by mouth every 8 (eight) hours as needed for nausea or vomiting.  . pantoprazole (PROTONIX) 40 MG tablet Take 1 tablet (40 mg total) by mouth daily.  . promethazine (PHENERGAN) 25 MG tablet Take 25 mg by mouth every 6 (six) hours as needed.   . rosuvastatin (CRESTOR) 20 MG tablet Take 1 tablet (20 mg total) by mouth daily.  Marland Kitchen triamcinolone cream (KENALOG) 0.1 % Apply 1 application topically 2 (two) times daily.    PHQ 2/9 Scores 05/03/2019 03/30/2018  PHQ - 2 Score 0 0  Exception  Documentation - Patient refusal    BP Readings from Last 3 Encounters:  05/03/19 (!) 142/100  04/10/19 124/81  03/31/19 (!) 151/95    Physical Exam Vitals signs and nursing note reviewed.  Constitutional:      Appearance: Normal appearance. She is well-developed.  HENT:     Head: Normocephalic.     Right Ear: Tympanic membrane, ear canal and external ear normal.     Left Ear: Tympanic membrane, ear canal and external ear normal.     Nose: Nose normal.     Mouth/Throat:     Mouth: Mucous membranes are moist.     Pharynx: Uvula midline. No posterior oropharyngeal erythema.  Eyes:     Conjunctiva/sclera: Conjunctivae normal.     Pupils: Pupils are equal, round, and reactive to light.  Neck:     Musculoskeletal: Normal range of motion and neck supple.     Thyroid: No thyromegaly.     Vascular: No carotid  bruit.  Cardiovascular:     Rate and Rhythm: Normal rate and regular rhythm.     Heart sounds: Normal heart sounds. No murmur.  Pulmonary:     Effort: Pulmonary effort is normal.     Breath sounds: Normal breath sounds. No wheezing or rhonchi.  Chest:     Breasts:        Right: No mass, nipple discharge, skin change or tenderness.        Left: No mass, nipple discharge, skin change or tenderness.     Comments: Implants soft, mobile, non tender Abdominal:     General: Bowel sounds are normal.     Palpations: Abdomen is soft.     Tenderness: There is no abdominal tenderness.  Genitourinary:    Prostate: Normal. Not tender and no nodules present.     Rectum: Normal.  Musculoskeletal: Normal range of motion.        General: Swelling present.     Right lower leg: No edema.     Left lower leg: No edema.  Lymphadenopathy:     Cervical: No cervical adenopathy.  Skin:    General: Skin is warm and dry.     Capillary Refill: Capillary refill takes less than 2 seconds.  Neurological:     General: No focal deficit present.     Mental Status: She is alert and oriented to  person, place, and time.     Sensory: Sensation is intact.     Motor: Motor function is intact.     Coordination: Coordination is intact.     Deep Tendon Reflexes: Reflexes are normal and symmetric.  Psychiatric:        Speech: Speech normal.        Behavior: Behavior normal.        Thought Content: Thought content normal.        Judgment: Judgment normal.     Wt Readings from Last 3 Encounters:  05/03/19 198 lb (89.8 kg)  04/10/19 200 lb (90.7 kg)  03/31/19 202 lb (91.6 kg)    BP (!) 142/100   Pulse 82   Ht 6' (1.829 m)   Wt 198 lb (89.8 kg)   SpO2 94%   BMI 26.85 kg/m   Assessment and Plan: 1. Annual physical exam Normal exam Continue healthy diet  2. Encounter for screening mammogram for breast cancer To be scheduled at [place]; Future  3. Benign essential HTN Stop losartan Add Benicar hct - olmesartan-hydrochlorothiazide (BENICAR HCT) 40-12.5 MG tablet; Take 1 tablet by mouth daily.  Dispense: 90 tablet; Refill: 1 - Comprehensive metabolic panel - TSH  4. Arteriosclerosis of coronary artery Stable, followed by Cardiology Continue Plavix and ASA  5. Mixed hyperlipidemia On Crestor - Comprehensive metabolic panel - Lipid panel  6. Prostate cancer screening Normal exam - PSA  7. 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 Any errors are unintentional.  Halina Maidens, MD [facility]  05/03/2019

## 2019-05-04 ENCOUNTER — Other Ambulatory Visit: Payer: Self-pay | Admitting: Pharmacist

## 2019-05-04 ENCOUNTER — Other Ambulatory Visit: Payer: Self-pay | Admitting: Internal Medicine

## 2019-05-04 DIAGNOSIS — I10 Essential (primary) hypertension: Secondary | ICD-10-CM | POA: Diagnosis not present

## 2019-05-04 DIAGNOSIS — E782 Mixed hyperlipidemia: Secondary | ICD-10-CM | POA: Diagnosis not present

## 2019-05-04 MED ORDER — DUPIXENT 300 MG/2ML ~~LOC~~ SOSY: 300.0000 mg | PREFILLED_SYRINGE | SUBCUTANEOUS | 2 refills | Status: DC

## 2019-05-05 LAB — COMPREHENSIVE METABOLIC PANEL
ALT: 17 IU/L (ref 0–32)
AST: 22 IU/L (ref 0–40)
Albumin/Globulin Ratio: 1.6 (ref 1.2–2.2)
Albumin: 4.2 g/dL (ref 3.8–4.8)
Alkaline Phosphatase: 51 IU/L (ref 39–117)
BUN/Creatinine Ratio: 12 (ref 12–28)
BUN: 10 mg/dL (ref 8–27)
Bilirubin Total: 0.5 mg/dL (ref 0.0–1.2)
CO2: 21 mmol/L (ref 20–29)
Calcium: 8.9 mg/dL (ref 8.7–10.3)
Chloride: 105 mmol/L (ref 96–106)
Creatinine, Ser: 0.84 mg/dL (ref 0.57–1.00)
GFR calc Af Amer: 83 mL/min/{1.73_m2} (ref >59)
GFR calc non Af Amer: 72 mL/min/{1.73_m2} (ref >59)
Globulin, Total: 2.6 g/dL (ref 1.5–4.5)
Glucose: 85 mg/dL (ref 65–99)
Potassium: 3.9 mmol/L (ref 3.5–5.2)
Sodium: 140 mmol/L (ref 134–144)
Total Protein: 6.8 g/dL (ref 6.0–8.5)

## 2019-05-05 LAB — LIPID PANEL
Chol/HDL Ratio: 3.2 ratio (ref 0.0–4.4)
Cholesterol, Total: 125 mg/dL (ref 100–199)
HDL: 39 mg/dL — ABNORMAL LOW (ref >39)
LDL Calculated: 66 mg/dL (ref 0–99)
Triglycerides: 98 mg/dL (ref 0–149)
VLDL Cholesterol Cal: 20 mg/dL (ref 5–40)

## 2019-05-05 LAB — TSH: TSH: 2.71 u[IU]/mL (ref 0.450–4.500)

## 2019-05-05 LAB — PSA: Prostate Specific Ag, Serum: <0.1 ng/mL

## 2019-05-05 MED FILL — DUPIXENT 300 MG/2 ML SAFE S: 300 | 28 days supply | Qty: 4 | Fill #0

## 2019-05-16 ENCOUNTER — Other Ambulatory Visit: Payer: Self-pay

## 2019-05-16 DIAGNOSIS — I25118 Atherosclerotic heart disease of native coronary artery with other forms of angina pectoris: Secondary | ICD-10-CM

## 2019-05-16 DIAGNOSIS — E782 Mixed hyperlipidemia: Secondary | ICD-10-CM

## 2019-05-16 DIAGNOSIS — Z79890 Hormone replacement therapy (postmenopausal): Secondary | ICD-10-CM

## 2019-05-16 DIAGNOSIS — K219 Gastro-esophageal reflux disease without esophagitis: Secondary | ICD-10-CM

## 2019-05-16 MED ORDER — ROSUVASTATIN CALCIUM 20 MG PO TABS: 20.0000 mg | ORAL_TABLET | Freq: Every day | ORAL | 1 refills | Status: DC

## 2019-05-16 MED ORDER — PANTOPRAZOLE SODIUM 40 MG PO TBEC: 40.0000 mg | DELAYED_RELEASE_TABLET | Freq: Every day | ORAL | 1 refills | Status: DC

## 2019-05-16 MED ORDER — CLOPIDOGREL BISULFATE 75 MG PO TABS: 75.0000 mg | ORAL_TABLET | Freq: Every day | ORAL | 1 refills | Status: DC

## 2019-05-16 MED ORDER — FINASTERIDE 5 MG PO TABS: 5.0000 mg | ORAL_TABLET | Freq: Every day | ORAL | 1 refills | Status: DC

## 2019-05-19 ENCOUNTER — Other Ambulatory Visit: Payer: Self-pay

## 2019-05-19 NOTE — Progress Notes (Unsigned)
138/86 on irbesartan today

## 2019-05-25 ENCOUNTER — Other Ambulatory Visit: Payer: Self-pay | Admitting: Internal Medicine

## 2019-05-25 DIAGNOSIS — B029 Zoster without complications: Secondary | ICD-10-CM

## 2019-05-25 MED ORDER — ONDANSETRON HCL 4 MG PO TABS: 4.0000 mg | ORAL_TABLET | Freq: Three times a day (TID) | ORAL | 0 refills | Status: DC | PRN

## 2019-05-25 MED ORDER — VALACYCLOVIR HCL 1 G PO TABS: 1000.0000 mg | ORAL_TABLET | Freq: Three times a day (TID) | ORAL | 0 refills | Status: AC

## 2019-05-26 ENCOUNTER — Other Ambulatory Visit: Payer: Self-pay | Admitting: Internal Medicine

## 2019-05-26 DIAGNOSIS — B029 Zoster without complications: Secondary | ICD-10-CM

## 2019-05-26 MED ORDER — GABAPENTIN 100 MG PO CAPS: 100.0000 mg | ORAL_CAPSULE | Freq: Three times a day (TID) | ORAL | 0 refills | Status: DC

## 2019-06-06 MED FILL — DUPIXENT 300 MG/2 ML SAFE S: 300 | 28 days supply | Qty: 4 | Fill #1

## 2019-07-10 MED FILL — DUPIXENT 300 MG/2 ML SAFE S: 300 | 28 days supply | Qty: 4 | Fill #2

## 2019-08-22 ENCOUNTER — Other Ambulatory Visit: Payer: Self-pay | Admitting: Pharmacist

## 2019-08-22 MED ORDER — DUPIXENT 300 MG/2ML ~~LOC~~ SOSY: 300.0000 mg | PREFILLED_SYRINGE | SUBCUTANEOUS | 0 refills | Status: DC

## 2019-09-20 ENCOUNTER — Other Ambulatory Visit: Payer: Self-pay | Admitting: Pharmacist

## 2019-09-20 DIAGNOSIS — D2230 Melanocytic nevi of unspecified part of face: Secondary | ICD-10-CM | POA: Diagnosis not present

## 2019-09-20 DIAGNOSIS — D225 Melanocytic nevi of trunk: Secondary | ICD-10-CM | POA: Diagnosis not present

## 2019-09-20 DIAGNOSIS — L820 Inflamed seborrheic keratosis: Secondary | ICD-10-CM | POA: Diagnosis not present

## 2019-09-20 DIAGNOSIS — L814 Other melanin hyperpigmentation: Secondary | ICD-10-CM | POA: Diagnosis not present

## 2019-09-20 DIAGNOSIS — L821 Other seborrheic keratosis: Secondary | ICD-10-CM | POA: Diagnosis not present

## 2019-09-20 DIAGNOSIS — Z1283 Encounter for screening for malignant neoplasm of skin: Secondary | ICD-10-CM | POA: Diagnosis not present

## 2019-09-20 DIAGNOSIS — L209 Atopic dermatitis, unspecified: Secondary | ICD-10-CM | POA: Diagnosis not present

## 2019-09-20 DIAGNOSIS — D229 Melanocytic nevi, unspecified: Secondary | ICD-10-CM | POA: Diagnosis not present

## 2019-09-20 DIAGNOSIS — D1801 Hemangioma of skin and subcutaneous tissue: Secondary | ICD-10-CM | POA: Diagnosis not present

## 2019-09-20 MED ORDER — DUPIXENT 300 MG/2ML ~~LOC~~ SOSY: 300.0000 mg | PREFILLED_SYRINGE | SUBCUTANEOUS | 5 refills | Status: DC

## 2019-10-09 ENCOUNTER — Other Ambulatory Visit: Payer: Self-pay | Admitting: Pharmacist

## 2019-10-09 MED ORDER — DUPIXENT 300 MG/2ML ~~LOC~~ SOSY: 300.0000 mg | PREFILLED_SYRINGE | SUBCUTANEOUS | 5 refills | Status: DC

## 2019-10-09 MED FILL — DUPIXENT 300 MG/2 ML SAFE S: 300 | 28 days supply | Qty: 4 | Fill #0

## 2019-11-15 MED FILL — DUPIXENT 300 MG/2 ML SAFE S: 300 | 28 days supply | Qty: 4 | Fill #1

## 2019-12-12 MED FILL — DUPIXENT 300 MG/2 ML SAFE S: 300 | 28 days supply | Qty: 4 | Fill #2

## 2019-12-13 ENCOUNTER — Other Ambulatory Visit: Payer: Self-pay | Admitting: Internal Medicine

## 2019-12-13 DIAGNOSIS — Z79890 Hormone replacement therapy (postmenopausal): Secondary | ICD-10-CM

## 2019-12-13 DIAGNOSIS — I10 Essential (primary) hypertension: Secondary | ICD-10-CM

## 2019-12-13 DIAGNOSIS — I25118 Atherosclerotic heart disease of native coronary artery with other forms of angina pectoris: Secondary | ICD-10-CM

## 2019-12-13 DIAGNOSIS — E782 Mixed hyperlipidemia: Secondary | ICD-10-CM

## 2019-12-13 DIAGNOSIS — K219 Gastro-esophageal reflux disease without esophagitis: Secondary | ICD-10-CM

## 2019-12-21 ENCOUNTER — Other Ambulatory Visit: Payer: Self-pay | Admitting: Internal Medicine

## 2019-12-21 DIAGNOSIS — N3001 Acute cystitis with hematuria: Secondary | ICD-10-CM

## 2019-12-21 MED ORDER — CIPROFLOXACIN HCL 250 MG PO TABS: 250.0000 mg | ORAL_TABLET | Freq: Two times a day (BID) | ORAL | 0 refills | Status: AC

## 2020-01-03 LAB — LIPID PANEL
Cholesterol: 152 (ref 0–200)
HDL: 49 (ref 35–70)
LDL Cholesterol: 89
Triglycerides: 100 (ref 40–160)

## 2020-01-03 LAB — BASIC METABOLIC PANEL
BUN: 12 (ref 4–21)
Creatinine: 0.9
Glucose: 90
Potassium: 4.1 (ref 3.4–5.3)
Sodium: 139 (ref 137–147)

## 2020-01-03 LAB — TSH: TSH: 2.4 (ref 0.41–5.90)

## 2020-01-03 LAB — CBC AND DIFFERENTIAL
HCT: 43 (ref 39–52)
Hemoglobin: 13.9 (ref 13.0–17.0)
Platelets: 259 (ref 150–399)
WBC: 7

## 2020-01-03 LAB — HEPATIC FUNCTION PANEL
ALT: 16
AST: 18

## 2020-01-03 LAB — PSA: PSA: 0.1

## 2020-01-11 MED FILL — DUPIXENT 300 MG/2 ML SAFE S: 300 | 28 days supply | Qty: 4 | Fill #3

## 2020-02-08 MED FILL — DUPIXENT 300 MG/2 ML SAFE S: 300 | 28 days supply | Qty: 4 | Fill #4

## 2020-03-15 ENCOUNTER — Ambulatory Visit: Payer: 59 | Admitting: Hematology and Oncology

## 2020-03-15 ENCOUNTER — Other Ambulatory Visit: Payer: 59

## 2020-03-19 ENCOUNTER — Other Ambulatory Visit: Payer: Self-pay

## 2020-03-19 DIAGNOSIS — C8334 Diffuse large B-cell lymphoma, lymph nodes of axilla and upper limb: Secondary | ICD-10-CM

## 2020-03-20 ENCOUNTER — Encounter: Payer: Self-pay | Admitting: Dermatology

## 2020-03-20 ENCOUNTER — Ambulatory Visit: Payer: 59 | Admitting: Dermatology

## 2020-03-20 ENCOUNTER — Other Ambulatory Visit: Payer: Self-pay

## 2020-03-20 DIAGNOSIS — Z85820 Personal history of malignant melanoma of skin: Secondary | ICD-10-CM | POA: Diagnosis not present

## 2020-03-20 DIAGNOSIS — Z85828 Personal history of other malignant neoplasm of skin: Secondary | ICD-10-CM | POA: Diagnosis not present

## 2020-03-20 DIAGNOSIS — L578 Other skin changes due to chronic exposure to nonionizing radiation: Secondary | ICD-10-CM | POA: Diagnosis not present

## 2020-03-20 DIAGNOSIS — L2089 Other atopic dermatitis: Secondary | ICD-10-CM

## 2020-03-20 DIAGNOSIS — L814 Other melanin hyperpigmentation: Secondary | ICD-10-CM

## 2020-03-20 DIAGNOSIS — D1801 Hemangioma of skin and subcutaneous tissue: Secondary | ICD-10-CM

## 2020-03-20 DIAGNOSIS — D229 Melanocytic nevi, unspecified: Secondary | ICD-10-CM

## 2020-03-20 DIAGNOSIS — Z1283 Encounter for screening for malignant neoplasm of skin: Secondary | ICD-10-CM

## 2020-03-20 DIAGNOSIS — Z86018 Personal history of other benign neoplasm: Secondary | ICD-10-CM

## 2020-03-20 DIAGNOSIS — L821 Other seborrheic keratosis: Secondary | ICD-10-CM

## 2020-03-20 NOTE — Progress Notes (Signed)
   Follow-Up Visit   Subjective  Kendra Salas is a 69 y.o. adult who presents for the following: Annual Exam (History of Melanoma in situ, Dysplastic nevi, BCC). The patient presents for Total-Body Skin Exam (TBSE) for skin cancer screening and mole check. Pt also on Dupixent for Severe Atopic Neurodermatitis - improved on Dupixent without side effects  The following portions of the chart were reviewed this encounter and updated as appropriate:  Tobacco  Allergies  Meds  Problems  Med Hx  Surg Hx  Fam Hx      Review of Systems:  No other skin or systemic complaints except as noted in HPI or Assessment and Plan.  Objective  Well appearing patient in no apparent distress; mood and affect are within normal limits.  A full examination was performed including scalp, head, eyes, ears, nose, lips, neck, chest, axillae, abdomen, back, buttocks, bilateral upper extremities, bilateral lower extremities, hands, feet, fingers, toes, fingernails, and toenails. All findings within normal limits unless otherwise noted below.  Objective  Right medial breast: Well healed excision site.  Objective  Left Upper Arm - Anterior: Minimal pinkness and excoriations of arms and buttocks.   Assessment & Plan    Lentigines - Scattered tan macules - Discussed due to sun exposure - Benign, observe - Call for any changes  Seborrheic Keratoses - Stuck-on, waxy, tan-brown papules and plaques  - Discussed benign etiology and prognosis. - Observe - Call for any changes  Melanocytic Nevi - Tan-brown and/or pink-flesh-colored symmetric macules and papules - Benign appearing on exam today - Observation - Call clinic for new or changing moles - Recommend daily use of broad spectrum spf 30+ sunscreen to sun-exposed areas.   Hemangiomas - Red papules - Discussed benign nature - Observe - Call for any changes  Actinic Damage - diffuse scaly erythematous macules with underlying  dyspigmentation - Recommend daily broad spectrum sunscreen SPF 30+ to sun-exposed areas, reapply every 2 hours as needed.  - Call for new or changing lesions.  Skin cancer screening performed today.  History of Basal Cell Carcinoma of the Skin - No evidence of recurrence today - Recommend regular full body skin exams - Recommend daily broad spectrum sunscreen SPF 30+ to sun-exposed areas, reapply every 2 hours as needed.  - Call if any new or changing lesions are noted between office visits.  History of Dysplastic Nevi - No evidence of recurrence today - Recommend regular full body skin exams - Recommend daily broad spectrum sunscreen SPF 30+ to sun-exposed areas, reapply every 2 hours as needed.  - Call if any new or changing lesions are noted between office visits  History of melanoma Right medial breast  History of Melanoma in situ  Clear today. Observe  Other atopic dermatitis - Severe - but improved on systemic Dupixent - no side effects.  Some persistent areas. Left Upper Arm - Anterior  Severe but well controlled.  Continue Dupixent as prescribed.  Continue Eucrisa qd prn and TMC 0.1% oint prn flares.  Return in about 6 months (around 09/19/2020) for check sun exposed areas and atopic dermatitis.  I, Ashok Cordia, CMA, am acting as scribe for Sarina Ser, MD .  Documentation: I have reviewed the above documentation for accuracy and completeness, and I agree with the above.  Sarina Ser, MD

## 2020-03-24 ENCOUNTER — Encounter: Payer: Self-pay | Admitting: Dermatology

## 2020-03-25 ENCOUNTER — Telehealth: Payer: Self-pay | Admitting: Pharmacist

## 2020-03-25 ENCOUNTER — Inpatient Hospital Stay: Payer: 59 | Admitting: Hematology and Oncology

## 2020-03-25 ENCOUNTER — Other Ambulatory Visit: Payer: 59

## 2020-03-25 NOTE — Telephone Encounter (Signed)
Called patient to schedule an appointment for the [facility]. I was unable to reach the patient so I left a HIPAA-compliant message requesting that the patient return my call.   

## 2020-03-26 NOTE — Progress Notes (Signed)
[facility]  [address], [address] [place], [place] [address] Phone: [phone]  Fax: [phone]   Clinic Day:  03/27/2020  Referring physician: Glean Hess, MD  Chief Complaint: Kendra Salas is a 69 y.o. adult with stage IIIB diffuse large B-cell lymphoma who is seen for a 1 year assessment.   HPI:  The patient was last seen in the medical oncology clinic as a new patient on 03/15/2019.  She denied any B symptoms. Exam revealed no adenopathy or hepatosplenomegaly.  Hematocrit was 37.3, hemoglobin 12.7, platelets 222,000, WBC 7,500. Calcium was 8.2. Uric acid was 4.0. LDH was 125. Surveillance continued.   She was seen in consultation by Dr. Lucky Cowboy on 03/31/2019.  She was doing well.  Patient requested port removed. She had no concerns. Patient underwent port removal on 04/10/2019.   She was seen by Dr Nehemiah Massed at [facility] on 03/20/2020. Exam was stable.  Patient will follow up on 09/19/2020.   During the interim, she has been doing well.  She denies B symptoms, lumps, bumps, or breathing problems. Trouble swallowing has improved with pantoprazole. Left mandible pain has resolved.  The patient received the [place] COVID-19 vaccines in 09/2019 and 10/2019.   Past Medical History:  Diagnosis Date  . Anxiety   . Basal cell carcinoma 03/07/2008   left dorsum mid forearm  . Basal cell carcinoma 04/19/2013   left distal lat ant thigh  . Basal cell carcinoma 04/11/2014   right forearm, right post shoulder near deltoid  . Basal cell carcinoma 10/12/2018   left mid back 7.0 cm lat to spine  . CAD (coronary artery disease)   . Clotting disorder ([place])   . Dysplastic nevus 02/02/2007   left volar mid to prox forearm  . Dysplastic nevus 07/09/2010   right medial breast 1.5 cm med to areola, right med breast parasternal, right post distal deltoid  . Eczema   . GERD (gastroesophageal reflux disease)   . Hyperlipemia   . Hypertension   .  Lymphoma ([place])   . Melanoma in situ ([place]) 12/22/2006   right medial breast, med sup quad  . Personal history of chemotherapy     Past Surgical History:  Procedure Laterality Date  . AUGMENTATION MAMMAPLASTY Bilateral 2003  . CARDIAC CATHETERIZATION     X2 - has 5 stents. 4 LAD, 1 posterior, Last 3 placed 2004  . COLONOSCOPY  06/29/2012   repeat 10 yrs - Dr. Candace Cruise  . ESOPHAGOGASTRODUODENOSCOPY (EGD) WITH PROPOFOL N/A 11/22/2015   Procedure: ESOPHAGOGASTRODUODENOSCOPY (EGD) WITH PROPOFOL;  Surgeon: Lucilla Lame, MD;  Location: [place];  Service: Endoscopy;  Laterality: N/A;  . FACIAL COSMETIC SURGERY    . gender re-affirmation    . HERNIA REPAIR     x2  . PORTA CATH REMOVAL N/A 04/10/2019   Procedure: PORTA CATH REMOVAL;  Surgeon: Algernon Huxley, MD;  Location: [facility];  Service: Cardiovascular;  Laterality: N/A;    Family History  Problem Relation Age of Onset  . Breast cancer Maternal Aunt 64    Social History:  reports that she has never smoked. She has never used smokeless tobacco. She reports current alcohol use of about 1.0 standard drink of alcohol per week. She reports that she does not use drugs.  She lives in [place] with her daughter, and has since 60. She is a physician. The patient is alone today.  Allergies: No Known Allergies  Current Medications: Current Outpatient Medications  Medication Sig Dispense Refill  . aspirin 81 MG tablet Take 81 mg by mouth daily.    . clopidogrel (PLAVIX) 75 MG tablet TAKE 1 TABLET BY MOUTH DAILY. 90 tablet 1  . dupilumab (DUPIXENT) 300 MG/2ML prefilled syringe Inject 300 mg into the skin every 14 (fourteen) days. Inject 1 syringe under the skin every 2 weeks 4 mL 5  . estradiol (ESTRACE) 2 MG tablet TAKE 1 TABLET BY MOUTH DAILY. 90 tablet 1  . finasteride (PROSCAR) 5 MG tablet TAKE 1 TABLET BY MOUTH DAILY. 90 tablet 1  . hyoscyamine (ANASPAZ) 0.125 MG TBDP disintergrating tablet Place 1 tablet (0.125 mg total) under  the tongue every 6 (six) hours as needed. 30 tablet 0  . losartan (COZAAR) 100 MG tablet TAKE 1 TABLET (100 MG TOTAL) BY MOUTH DAILY. 90 tablet 1  . ondansetron (ZOFRAN) 4 MG tablet Take 1 tablet (4 mg total) by mouth every 8 (eight) hours as needed for nausea or vomiting. 12 tablet 0  . ondansetron (ZOFRAN) 4 MG tablet Take 1 tablet (4 mg total) by mouth every 8 (eight) hours as needed for nausea or vomiting. 20 tablet 0  . pantoprazole (PROTONIX) 40 MG tablet TAKE 1 TABLET BY MOUTH DAILY. 90 tablet 1  . rosuvastatin (CRESTOR) 20 MG tablet TAKE 1 TABLET (20 MG TOTAL) BY MOUTH DAILY. 90 tablet 1  . triamcinolone cream (KENALOG) 0.1 % Apply 1 application topically 2 (two) times daily. 30 g 0  . Crisaborole (EUCRISA) 2 % OINT Apply 1 application topically 2 (two) times a day. (Patient not taking: Reported on 03/27/2020) 100 g 2  . gabapentin (NEURONTIN) 100 MG capsule Take 1-3 capsules (100-300 mg total) by mouth 3 (three) times daily. (Patient not taking: Reported on 03/27/2020) 270 capsule 0  . hydrOXYzine (ATARAX/VISTARIL) 10 MG tablet Take 1 tablet (10 mg total) by mouth every 6 (six) hours as needed for itching. (Patient not taking: Reported on 03/27/2020) 90 tablet 3  . loratadine (CLARITIN) 10 MG tablet Take 1 tablet (10 mg total) by mouth daily. (Patient not taking: Reported on 03/27/2020) 30 tablet 11  . metoprolol succinate (TOPROL-XL) 50 MG 24 hr tablet Take 1 tablet (50 mg total) by mouth daily. (Patient not taking: Reported on 03/27/2020) 90 tablet 1  . olmesartan-hydrochlorothiazide (BENICAR HCT) 40-12.5 MG tablet Take 1 tablet by mouth daily. (Patient not taking: Reported on 03/27/2020) 90 tablet 1  . promethazine (PHENERGAN) 25 MG tablet Take 25 mg by mouth every 6 (six) hours as needed.  (Patient not taking: Reported on 03/27/2020)     No current facility-administered medications for this visit.   Facility-Administered Medications Ordered in Other Visits  Medication Dose Route Frequency  Provider Last Rate Last Admin  . heparin lock flush 100 unit/mL  500 Units Intravenous Once Choksi, Janak, MD      . sodium chloride 0.9 % injection 10 mL  10 mL Intravenous PRN Choksi, Delorise Shiner, MD        Review of Systems  Constitutional: Negative for chills, diaphoresis, fever, malaise/fatigue and weight loss.       Doing well.  HENT: Negative for congestion, ear discharge, ear pain, hearing loss, nosebleeds, sinus pain, sore throat and tinnitus.   Eyes: Negative for blurred vision.  Respiratory: Negative for cough, sputum production and shortness of breath.   Cardiovascular: Negative for chest pain, palpitations, orthopnea, claudication and PND.  Gastrointestinal: Negative for abdominal pain, blood in stool, constipation, diarrhea, heartburn, melena, nausea and vomiting.  Trouble swallowing improved with pantoprazole.  Genitourinary: Negative for dysuria, frequency, hematuria and urgency.  Musculoskeletal: Negative for back pain, joint pain, myalgias and neck pain.  Skin: Negative for rash.  Neurological: Negative for dizziness, tingling, sensory change, weakness and headaches.  Endo/Heme/Allergies: Does not bruise/bleed easily.  Psychiatric/Behavioral: Negative for depression and memory loss. The patient is not nervous/anxious and does not have insomnia.   All other systems reviewed and are negative.  Performance status (ECOG): 0  Vitals: Blood pressure (!) 144/93, pulse 82, temperature 98.4 F (36.9 C), temperature source Tympanic, resp. rate 18, height 6' (1.829 m), weight 200 lb 15.2 oz (91.2 kg), SpO2 98 %.  Physical Exam Vitals and nursing note reviewed.  Constitutional:      General: She is not in acute distress.    Appearance: She is well-developed. She is not diaphoretic.  HENT:     Head: Normocephalic and atraumatic.     Comments: Long blonde hair.  Mask.    Mouth/Throat:     Mouth: Mucous membranes are moist.     Pharynx: Oropharynx is clear. No oropharyngeal  exudate.  Eyes:     General: No scleral icterus.    Extraocular Movements: Extraocular movements intact.     Conjunctiva/sclera: Conjunctivae normal.     Pupils: Pupils are equal, round, and reactive to light.     Comments: Blue eyes.  Cardiovascular:     Rate and Rhythm: Normal rate and regular rhythm.     Pulses: Normal pulses.     Heart sounds: Normal heart sounds. No murmur heard.   Pulmonary:     Effort: Pulmonary effort is normal. No respiratory distress.     Breath sounds: Normal breath sounds. No wheezing.  Abdominal:     General: Bowel sounds are normal. There is no distension.     Palpations: Abdomen is soft. There is no mass.     Tenderness: There is no abdominal tenderness. There is no guarding or rebound.  Musculoskeletal:        General: No swelling or tenderness. Normal range of motion.     Cervical back: Normal range of motion and neck supple.  Lymphadenopathy:     Head:     Right side of head: No preauricular, posterior auricular or occipital adenopathy.     Left side of head: No preauricular, posterior auricular or occipital adenopathy.     Cervical: No cervical adenopathy.     Upper Body:     Right upper body: No supraclavicular or axillary adenopathy.     Left upper body: No supraclavicular or axillary adenopathy.     Lower Body: No right inguinal adenopathy. No left inguinal adenopathy.  Skin:    General: Skin is warm and dry.     Findings: No bruising, erythema or rash.  Neurological:     Mental Status: She is alert and oriented to person, place, and time.  Psychiatric:        Mood and Affect: Mood normal.        Behavior: Behavior normal.        Thought Content: Thought content normal.        Judgment: Judgment normal.    Appointment on 03/27/2020  Component Date Value Ref Range Status  . Uric Acid, Serum 03/27/2020 3.3  2.5 - 7.1 mg/dL Final   Performed at [facility], [address], [place], [place] [address]  . LDH 03/27/2020 111   98 - 192 U/L Final   Performed at  [facility], [address], [place], [place] [address]  . Sodium 03/27/2020 138  135 - 145 mmol/L Final  . Potassium 03/27/2020 3.5  3.5 - 5.1 mmol/L Final  . Chloride 03/27/2020 106  98 - 111 mmol/L Final  . CO2 03/27/2020 25  22 - 32 mmol/L Final  . Glucose, Bld 03/27/2020 107* 70 - 99 mg/dL Final   Glucose reference range applies only to samples taken after fasting for at least 8 hours.  . BUN 03/27/2020 11  8 - 23 mg/dL Final  . Creatinine, Ser 03/27/2020 0.87  0.44 - 1.00 mg/dL Final  . Calcium 03/27/2020 8.3* 8.9 - 10.3 mg/dL Final  . Total Protein 03/27/2020 7.5  6.5 - 8.1 g/dL Final  . Albumin 03/27/2020 4.1  3.5 - 5.0 g/dL Final  . AST 03/27/2020 19  15 - 41 U/L Final  . ALT 03/27/2020 19  0 - 44 U/L Final  . Alkaline Phosphatase 03/27/2020 51  38 - 126 U/L Final  . Total Bilirubin 03/27/2020 0.5  0.3 - 1.2 mg/dL Final  . GFR calc non Af Amer 03/27/2020 >60  >60 mL/min Final  . GFR calc Af Amer 03/27/2020 >60  >60 mL/min Final  . Anion gap 03/27/2020 7  5 - 15 Final   Performed at [facility] [place], [address] [place], [place] [address]  . WBC 03/27/2020 8.0  4.0 - 10.5 K/uL Final  . RBC 03/27/2020 4.75  3.87 - 5.11 MIL/uL Final  . Hemoglobin 03/27/2020 13.1  12.0 - 15.0 g/dL Final  . HCT 03/27/2020 39.2  36 - 46 % Final  . MCV 03/27/2020 82.5  80.0 - 100.0 fL Final  . MCH 03/27/2020 27.6  26.0 - 34.0 pg Final  . MCHC 03/27/2020 33.4  30.0 - 36.0 g/dL Final  . RDW 03/27/2020 14.4  11.5 - 15.5 % Final  . Platelets 03/27/2020 241  150 - 400 K/uL Final  . nRBC 03/27/2020 0.0  0.0 - 0.2 % Final  . Neutrophils Relative % 03/27/2020 63  % Final  . Neutro Abs 03/27/2020 5.1  1.7 - 7.7 K/uL Final  . Lymphocytes Relative 03/27/2020 25  % Final  . Lymphs Abs 03/27/2020 2.0  0.7 - 4.0 K/uL Final  . Monocytes Relative 03/27/2020 9  % Final  . Monocytes Absolute 03/27/2020 0.7  0 - 1 K/uL Final  . Eosinophils Relative  03/27/2020 2  % Final  . Eosinophils Absolute 03/27/2020 0.1  0 - 0 K/uL Final  . Basophils Relative 03/27/2020 1  % Final  . Basophils Absolute 03/27/2020 0.1  0 - 0 K/uL Final  . Immature Granulocytes 03/27/2020 0  % Final  . Abs Immature Granulocytes 03/27/2020 0.02  0.00 - 0.07 K/uL Final   Performed at [facility], [address] [place], [place] [address]    Assessment:  Kendra Salas is a 69 y.o. with stage IIIB diffuse large B-cell lymphoma s/p right axillary biopsy on 06/19/2014.  Pathology revealed CD20+ high grade B-cell lymphoma best classified as diffuse large B cell lymphoma, activated B-cell type with CD5 coexpression.  FISH studies for MYC and BCL2 were negative.  BCL6 was positive.  Bone marrow was negative by report.  PET scan on 06/13/2014 revealed hypermetabolic left neck lymph node at the level of the angle of  the jaw consistent with metastasis.   There was a hypermetabolic mass beneath the right pectoralis muscle consistent with metastasis. There was hypermetabolic nodule  along the pleural space adjacent to the heart and aorta consistent with metastatic implant.  There was enlargement of the spleen with a hypermetabolic mass consistent with metastatic disease.   There were several indeterminate lesions.  There was a small focus of activity along the ascending colon as well as activity anterior to the bladder at site of prior surgery.   She received 6 cycles of RCHOP from 06/2014 - 10/2014.    PET scan on 09/24/2014 revealed a completed response.  PET scan on 12/03/2015 revealed no evidence of residual or recurrent hypermetabolic lymphoma. There was age advanced coronary artery atherosclerosis.   Abdomen and pelvis CT on 07/07/2017 revealed colonic diverticulosis, without radiographic evidence of diverticulitis. There was mild diffuse wall thickening of urinary bladder, suspicious for cystitis. There was a tiny amount of gas within the urinary bladder, likely  due to recent catheterization. There was a stable small hiatal hernia and fat-containing paraumbilical hernia.  MUGA revealed an EF of 70% on 06/22/2014 and an EF of 67% on 09/21/2014.  She received the [place] COVID-19 vaccines in 09/2019 and 10/2019.  Symptomatically, she denies any fevers, sweats or weight loss.  Exam reveals no adenopathy or hepatosplenomegaly.  Plan: 1.   Labs today:  CBC with diff, CMP, LDH, uric acid. 2.   Stage IIIB diffuse large B cell lymphoma  She was diagnosed with stage IIIB diffuse large B cell lymphoma in 06/2014.  She has received 6 cycles of RCHOP chemotherapy.  Clinically, she is doing well.    Exam reveals no adenopathy or hepatosplenomegaly.    Continue ongoing surveillance. 3.   Hypocalcemia   Calcium is 8.3.    Encourage calcium supplementation.   4.   RTC in 1 year for MD assessment and labs (CBC with diff, CMP, LDH, uric acid).  I discussed the assessment and treatment plan with the patient.  The patient was provided an opportunity to ask questions and all were answered.  The patient agreed with the plan and demonstrated an understanding of the instructions.  The patient was advised to call back if the symptoms worsen or if the condition fails to improve as anticipated.  I provided 20 minutes of face-to-face time during this encounter and > 50% was spent counseling as documented under my assessment and plan.    Lequita Asal, MD, PhD    03/27/2020, 2:39 PM  I, De Burrs, am acting as scribe for [employer]. Mike Gip, MD, PhD.  I, Heinz Eckert C. Mike Gip, MD, have reviewed the above documentation for accuracy and completeness, and I agree with the above.

## 2020-03-27 ENCOUNTER — Encounter: Payer: Self-pay | Admitting: Hematology and Oncology

## 2020-03-27 ENCOUNTER — Telehealth: Payer: Self-pay

## 2020-03-27 ENCOUNTER — Inpatient Hospital Stay: Payer: 59 | Attending: Hematology and Oncology

## 2020-03-27 ENCOUNTER — Other Ambulatory Visit: Payer: Self-pay

## 2020-03-27 ENCOUNTER — Inpatient Hospital Stay: Payer: 59 | Admitting: Hematology and Oncology

## 2020-03-27 VITALS — BP 144/93 | HR 82 | Temp 98.4°F | Resp 18 | Ht 72.0 in | Wt 200.9 lb

## 2020-03-27 DIAGNOSIS — C8338 Diffuse large B-cell lymphoma, lymph nodes of multiple sites: Secondary | ICD-10-CM | POA: Diagnosis not present

## 2020-03-27 DIAGNOSIS — C8334 Diffuse large B-cell lymphoma, lymph nodes of axilla and upper limb: Secondary | ICD-10-CM

## 2020-03-27 DIAGNOSIS — Z79899 Other long term (current) drug therapy: Secondary | ICD-10-CM | POA: Diagnosis not present

## 2020-03-27 DIAGNOSIS — R1310 Dysphagia, unspecified: Secondary | ICD-10-CM | POA: Insufficient documentation

## 2020-03-27 DIAGNOSIS — K429 Umbilical hernia without obstruction or gangrene: Secondary | ICD-10-CM | POA: Insufficient documentation

## 2020-03-27 DIAGNOSIS — K5730 Diverticulosis of large intestine without perforation or abscess without bleeding: Secondary | ICD-10-CM | POA: Diagnosis not present

## 2020-03-27 DIAGNOSIS — Z86018 Personal history of other benign neoplasm: Secondary | ICD-10-CM | POA: Diagnosis not present

## 2020-03-27 DIAGNOSIS — E785 Hyperlipidemia, unspecified: Secondary | ICD-10-CM | POA: Insufficient documentation

## 2020-03-27 DIAGNOSIS — Z803 Family history of malignant neoplasm of breast: Secondary | ICD-10-CM | POA: Diagnosis not present

## 2020-03-27 DIAGNOSIS — I2510 Atherosclerotic heart disease of native coronary artery without angina pectoris: Secondary | ICD-10-CM | POA: Diagnosis not present

## 2020-03-27 DIAGNOSIS — K449 Diaphragmatic hernia without obstruction or gangrene: Secondary | ICD-10-CM | POA: Insufficient documentation

## 2020-03-27 DIAGNOSIS — Z9221 Personal history of antineoplastic chemotherapy: Secondary | ICD-10-CM | POA: Insufficient documentation

## 2020-03-27 LAB — CBC WITH DIFFERENTIAL/PLATELET
Abs Immature Granulocytes: 0.02 10*3/uL (ref 0.00–0.07)
Basophils Absolute: 0.1 10*3/uL (ref 0.0–0.1)
Basophils Relative: 1 %
Eosinophils Absolute: 0.1 10*3/uL (ref 0.0–0.5)
Eosinophils Relative: 2 %
HCT: 39.2 % (ref 36.0–46.0)
Hemoglobin: 13.1 g/dL (ref 12.0–15.0)
Immature Granulocytes: 0 %
Lymphocytes Relative: 25 %
Lymphs Abs: 2 10*3/uL (ref 0.7–4.0)
MCH: 27.6 pg (ref 26.0–34.0)
MCHC: 33.4 g/dL (ref 30.0–36.0)
MCV: 82.5 fL (ref 80.0–100.0)
Monocytes Absolute: 0.7 10*3/uL (ref 0.1–1.0)
Monocytes Relative: 9 %
Neutro Abs: 5.1 10*3/uL (ref 1.7–7.7)
Neutrophils Relative %: 63 %
Platelets: 241 10*3/uL (ref 150–400)
RBC: 4.75 MIL/uL (ref 3.87–5.11)
RDW: 14.4 % (ref 11.5–15.5)
WBC: 8 10*3/uL (ref 4.0–10.5)
nRBC: 0 % (ref 0.0–0.2)

## 2020-03-27 LAB — COMPREHENSIVE METABOLIC PANEL
ALT: 19 U/L (ref 0–44)
AST: 19 U/L (ref 15–41)
Albumin: 4.1 g/dL (ref 3.5–5.0)
Alkaline Phosphatase: 51 U/L (ref 38–126)
Anion gap: 7 (ref 5–15)
BUN: 11 mg/dL (ref 8–23)
CO2: 25 mmol/L (ref 22–32)
Calcium: 8.3 mg/dL — ABNORMAL LOW (ref 8.9–10.3)
Chloride: 106 mmol/L (ref 98–111)
Creatinine, Ser: 0.87 mg/dL (ref 0.44–1.00)
GFR calc Af Amer: >60 mL/min (ref >60)
GFR calc non Af Amer: >60 mL/min (ref >60)
Glucose, Bld: 107 mg/dL — ABNORMAL HIGH (ref 70–99)
Potassium: 3.5 mmol/L (ref 3.5–5.1)
Sodium: 138 mmol/L (ref 135–145)
Total Bilirubin: 0.5 mg/dL (ref 0.3–1.2)
Total Protein: 7.5 g/dL (ref 6.5–8.1)

## 2020-03-27 LAB — LACTATE DEHYDROGENASE: LDH: 111 U/L (ref 98–192)

## 2020-03-27 LAB — URIC ACID: Uric Acid, Serum: 3.3 mg/dL (ref 2.5–7.1)

## 2020-03-27 NOTE — Telephone Encounter (Signed)
Unable to leave a message and didn't get an answer , i tried to reach the patient to inform her that her calcium was a little low today.

## 2020-03-27 NOTE — Progress Notes (Signed)
No new changes noted today 

## 2020-04-02 ENCOUNTER — Other Ambulatory Visit: Payer: Self-pay | Admitting: Internal Medicine

## 2020-04-02 DIAGNOSIS — K449 Diaphragmatic hernia without obstruction or gangrene: Secondary | ICD-10-CM

## 2020-04-02 DIAGNOSIS — I10 Essential (primary) hypertension: Secondary | ICD-10-CM

## 2020-04-02 DIAGNOSIS — E782 Mixed hyperlipidemia: Secondary | ICD-10-CM

## 2020-04-02 DIAGNOSIS — Z79890 Hormone replacement therapy (postmenopausal): Secondary | ICD-10-CM

## 2020-04-02 DIAGNOSIS — I25118 Atherosclerotic heart disease of native coronary artery with other forms of angina pectoris: Secondary | ICD-10-CM

## 2020-04-02 MED ORDER — CLOPIDOGREL BISULFATE 75 MG PO TABS: 75.0000 mg | ORAL_TABLET | Freq: Every day | ORAL | 1 refills | Status: DC

## 2020-04-02 MED ORDER — ROSUVASTATIN CALCIUM 20 MG PO TABS: 20.0000 mg | ORAL_TABLET | Freq: Every day | ORAL | 1 refills | Status: DC

## 2020-04-02 MED ORDER — ESTRADIOL 2 MG PO TABS: 2.0000 mg | ORAL_TABLET | Freq: Every day | ORAL | 1 refills | Status: DC

## 2020-04-02 MED ORDER — LOSARTAN POTASSIUM 100 MG PO TABS: 100.0000 mg | ORAL_TABLET | Freq: Every day | ORAL | 1 refills | Status: DC

## 2020-04-02 MED ORDER — FINASTERIDE 5 MG PO TABS: 5.0000 mg | ORAL_TABLET | Freq: Every day | ORAL | 1 refills | Status: DC

## 2020-04-02 MED ORDER — PANTOPRAZOLE SODIUM 40 MG PO TBEC: 40.0000 mg | DELAYED_RELEASE_TABLET | Freq: Every day | ORAL | 1 refills | Status: DC

## 2020-04-09 ENCOUNTER — Other Ambulatory Visit: Payer: Self-pay | Admitting: Pharmacist

## 2020-04-09 ENCOUNTER — Other Ambulatory Visit: Payer: Self-pay | Admitting: Dermatology

## 2020-04-09 MED ORDER — DUPIXENT 300 MG/2ML ~~LOC~~ SOSY: PREFILLED_SYRINGE | SUBCUTANEOUS | 5 refills | Status: DC

## 2020-04-09 MED FILL — DUPIXENT 300 MG/2 ML SAFE S: 300 | 28 days supply | Qty: 4 | Fill #0

## 2020-04-10 ENCOUNTER — Other Ambulatory Visit: Payer: Self-pay

## 2020-04-10 ENCOUNTER — Ambulatory Visit (HOSPITAL_BASED_OUTPATIENT_CLINIC_OR_DEPARTMENT_OTHER): Payer: 59 | Admitting: Pharmacist

## 2020-04-10 DIAGNOSIS — Z79899 Other long term (current) drug therapy: Secondary | ICD-10-CM

## 2020-04-10 NOTE — Progress Notes (Signed)
   S: Patient is currently taking Dupixent for eczema. Patient is managed by Dr. Nehemiah Massed for this. Had a visit with Dr. Nehemiah Massed recently and [place] therapy was continued.   Adherence: denies any missed doses  Efficacy: reports that it is working well for her  Dosing: 300 mg every 14 days  Dose adjustments: Renal: no dose adjustments (has not been studied) Hepatic: no dose adjustments (has not been studied)  Screening: TB test: completed per patient  Monitoring: S/sx of infection: denies S/sx of hypersensitivity: denies S/sx of ocular effects: denies S/sx of eosinophilia/vasculitis: denies  O:  Lab Results  Component Value Date   WBC 8.0 03/27/2020   HGB 13.1 03/27/2020   HCT 39.2 03/27/2020   MCV 82.5 03/27/2020   PLT 241 03/27/2020      Chemistry      Component Value Date/Time   NA 138 03/27/2020 1337   NA 140 05/04/2019 0803   NA 139 10/16/2014 0845   K 3.5 03/27/2020 1337   K 3.7 10/16/2014 0845   CL 106 03/27/2020 1337   CL 107 10/16/2014 0845   CO2 25 03/27/2020 1337   CO2 25 10/16/2014 0845   BUN 11 03/27/2020 1337   BUN 10 05/04/2019 0803   BUN 11 10/16/2014 0845   CREATININE 0.87 03/27/2020 1337   CREATININE 0.85 10/16/2014 0845   GLU 91 12/31/2017 0000      Component Value Date/Time   CALCIUM 8.3 (L) 03/27/2020 1337   CALCIUM 8.1 (L) 10/16/2014 0845   ALKPHOS 51 03/27/2020 1337   ALKPHOS 47 10/16/2014 0845   AST 19 03/27/2020 1337   AST 23 10/16/2014 0845   ALT 19 03/27/2020 1337   ALT 18 10/16/2014 0845   BILITOT 0.5 03/27/2020 1337   BILITOT 0.5 05/04/2019 0803   BILITOT 0.3 10/16/2014 0845       A/P: 1. Medication review: Patient currently on [place] for eczema and is tolerating it well. Reviewed the medication with the patient, including the following: Dupixent is a monoclonal antibody used for the treatment of asthma or atopic dermatitis. Patient educated on purpose, proper use and potential adverse effects of Dupixent. Possible  adverse effects include increased risk of infection, ocular effects, vasculitis/eosinophilia, and hypersensitivity reactions. No recommendations for any changes.  Benard Halsted, PharmD, [place] [phone]

## 2020-05-21 MED FILL — DUPIXENT 300 MG/2 ML SAFE S: 300 | 28 days supply | Qty: 4 | Fill #1

## 2020-05-22 ENCOUNTER — Encounter: Payer: Self-pay | Admitting: Internal Medicine

## 2020-05-22 ENCOUNTER — Other Ambulatory Visit: Payer: Self-pay

## 2020-05-22 ENCOUNTER — Ambulatory Visit (INDEPENDENT_AMBULATORY_CARE_PROVIDER_SITE_OTHER): Payer: 59 | Admitting: Internal Medicine

## 2020-05-22 VITALS — BP 140/94 | HR 77 | Temp 97.7°F | Ht 72.0 in | Wt 196.0 lb

## 2020-05-22 DIAGNOSIS — E782 Mixed hyperlipidemia: Secondary | ICD-10-CM

## 2020-05-22 DIAGNOSIS — C8334 Diffuse large B-cell lymphoma, lymph nodes of axilla and upper limb: Secondary | ICD-10-CM

## 2020-05-22 DIAGNOSIS — I10 Essential (primary) hypertension: Secondary | ICD-10-CM | POA: Diagnosis not present

## 2020-05-22 DIAGNOSIS — Z13820 Encounter for screening for osteoporosis: Secondary | ICD-10-CM | POA: Diagnosis not present

## 2020-05-22 DIAGNOSIS — Z0000 Encounter for general adult medical examination without abnormal findings: Secondary | ICD-10-CM | POA: Diagnosis not present

## 2020-05-22 DIAGNOSIS — Z1159 Encounter for screening for other viral diseases: Secondary | ICD-10-CM | POA: Diagnosis not present

## 2020-05-22 DIAGNOSIS — Z1231 Encounter for screening mammogram for malignant neoplasm of breast: Secondary | ICD-10-CM

## 2020-05-22 DIAGNOSIS — Z85820 Personal history of malignant melanoma of skin: Secondary | ICD-10-CM | POA: Diagnosis not present

## 2020-05-22 DIAGNOSIS — I2510 Atherosclerotic heart disease of native coronary artery without angina pectoris: Secondary | ICD-10-CM | POA: Diagnosis not present

## 2020-05-22 MED ORDER — HYDROCHLOROTHIAZIDE 12.5 MG PO CAPS: 12.5000 mg | ORAL_CAPSULE | Freq: Every day | ORAL | 1 refills | Status: DC

## 2020-05-22 NOTE — Progress Notes (Signed)
Date:  05/22/2020   Name:  MEILING HENDRIKS   DOB:  12-12-1950   MRN:  [id number]   Chief Complaint: Annual Exam (breast exam no pap)  Kendra Salas is a 69 y.o. adult who presents today for her Complete Annual Exam. She feels well. She reports exercising walking 7 day a week. She reports she is sleeping fairly well. Breast complaints none.  Mammogram: 12/2017 at [facility] DEXA: none Pap smear: discontinued Colonoscopy: 06/2012 no polyps  Immunization History  Administered Date(s) Administered  . Influenza-Unspecified 07/21/2016  . PFIZER SARS-COV-2 Vaccination 10/02/2019, 10/23/2019  . Pneumococcal Conjugate-13 06/05/2014, 06/26/2016  . Pneumococcal Polysaccharide-23 03/30/2018  . Tdap 06/05/2014  . Zoster 04/12/2015    Hypertension This is a chronic problem. The problem has been gradually improving since onset. The problem is controlled. Pertinent negatives include no chest pain, headaches, palpitations or shortness of breath. Past treatments include angiotensin blockers (losartan dose increased in June). The current treatment provides significant improvement. Hypertensive end-organ damage includes CAD/MI.  Hyperlipidemia This is a chronic problem. The problem is controlled. Pertinent negatives include no chest pain, myalgias or shortness of breath. Current antihyperlipidemic treatment includes statins. The current treatment provides significant improvement of lipids.  Psoriasis and hx of Melanoma - followed by Dermatology, on Dupixent injections.  Lab Results  Component Value Date   CREATININE 0.87 03/27/2020   BUN 11 03/27/2020   NA 138 03/27/2020   K 3.5 03/27/2020   CL 106 03/27/2020   CO2 25 03/27/2020   Lab Results  Component Value Date   CHOL 152 01/03/2020   HDL 49 01/03/2020   LDLCALC 89 01/03/2020   TRIG 100 01/03/2020   CHOLHDL 3.2 05/04/2019   Lab Results  Component Value Date   TSH 2.40 01/03/2020   No results found for: HGBA1C Lab Results  Component  Value Date   WBC 8.0 03/27/2020   HGB 13.1 03/27/2020   HCT 39.2 03/27/2020   MCV 82.5 03/27/2020   PLT 241 03/27/2020   Lab Results  Component Value Date   ALT 19 03/27/2020   AST 19 03/27/2020   ALKPHOS 51 03/27/2020   BILITOT 0.5 03/27/2020     Review of Systems  Constitutional: Negative for appetite change, chills, diaphoresis, fatigue and unexpected weight change.  HENT: Negative for hearing loss, tinnitus, trouble swallowing and voice change.   Eyes: Negative for visual disturbance.  Respiratory: Negative for choking, shortness of breath and wheezing.   Cardiovascular: Negative for chest pain, palpitations and leg swelling.  Gastrointestinal: Negative for abdominal pain, blood in stool, constipation and diarrhea.  Genitourinary: Negative for difficulty urinating (mild hesitancy), dysuria and frequency.  Musculoskeletal: Negative for arthralgias, back pain and myalgias.  Skin: Negative for color change and rash.  Neurological: Negative for dizziness, syncope and headaches.  Hematological: Negative for adenopathy. Bruises/bleeds easily.  Psychiatric/Behavioral: Negative for dysphoric mood and sleep disturbance.    Patient Active Problem List   Diagnosis Date Noted  . Hx of melanoma of skin 05/22/2020  . Psoriasis 03/30/2018  . Mixed hyperlipidemia 03/31/2016  . Breathlessness on exertion 03/31/2016  . Special screening for malignant neoplasms, colon   . Benign neoplasm of ascending colon   . CAD (coronary artery disease) 03/03/2015  . Diverticulosis 03/03/2015  . Benign essential HTN 03/03/2015  . 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 AUGMENTATION MAMMAPLASTY Bilateral 2003  .  CARDIAC CATHETERIZATION     X2 - has 5 stents. 4 LAD, 1 posterior, Last 3 placed 2004  . COLONOSCOPY  06/29/2012   repeat 10 yrs - Dr. Candace Cruise  . ESOPHAGOGASTRODUODENOSCOPY (EGD)  WITH PROPOFOL N/A 11/22/2015   Procedure: ESOPHAGOGASTRODUODENOSCOPY (EGD) WITH PROPOFOL;  Surgeon: Lucilla Lame, MD;  Location: [facility];  Service: Endoscopy;  Laterality: N/A;  . FACIAL COSMETIC SURGERY    . gender re-affirmation    . HERNIA REPAIR     x2  . PORTA CATH REMOVAL N/A 04/10/2019   Procedure: PORTA CATH REMOVAL;  Surgeon: Algernon Huxley, MD;  Location: [facility];  Service: Cardiovascular;  Laterality: N/A;    Social History   Tobacco Use  . Smoking status: Never Smoker  . Smokeless tobacco: Never Used  Vaping Use  . Vaping Use: Never used  Substance Use Topics  . Alcohol use: Yes    Alcohol/week: 1.0 standard drink    Types: 1 Shots of liquor per week  . Drug use: Never     Medication list has been reviewed and updated.  Current Meds  Medication Sig  . aspirin 81 MG tablet Take 81 mg by mouth daily.  . clopidogrel (PLAVIX) 75 MG tablet Take 1 tablet (75 mg total) by mouth daily.  . dupilumab (DUPIXENT) 300 MG/2ML prefilled syringe INJECT 1 SYRINGE UNDER THE SKIN AS DIRECTED EVERY OTHER WEEK  . estradiol (ESTRACE) 2 MG tablet Take 1 tablet (2 mg total) by mouth daily.  . finasteride (PROSCAR) 5 MG tablet Take 1 tablet (5 mg total) by mouth daily.  . hyoscyamine (ANASPAZ) 0.125 MG TBDP disintergrating tablet Place 1 tablet (0.125 mg total) under the tongue every 6 (six) hours as needed.  Marland Kitchen losartan (COZAAR) 100 MG tablet Take 1 tablet (100 mg total) by mouth daily.  . ondansetron (ZOFRAN) 4 MG tablet Take 1 tablet (4 mg total) by mouth every 8 (eight) hours as needed for nausea or vomiting.  . pantoprazole (PROTONIX) 40 MG tablet Take 1 tablet (40 mg total) by mouth daily.  . rosuvastatin (CRESTOR) 20 MG tablet Take 1 tablet (20 mg total) by mouth daily.  Marland Kitchen triamcinolone cream (KENALOG) 0.1 % Apply 1 application topically 2 (two) times daily.    PHQ 2/9 Scores 05/22/2020 05/03/2019 03/30/2018  PHQ - 2 Score 0 0 0  PHQ- 9 Score 0 - -  Exception  Documentation - - Patient refusal    GAD 7 : Generalized Anxiety Score 05/22/2020  Nervous, Anxious, on Edge 0  Control/stop worrying 0  Worry too much - different things 0  Trouble relaxing 0  Restless 0  Easily annoyed or irritable 0  Afraid - awful might happen 0  Total GAD 7 Score 0  Anxiety Difficulty Not difficult at all    BP Readings from Last 3 Encounters:  05/22/20 (!) 140/94  03/27/20 (!) 144/93  05/03/19 (!) 142/100    Physical Exam Vitals and nursing note reviewed.  Constitutional:      General: She is not in acute distress.    Appearance: She is well-developed.  HENT:     Head: Normocephalic and atraumatic.     Right Ear: Tympanic membrane and ear canal normal.     Left Ear: Tympanic membrane and ear canal normal.     Nose:     Right Sinus: No maxillary sinus tenderness.     Left Sinus: No maxillary sinus tenderness.  Eyes:     General: No scleral icterus.  Right eye: No discharge.        Left eye: No discharge.     Conjunctiva/sclera: Conjunctivae normal.  Neck:     Thyroid: No thyromegaly.     Vascular: No carotid bruit.  Cardiovascular:     Rate and Rhythm: Normal rate and regular rhythm.     Pulses: Normal pulses.     Heart sounds: Normal heart sounds.  Pulmonary:     Effort: Pulmonary effort is normal. No respiratory distress.     Breath sounds: No wheezing.  Chest:     Breasts:        Right: No mass, nipple discharge, skin change or tenderness.        Left: No mass, nipple discharge, skin change or tenderness.     Comments: Breast surgery scars healed Abdominal:     General: Bowel sounds are normal.     Palpations: Abdomen is soft.     Tenderness: There is no abdominal tenderness.  Genitourinary:    Prostate: Not enlarged and not tender.     Comments: Prostate not palpable Musculoskeletal:     Cervical back: Normal range of motion. No erythema.     Right lower leg: No edema.     Left lower leg: No edema.  Lymphadenopathy:      Cervical: No cervical adenopathy.  Skin:    General: Skin is warm and dry.     Findings: No rash.  Neurological:     Mental Status: She is alert and oriented to person, place, and time.     Cranial Nerves: No cranial nerve deficit.     Sensory: No sensory deficit.     Deep Tendon Reflexes: Reflexes are normal and symmetric.  Psychiatric:        Attention and Perception: Attention normal.        Mood and Affect: Mood normal.     Wt Readings from Last 3 Encounters:  05/22/20 196 lb (88.9 kg)  03/27/20 200 lb 15.2 oz (91.2 kg)  05/03/19 198 lb (89.8 kg)    BP (!) 140/94   Pulse 77   Temp 97.7 F (36.5 C) (Oral)   Ht 6' (1.829 m)   Wt 196 lb (88.9 kg)   SpO2 97%   BMI 26.58 kg/m   Assessment and Plan: 1. Annual physical exam Continue healthy diet and regular exercise  2. Encounter for screening mammogram for breast cancer Schedule at [facility]; Future  3. Need for hepatitis C screening test  4. Coronary artery disease involving native coronary artery of native heart without angina pectoris Followed by cardiology No sx of angina, SOB, edema  5. Mixed hyperlipidemia Tolerating statin medication without side effects at this time LDL is at goal of < 70 on current dose Continue same therapy without change at this time.  6. Diffuse large B-cell lymphoma of lymph nodes of axilla (HCC) Followed by Oncology  7. Hx of melanoma of skin Followed by Dermatology  8. Essential hypertension BP not well controlled on losartan alone Will add HCTZ 12.5 mg Follow up BP in one month - hydrochlorothiazide (MICROZIDE) 12.5 MG capsule; Take 1 capsule (12.5 mg total) by mouth daily.  Dispense: 90 capsule; Refill: 1  9. Encounter for screening for osteoporosis - DG Bone Density; Future   Partially dictated using Editor, commissioning. Any errors are unintentional.  Halina Maidens, MD [facility]  05/22/2020

## 2020-05-28 ENCOUNTER — Other Ambulatory Visit: Payer: Self-pay | Admitting: Internal Medicine

## 2020-05-28 DIAGNOSIS — Z780 Asymptomatic menopausal state: Secondary | ICD-10-CM

## 2020-06-07 ENCOUNTER — Ambulatory Visit: Payer: Self-pay | Attending: Internal Medicine

## 2020-06-07 DIAGNOSIS — Z23 Encounter for immunization: Secondary | ICD-10-CM

## 2020-06-07 NOTE — Progress Notes (Signed)
   Covid-19 Vaccination Clinic  Name:  Kendra Salas    MRN: [id number] DOB: 12/06/1950  06/07/2020  [name] was observed post Covid-19 immunization for 15 minutes without incident. She was provided with Vaccine Information Sheet and instruction to access the V-Safe system.   [name] was instructed to call 911 with any severe reactions post vaccine: Marland Kitchen Difficulty breathing  . Swelling of face and throat  . A fast heartbeat  . A bad rash all over body  . Dizziness and weakness

## 2020-06-12 ENCOUNTER — Other Ambulatory Visit: Payer: Self-pay

## 2020-06-12 ENCOUNTER — Ambulatory Visit
Admission: RE | Admit: 2020-06-12 | Discharge: 2020-06-12 | Disposition: A | Discharge status: Home / Self Care | Payer: 59 | Source: Ambulatory Visit | Attending: Internal Medicine | Admitting: Internal Medicine

## 2020-06-12 DIAGNOSIS — Z780 Asymptomatic menopausal state: Secondary | ICD-10-CM | POA: Insufficient documentation

## 2020-06-12 DIAGNOSIS — Z1231 Encounter for screening mammogram for malignant neoplasm of breast: Secondary | ICD-10-CM | POA: Insufficient documentation

## 2020-06-12 DIAGNOSIS — R29890 Loss of height: Secondary | ICD-10-CM | POA: Diagnosis not present

## 2020-06-12 DIAGNOSIS — Z13820 Encounter for screening for osteoporosis: Secondary | ICD-10-CM | POA: Diagnosis not present

## 2020-06-25 MED FILL — DUPIXENT 300 MG/2 ML SAFE S: 300 | 28 days supply | Qty: 4 | Fill #2

## 2020-06-27 ENCOUNTER — Other Ambulatory Visit: Payer: Self-pay | Admitting: Internal Medicine

## 2020-06-27 DIAGNOSIS — L309 Dermatitis, unspecified: Secondary | ICD-10-CM

## 2020-06-27 MED ORDER — TRIAMCINOLONE ACETONIDE 0.1 % EX CREA: 1.0000 "application " | TOPICAL_CREAM | Freq: Two times a day (BID) | CUTANEOUS | 3 refills | Status: AC

## 2020-07-10 ENCOUNTER — Other Ambulatory Visit: Payer: Self-pay

## 2020-07-10 DIAGNOSIS — Z79890 Hormone replacement therapy (postmenopausal): Secondary | ICD-10-CM

## 2020-07-10 MED ORDER — ESTRADIOL 2 MG PO TABS: 2.0000 mg | ORAL_TABLET | Freq: Every day | ORAL | 1 refills | Status: DC

## 2020-07-24 MED FILL — DUPIXENT 300 MG/2 ML SAFE S: 300 | 28 days supply | Qty: 4 | Fill #3

## 2020-08-26 MED FILL — DUPIXENT 300 MG/2 ML SAFE S: 300 | 28 days supply | Qty: 4 | Fill #4

## 2020-09-04 ENCOUNTER — Ambulatory Visit: Payer: 59 | Admitting: Dermatology

## 2020-09-30 MED FILL — DUPIXENT 300 MG/2 ML SAFE S: 300 | 28 days supply | Qty: 4 | Fill #5

## 2020-10-24 ENCOUNTER — Other Ambulatory Visit: Payer: Self-pay | Admitting: Dermatology

## 2020-10-28 ENCOUNTER — Other Ambulatory Visit: Payer: Self-pay

## 2020-10-28 ENCOUNTER — Other Ambulatory Visit: Payer: Self-pay | Admitting: Pharmacist

## 2020-10-28 MED ORDER — DUPIXENT 300 MG/2ML ~~LOC~~ SOSY: PREFILLED_SYRINGE | SUBCUTANEOUS | 0 refills | Status: DC

## 2020-10-28 MED FILL — DUPIXENT 300 MG/2 ML SAFE S: 300 | 28 days supply | Qty: 4 | Fill #0

## 2020-10-28 NOTE — Progress Notes (Signed)
Patient has upcoming appt on 11/20/20 for Atopic Derm follow up.   Dupixent RF sent in.

## 2020-11-20 ENCOUNTER — Other Ambulatory Visit: Payer: Self-pay | Admitting: Dermatology

## 2020-11-20 ENCOUNTER — Other Ambulatory Visit: Payer: Self-pay

## 2020-11-20 ENCOUNTER — Ambulatory Visit: Payer: 59 | Admitting: Dermatology

## 2020-11-20 DIAGNOSIS — L209 Atopic dermatitis, unspecified: Secondary | ICD-10-CM | POA: Diagnosis not present

## 2020-11-20 MED ORDER — TRIAMCINOLONE ACETONIDE 0.1 % EX CREA: TOPICAL_CREAM | CUTANEOUS | 60 refills | Status: DC

## 2020-11-20 MED ORDER — DUPIXENT 300 MG/2ML ~~LOC~~ SOSY: PREFILLED_SYRINGE | SUBCUTANEOUS | 6 refills | Status: DC

## 2020-11-20 MED ORDER — EUCRISA 2 % EX OINT: TOPICAL_OINTMENT | CUTANEOUS | 6 refills | Status: DC

## 2020-11-20 MED FILL — EUCRISA 2% OINTMENT: 2 | 30 days supply | Qty: 60 | Fill #0

## 2020-11-20 MED FILL — TRIAMCINOLONE 0.1% CREAM: 0.1 | 30 days supply | Qty: 80 | Fill #0

## 2020-11-20 NOTE — Progress Notes (Signed)
   Follow-Up Visit   Subjective  Kendra Salas is a 70 y.o. adult who presents for the following: Atopic dermatitis  (Patient is currently using Dupixent 300mg /27mL SQ Q2W, Eucrisa 2% ointment alternating with TMC 0.1% ointment, but started to flare recently on the buttocks since she missing a Dupixent injection). Pt says Dupixent has been a life altering godsend treatment.  The following portions of the chart were reviewed this encounter and updated as appropriate:   Tobacco  Allergies  Meds  Problems  Med Hx  Surg Hx  Fam Hx     Review of Systems:  No other skin or systemic complaints except as noted in HPI or Assessment and Plan.  Objective  Well appearing patient in no apparent distress; mood and affect are within normal limits.  A focused examination was performed including the arms and buttocks. Relevant physical exam findings are noted in the Assessment and Plan.  Objective  Buttocks, trunk, extremities: Patchy pinkness with excoriations of the B/L buttocks   Assessment & Plan  Atopic dermatitis, Buttocks, trunk, extremities Flaring due to missed Dupixent dose -   Atopic dermatitis - Severe, on Dupixent (biologic medication).  Atopic dermatitis (eczema) is a chronic, relapsing, pruritic condition that can significantly affect quality of life. It is often associated with allergic rhinitis and/or asthma and can require treatment with topical medications, phototherapy, or in severe cases a biologic medication called Dupixent.    Continue Dupixent 300mg /31mL SQ QOW, Eucrisa 2% ointment QD-BID, and TMC 0.1% ointment to more stubborn areas that will not improve with Eucrisa 2% ointment.   Discussed Rinvoq and new atopic dermatitis injection Adbry, but patient is pleased with the current treatment plan.   triamcinolone (KENALOG) 0.1 % - Buttocks, trunk, extremities  Crisaborole (EUCRISA) 2 % OINT - Buttocks, trunk, extremities  Return in about 4 months (around 03/20/2021)  for TBSE.  Luther Redo, CMA, am acting as scribe for Sarina Ser, MD .  Documentation: I have reviewed the above documentation for accuracy and completeness, and I agree with the above.  Sarina Ser, MD

## 2020-11-21 ENCOUNTER — Other Ambulatory Visit: Payer: Self-pay | Admitting: Pharmacist

## 2020-11-21 ENCOUNTER — Encounter: Payer: Self-pay | Admitting: Dermatology

## 2020-11-21 MED ORDER — DUPIXENT 300 MG/2ML ~~LOC~~ SOSY: PREFILLED_SYRINGE | SUBCUTANEOUS | 6 refills | Status: DC

## 2020-11-25 ENCOUNTER — Other Ambulatory Visit (INDEPENDENT_AMBULATORY_CARE_PROVIDER_SITE_OTHER): Payer: 59

## 2020-11-25 DIAGNOSIS — Z23 Encounter for immunization: Secondary | ICD-10-CM

## 2020-12-02 MED FILL — DUPIXENT 300 MG/2 ML SAFE S: 300 | 28 days supply | Qty: 4 | Fill #0

## 2020-12-31 ENCOUNTER — Other Ambulatory Visit (HOSPITAL_COMMUNITY): Payer: Self-pay

## 2021-01-02 LAB — LIPID PANEL
Cholesterol: 141 (ref 0–200)
HDL: 44 (ref 35–70)
LDL Cholesterol: 77
Triglycerides: 110 (ref 40–160)

## 2021-01-02 LAB — CBC AND DIFFERENTIAL
HCT: 40 (ref 39–52)
Hemoglobin: 13.4 (ref 13.0–17.0)
Platelets: 244 (ref 150–399)
WBC: 7.2

## 2021-01-02 LAB — BASIC METABOLIC PANEL
BUN: 12 (ref 4–21)
Chloride: 102 (ref 99–108)
Creatinine: 0.9
Glucose: 91
Potassium: 4.2 (ref 3.4–5.3)
Sodium: 139 (ref 137–147)

## 2021-01-02 LAB — TSH: TSH: 2.65 (ref 0.41–5.90)

## 2021-01-02 LAB — HEPATIC FUNCTION PANEL
ALT: 16
AST: 18

## 2021-01-02 LAB — CBC: RBC: 4.74 (ref 3.87–5.11)

## 2021-01-02 LAB — PSA: PSA: 0.1

## 2021-01-02 LAB — COMPREHENSIVE METABOLIC PANEL
Albumin: 4.1 (ref 3.5–5.0)
Calcium: 8.6 — AB (ref 8.7–10.7)

## 2021-01-02 LAB — HEMOGLOBIN A1C: Hemoglobin A1C: 5.6

## 2021-01-04 ENCOUNTER — Other Ambulatory Visit (HOSPITAL_COMMUNITY): Payer: Self-pay

## 2021-01-04 MED FILL — Dupilumab Subcutaneous Soln Prefilled Syringe 300 MG/2ML: SUBCUTANEOUS | 28 days supply | Qty: 4 | Fill #0 | Status: AC

## 2021-01-07 ENCOUNTER — Other Ambulatory Visit: Payer: Self-pay

## 2021-01-07 DIAGNOSIS — B373 Candidiasis of vulva and vagina: Secondary | ICD-10-CM

## 2021-01-07 DIAGNOSIS — B3731 Acute candidiasis of vulva and vagina: Secondary | ICD-10-CM

## 2021-01-07 MED ORDER — NYSTATIN 100000 UNIT/GM EX CREA
1.0000 "application " | TOPICAL_CREAM | Freq: Two times a day (BID) | CUTANEOUS | 0 refills | Status: DC
  Filled 2021-01-07: qty 30, 15d supply, fill #0

## 2021-01-07 MED ORDER — FLUCONAZOLE 150 MG PO TABS
150.0000 mg | ORAL_TABLET | Freq: Once | ORAL | 0 refills | Status: DC
  Filled 2021-01-07: qty 1, 1d supply, fill #0

## 2021-01-07 MED ORDER — NYSTATIN 100000 UNIT/GM EX CREA
1.0000 "application " | TOPICAL_CREAM | Freq: Two times a day (BID) | CUTANEOUS | 1 refills | Status: DC
  Filled 2021-01-07: qty 30, 15d supply, fill #0

## 2021-01-07 MED ORDER — FLUCONAZOLE 150 MG PO TABS
150.0000 mg | ORAL_TABLET | Freq: Once | ORAL | 1 refills | Status: AC
  Filled 2021-01-07: qty 1, 1d supply, fill #0

## 2021-01-08 ENCOUNTER — Other Ambulatory Visit: Payer: Self-pay

## 2021-01-08 ENCOUNTER — Ambulatory Visit: Payer: 59 | Attending: Internal Medicine

## 2021-01-08 DIAGNOSIS — Z23 Encounter for immunization: Secondary | ICD-10-CM

## 2021-01-08 NOTE — Progress Notes (Signed)
   Covid-19 Vaccination Clinic  Name:  Kendra Salas    MRN: [id number] DOB: 1950/10/13  01/08/2021  [name] was observed post Covid-19 immunization for 15 minutes without incident. She was provided with Vaccine Information Sheet and instruction to access the V-Safe system.   [name] was instructed to call 911 with any severe reactions post vaccine: Marland Kitchen Difficulty breathing  . Swelling of face and throat  . A fast heartbeat  . A bad rash all over body  . Dizziness and weakness   Immunizations Administered    Name Date Dose VIS Date Route   PFIZER Comrnaty(Gray TOP) Covid-19 Vaccine 01/08/2021  1:24 PM 0.3 mL 09/12/2020 Intramuscular   Manufacturer: [employer]   Lot: [id number]   [place]: [phone]

## 2021-01-13 ENCOUNTER — Other Ambulatory Visit: Payer: Self-pay

## 2021-01-13 MED ORDER — PFIZER-BIONT COVID-19 VAC-TRIS 30 MCG/0.3ML IM SUSP
INTRAMUSCULAR | 0 refills | Status: DC
  Filled 2021-01-13: qty 0.3, 20d supply, fill #0

## 2021-01-27 ENCOUNTER — Other Ambulatory Visit: Payer: Self-pay | Admitting: Internal Medicine

## 2021-01-27 ENCOUNTER — Other Ambulatory Visit: Payer: Self-pay

## 2021-01-27 DIAGNOSIS — I25118 Atherosclerotic heart disease of native coronary artery with other forms of angina pectoris: Secondary | ICD-10-CM

## 2021-01-27 DIAGNOSIS — I10 Essential (primary) hypertension: Secondary | ICD-10-CM

## 2021-01-27 DIAGNOSIS — K219 Gastro-esophageal reflux disease without esophagitis: Secondary | ICD-10-CM

## 2021-01-27 DIAGNOSIS — Z79890 Hormone replacement therapy (postmenopausal): Secondary | ICD-10-CM

## 2021-01-27 DIAGNOSIS — E782 Mixed hyperlipidemia: Secondary | ICD-10-CM

## 2021-01-27 MED ORDER — LOSARTAN POTASSIUM 100 MG PO TABS
ORAL_TABLET | Freq: Every day | ORAL | 1 refills | Status: DC
  Filled 2021-01-27: qty 90, 90d supply, fill #0

## 2021-01-27 MED ORDER — CLOPIDOGREL BISULFATE 75 MG PO TABS
ORAL_TABLET | Freq: Every day | ORAL | 1 refills | Status: DC
  Filled 2021-01-27: qty 90, 90d supply, fill #0
  Filled 2021-05-07: qty 90, 90d supply, fill #1

## 2021-01-27 MED ORDER — FINASTERIDE 5 MG PO TABS
ORAL_TABLET | Freq: Every day | ORAL | 1 refills | Status: DC
  Filled 2021-01-27: qty 90, 90d supply, fill #0
  Filled 2021-05-07: qty 90, 90d supply, fill #1

## 2021-01-27 MED ORDER — PANTOPRAZOLE SODIUM 40 MG PO TBEC
DELAYED_RELEASE_TABLET | Freq: Every day | ORAL | 1 refills | Status: DC
  Filled 2021-01-27: qty 90, 90d supply, fill #0
  Filled 2021-05-07: qty 90, 90d supply, fill #1

## 2021-01-27 MED ORDER — ROSUVASTATIN CALCIUM 20 MG PO TABS
ORAL_TABLET | Freq: Every day | ORAL | 1 refills | Status: DC
  Filled 2021-01-27: qty 90, 90d supply, fill #0

## 2021-01-27 MED FILL — Estradiol Tab 2 MG: ORAL | 90 days supply | Qty: 90 | Fill #0 | Status: AC

## 2021-01-27 NOTE — Telephone Encounter (Signed)
Requested medication (s) are due for refill today: yes  Requested medication (s) are on the active medication list: yes   Future visit scheduled: no  Notes to clinic:  Patient due for labs and 6 month follow up    Requested Prescriptions  Pending Prescriptions Disp Refills   losartan (COZAAR) 100 MG tablet 90 tablet 1    Sig: TAKE 1 TABLET BY MOUTH DAILY.      Cardiovascular:  Angiotensin Receptor Blockers Failed - 01/27/2021  8:07 AM      Failed - Cr in normal range and within 180 days    Creatinine  Date Value Ref Range Status  10/16/2014 0.85 0.60 - 1.30 mg/dL Final   Creatinine, Ser  Date Value Ref Range Status  03/27/2020 0.87 0.44 - 1.00 mg/dL Final          Failed - K in normal range and within 180 days    Potassium  Date Value Ref Range Status  03/27/2020 3.5 3.5 - 5.1 mmol/L Final  10/16/2014 3.7 3.5 - 5.1 mmol/L Final          Failed - Last BP in normal range    BP Readings from Last 1 Encounters:  05/22/20 (!) 140/94          Failed - Valid encounter within last 6 months    Recent Outpatient Visits           8 months ago Annual physical exam   [facility] Glean Hess, MD   9 months ago Encounter for medication review   [place], RPH-CPP   1 year ago Annual physical exam   [facility] Glean Hess, MD   2 years ago Annual physical exam   [facility] Glean Hess, MD       Future Appointments             In 1 month Ralene Bathe, MD [place] - Patient is not pregnant        clopidogrel (PLAVIX) 75 MG tablet 90 tablet 1    Sig: TAKE 1 TABLET BY MOUTH DAILY.      Hematology: Antiplatelets - clopidogrel Failed - 01/27/2021  8:07 AM      Failed - Evaluate AST, ALT within 2 months of therapy initiation.      Failed - HCT in normal range and within 180 days    HCT  Date Value Ref Range Status   03/27/2020 39.2 36.0 - 46.0 % Final  11/06/2014 33.4 (L) 35.0 - 47.0 % Final          Failed - HGB in normal range and within 180 days    Hemoglobin  Date Value Ref Range Status  03/27/2020 13.1 12.0 - 15.0 g/dL Final   HGB  Date Value Ref Range Status  11/06/2014 11.4 (L) 12.0 - 16.0 g/dL Final          Failed - PLT in normal range and within 180 days    Platelets  Date Value Ref Range Status  03/27/2020 241 150 - 400 K/uL Final   Platelet  Date Value Ref Range Status  11/06/2014 194 150 - 440 x10 3/mm  Final          Failed - Valid encounter within last 6 months    Recent Outpatient Visits  8 months ago Annual physical exam   [facility] Glean Hess, MD   9 months ago Encounter for medication review   [place], RPH-CPP   1 year ago Annual physical exam   [facility] Glean Hess, MD   2 years ago Annual physical exam   [facility] Glean Hess, MD       Future Appointments             In 1 month Ralene Bathe, MD [place] - ALT in normal range and within 360 days    ALT  Date Value Ref Range Status  03/27/2020 19 0 - 44 U/L Final   SGPT (ALT)  Date Value Ref Range Status  10/16/2014 18 U/L Final    Comment:    14-63 NOTE: New Reference Range 04/24/14           Passed - AST in normal range and within 360 days    AST  Date Value Ref Range Status  03/27/2020 19 15 - 41 U/L Final   SGOT(AST)  Date Value Ref Range Status  10/16/2014 23 15 - 37 Unit/L Final            finasteride (PROSCAR) 5 MG tablet 90 tablet 1    Sig: TAKE 1 TABLET BY MOUTH DAILY.      Urology: 5-alpha Reductase Inhibitors Passed - 01/27/2021  8:07 AM      Passed - Valid encounter within last 12 months    Recent Outpatient Visits           8 months ago Annual physical exam   [facility] Glean Hess, MD    9 months ago Encounter for medication review   [place], RPH-CPP   1 year ago Annual physical exam   [facility] Glean Hess, MD   2 years ago Annual physical exam   [facility] Glean Hess, MD       Future Appointments             In 1 month Ralene Bathe, MD [place]               rosuvastatin (CRESTOR) 20 MG tablet 90 tablet 1    Sig: TAKE 1 TABLET BY MOUTH DAILY.      Cardiovascular:  Antilipid - Statins Failed - 01/27/2021  8:07 AM      Failed - Total Cholesterol in normal range and within 360 days    Cholesterol, Total  Date Value Ref Range Status  05/04/2019 125 100 - 199 mg/dL Final   Cholesterol  Date Value Ref Range Status  01/03/2020 152 0 - 200 Final          Failed - LDL in normal range and within 360 days    LDL Calculated  Date Value Ref Range Status  05/04/2019 66 0 - 99 mg/dL Final   LDL Cholesterol  Date Value Ref Range Status  01/03/2020 89  Final          Failed - HDL in normal range and within 360 days    HDL  Date Value Ref Range Status  01/03/2020 49 35 - 70 Final  05/04/2019 39 (L) >39 mg/dL Final  Failed - Triglycerides in normal range and within 360 days    Triglycerides  Date Value Ref Range Status  01/03/2020 100 40 - 160 Final          Passed - Patient is not pregnant      Passed - Valid encounter within last 12 months    Recent Outpatient Visits           8 months ago Annual physical exam   [facility] Glean Hess, MD   9 months ago Encounter for medication review   [place], RPH-CPP   1 year ago Annual physical exam   [facility] Glean Hess, MD   2 years ago Annual physical exam   [facility] Glean Hess, MD       Future Appointments             In 1 month Ralene Bathe, MD [place]               pantoprazole (PROTONIX) 40 MG tablet 90 tablet 1    Sig: TAKE 1 TABLET (40 MG TOTAL) BY MOUTH DAILY.      Gastroenterology: Proton Pump Inhibitors Passed - 01/27/2021  8:07 AM      Passed - Valid encounter within last 12 months    Recent Outpatient Visits           8 months ago Annual physical exam   [facility] Glean Hess, MD   9 months ago Encounter for medication review   [place], RPH-CPP   1 year ago Annual physical exam   [facility] Glean Hess, MD   2 years ago Annual physical exam   [facility] Glean Hess, MD       Future Appointments             In 1 month Ralene Bathe, MD [place]

## 2021-01-29 ENCOUNTER — Other Ambulatory Visit: Payer: Self-pay

## 2021-01-29 ENCOUNTER — Ambulatory Visit
Admission: RE | Admit: 2021-01-29 | Discharge: 2021-01-29 | Disposition: A | Discharge status: Home / Self Care | Payer: 59 | Source: Ambulatory Visit | Attending: Gastroenterology | Admitting: Gastroenterology

## 2021-01-29 DIAGNOSIS — R109 Unspecified abdominal pain: Secondary | ICD-10-CM | POA: Diagnosis not present

## 2021-01-29 DIAGNOSIS — R1031 Right lower quadrant pain: Secondary | ICD-10-CM

## 2021-01-29 MED ORDER — IOHEXOL 300 MG/ML  SOLN
100.0000 mL | Freq: Once | INTRAMUSCULAR | Status: AC | PRN
  Administered 2021-01-29: 100 mL via INTRAVENOUS

## 2021-02-12 ENCOUNTER — Other Ambulatory Visit (HOSPITAL_COMMUNITY): Payer: Self-pay

## 2021-02-12 MED FILL — Dupilumab Subcutaneous Soln Prefilled Syringe 300 MG/2ML: SUBCUTANEOUS | 28 days supply | Qty: 4 | Fill #1 | Status: AC

## 2021-02-21 ENCOUNTER — Other Ambulatory Visit (HOSPITAL_COMMUNITY): Payer: Self-pay

## 2021-03-12 ENCOUNTER — Other Ambulatory Visit: Payer: Self-pay

## 2021-03-12 DIAGNOSIS — I25118 Atherosclerotic heart disease of native coronary artery with other forms of angina pectoris: Secondary | ICD-10-CM | POA: Diagnosis not present

## 2021-03-12 DIAGNOSIS — R0602 Shortness of breath: Secondary | ICD-10-CM | POA: Diagnosis not present

## 2021-03-12 MED ORDER — ROSUVASTATIN CALCIUM 40 MG PO TABS
ORAL_TABLET | ORAL | 4 refills | Status: DC
  Filled 2021-03-12: qty 90, 90d supply, fill #0

## 2021-03-17 DIAGNOSIS — E785 Hyperlipidemia, unspecified: Secondary | ICD-10-CM | POA: Diagnosis not present

## 2021-03-17 DIAGNOSIS — Z7902 Long term (current) use of antithrombotics/antiplatelets: Secondary | ICD-10-CM | POA: Diagnosis not present

## 2021-03-17 DIAGNOSIS — Z01810 Encounter for preprocedural cardiovascular examination: Secondary | ICD-10-CM | POA: Diagnosis not present

## 2021-03-17 DIAGNOSIS — Z7982 Long term (current) use of aspirin: Secondary | ICD-10-CM | POA: Diagnosis not present

## 2021-03-17 DIAGNOSIS — I25118 Atherosclerotic heart disease of native coronary artery with other forms of angina pectoris: Secondary | ICD-10-CM | POA: Diagnosis not present

## 2021-03-17 DIAGNOSIS — Z95820 Peripheral vascular angioplasty status with implants and grafts: Secondary | ICD-10-CM | POA: Insufficient documentation

## 2021-03-17 DIAGNOSIS — Z955 Presence of coronary angioplasty implant and graft: Secondary | ICD-10-CM | POA: Diagnosis not present

## 2021-03-18 ENCOUNTER — Other Ambulatory Visit (HOSPITAL_COMMUNITY): Payer: Self-pay

## 2021-03-19 ENCOUNTER — Other Ambulatory Visit (HOSPITAL_COMMUNITY): Payer: Self-pay

## 2021-03-19 ENCOUNTER — Ambulatory Visit: Payer: 59 | Admitting: Oncology

## 2021-03-19 MED FILL — Dupilumab Subcutaneous Soln Prefilled Syringe 300 MG/2ML: SUBCUTANEOUS | 28 days supply | Qty: 4 | Fill #2 | Status: AC

## 2021-03-21 ENCOUNTER — Other Ambulatory Visit (HOSPITAL_COMMUNITY): Payer: Self-pay

## 2021-03-26 ENCOUNTER — Ambulatory Visit: Payer: 59 | Admitting: Dermatology

## 2021-03-26 ENCOUNTER — Other Ambulatory Visit: Payer: Self-pay

## 2021-03-26 DIAGNOSIS — Z1283 Encounter for screening for malignant neoplasm of skin: Secondary | ICD-10-CM | POA: Diagnosis not present

## 2021-03-26 DIAGNOSIS — Z85820 Personal history of malignant melanoma of skin: Secondary | ICD-10-CM

## 2021-03-26 DIAGNOSIS — D1800 Hemangioma unspecified site: Secondary | ICD-10-CM

## 2021-03-26 DIAGNOSIS — L814 Other melanin hyperpigmentation: Secondary | ICD-10-CM

## 2021-03-26 DIAGNOSIS — L821 Other seborrheic keratosis: Secondary | ICD-10-CM | POA: Diagnosis not present

## 2021-03-26 DIAGNOSIS — Z86018 Personal history of other benign neoplasm: Secondary | ICD-10-CM

## 2021-03-26 DIAGNOSIS — L578 Other skin changes due to chronic exposure to nonionizing radiation: Secondary | ICD-10-CM | POA: Diagnosis not present

## 2021-03-26 DIAGNOSIS — L570 Actinic keratosis: Secondary | ICD-10-CM | POA: Diagnosis not present

## 2021-03-26 DIAGNOSIS — D229 Melanocytic nevi, unspecified: Secondary | ICD-10-CM

## 2021-03-26 DIAGNOSIS — L2089 Other atopic dermatitis: Secondary | ICD-10-CM | POA: Diagnosis not present

## 2021-03-26 DIAGNOSIS — Z85828 Personal history of other malignant neoplasm of skin: Secondary | ICD-10-CM | POA: Diagnosis not present

## 2021-03-26 MED ORDER — OPZELURA 1.5 % EX CREA
1.0000 "application " | TOPICAL_CREAM | Freq: Two times a day (BID) | CUTANEOUS | 2 refills | Status: DC
  Filled 2021-03-26: qty 60, 30d supply, fill #0

## 2021-03-26 NOTE — Progress Notes (Signed)
Follow-Up Visit   Subjective  Kendra Salas is a 70 y.o. adult who presents for the following: Annual Exam (Mole check, hx of Melanoma, Hx of BCC, Hx of Dysplastic nevus ) and Dermatitis (Atopic dermatitis flared on Dupixent injection every 2 weeks and Triamcinolone cream ). The patient presents for Total-Body Skin Exam (TBSE) for skin cancer screening and mole check.   The following portions of the chart were reviewed this encounter and updated as appropriate:   Tobacco  Allergies  Meds  Problems  Med Hx  Surg Hx  Fam Hx      Review of Systems:  No other skin or systemic complaints except as noted in HPI or Assessment and Plan.  Objective  Well appearing patient in no apparent distress; mood and affect are within normal limits.  A full examination was performed including scalp, head, eyes, ears, nose, lips, neck, chest, axillae, abdomen, back, buttocks, bilateral upper extremities, bilateral lower extremities, hands, feet, fingers, toes, fingernails, and toenails. All findings within normal limits unless otherwise noted below.  arms Scattered crust on the elbows  face x 7 (7) Erythematous thin papules/macules with gritty scale.   Right Breast Well healed scar with no evidence of recurrence, no lymphadenopathy.    Assessment & Plan  Other atopic dermatitis arms  Atopic dermatitis - Severe, on Dupixent (biologic medication).  Atopic dermatitis (eczema) is a chronic, relapsing, pruritic condition that can significantly affect quality of life. It is often associated with allergic rhinitis and/or asthma and can require treatment with topical medications, phototherapy, or in severe cases a biologic medication called Dupixent.   Continue Dupixent 300mg /23mL SQ QOW,  Start Opzelura cream apply to affected skin bid    Discussed Rinvoq and new atopic dermatitis injection Adbry, but patient is pleased with the current treatment plan.  Related Medications Ruxolitinib Phosphate  (OPZELURA) 1.5 % CREA Apply 1 application topically 2 (two) times daily.  AK (actinic keratosis) (7) face x 7  Destruction of lesion - face x 7 Complexity: simple   Destruction method: cryotherapy   Informed consent: discussed and consent obtained   Timeout:  patient name, date of birth, surgical site, and procedure verified Lesion destroyed using liquid nitrogen: Yes   Region frozen until ice ball extended beyond lesion: Yes   Outcome: patient tolerated procedure well with no complications   Post-procedure details: wound care instructions given    History of melanoma Right Breast No lymphadenopathy. Clear. Observe for recurrence. Call clinic for new or changing lesions.  Recommend regular skin exams, daily broad-spectrum spf 30+ sunscreen use, and photoprotection.     Skin cancer screening  Lentigines - Scattered tan macules - Due to sun exposure - Benign-appering, observe - Recommend daily broad spectrum sunscreen SPF 30+ to sun-exposed areas, reapply every 2 hours as needed. - Call for any changes  Seborrheic Keratoses - Stuck-on, waxy, tan-brown papules and/or plaques  - Benign-appearing - Discussed benign etiology and prognosis. - Observe - Call for any changes  Melanocytic Nevi - Tan-brown and/or pink-flesh-colored symmetric macules and papules - Benign appearing on exam today - Observation - Call clinic for new or changing moles - Recommend daily use of broad spectrum spf 30+ sunscreen to sun-exposed areas.   Hemangiomas - Red papules - Discussed benign nature - Observe - Call for any changes  Actinic Damage - Chronic condition, secondary to cumulative UV/sun exposure - diffuse scaly erythematous macules with underlying dyspigmentation - Recommend daily broad spectrum sunscreen SPF 30+ to sun-exposed areas,  reapply every 2 hours as needed.  - Staying in the shade or wearing long sleeves, sun glasses (UVA+UVB protection) and wide brim hats (4-inch brim  around the entire circumference of the hat) are also recommended for sun protection.  - Call for new or changing lesions.  History of Dysplastic Nevi Multiple see history - No evidence of recurrence today - Recommend regular full body skin exams - Recommend daily broad spectrum sunscreen SPF 30+ to sun-exposed areas, reapply every 2 hours as needed.  - Call if any new or changing lesions are noted between office visits   History of Basal Cell Carcinoma of the Skin Multiple see history  - No evidence of recurrence today - Recommend regular full body skin exams - Recommend daily broad spectrum sunscreen SPF 30+ to sun-exposed areas, reapply every 2 hours as needed.  - Call if any new or changing lesions are noted between office visits  Skin cancer screening performed today.   Return in about 6 months (around 09/25/2021) for TBSE, Hx of Melanoma.  IMarye Round, CMA, am acting as scribe for Sarina Ser, MD .  Documentation: I have reviewed the above documentation for accuracy and completeness, and I agree with the above.  Sarina Ser, MD

## 2021-03-26 NOTE — Patient Instructions (Signed)

## 2021-03-27 ENCOUNTER — Other Ambulatory Visit: Payer: 59

## 2021-03-27 ENCOUNTER — Ambulatory Visit: Payer: 59 | Admitting: Oncology

## 2021-03-30 ENCOUNTER — Other Ambulatory Visit: Payer: Self-pay

## 2021-03-30 DIAGNOSIS — C8334 Diffuse large B-cell lymphoma, lymph nodes of axilla and upper limb: Secondary | ICD-10-CM

## 2021-03-31 ENCOUNTER — Other Ambulatory Visit: Payer: Self-pay

## 2021-04-01 ENCOUNTER — Encounter: Payer: Self-pay | Admitting: Dermatology

## 2021-04-02 ENCOUNTER — Other Ambulatory Visit: Payer: Self-pay

## 2021-04-02 ENCOUNTER — Encounter: Payer: Self-pay | Admitting: Oncology

## 2021-04-02 ENCOUNTER — Inpatient Hospital Stay: Payer: 59 | Attending: Oncology

## 2021-04-02 ENCOUNTER — Inpatient Hospital Stay: Payer: 59 | Admitting: Oncology

## 2021-04-02 VITALS — BP 153/100 | HR 70 | Temp 97.4°F | Resp 18 | Wt 197.3 lb

## 2021-04-02 DIAGNOSIS — Z803 Family history of malignant neoplasm of breast: Secondary | ICD-10-CM | POA: Insufficient documentation

## 2021-04-02 DIAGNOSIS — I2510 Atherosclerotic heart disease of native coronary artery without angina pectoris: Secondary | ICD-10-CM | POA: Diagnosis not present

## 2021-04-02 DIAGNOSIS — Z86018 Personal history of other benign neoplasm: Secondary | ICD-10-CM | POA: Diagnosis not present

## 2021-04-02 DIAGNOSIS — Z8579 Personal history of other malignant neoplasms of lymphoid, hematopoietic and related tissues: Secondary | ICD-10-CM | POA: Diagnosis not present

## 2021-04-02 DIAGNOSIS — Z79899 Other long term (current) drug therapy: Secondary | ICD-10-CM | POA: Diagnosis not present

## 2021-04-02 DIAGNOSIS — Z8572 Personal history of non-Hodgkin lymphomas: Secondary | ICD-10-CM | POA: Insufficient documentation

## 2021-04-02 DIAGNOSIS — E785 Hyperlipidemia, unspecified: Secondary | ICD-10-CM | POA: Diagnosis not present

## 2021-04-02 DIAGNOSIS — Z08 Encounter for follow-up examination after completed treatment for malignant neoplasm: Secondary | ICD-10-CM

## 2021-04-02 DIAGNOSIS — Z9221 Personal history of antineoplastic chemotherapy: Secondary | ICD-10-CM | POA: Insufficient documentation

## 2021-04-02 DIAGNOSIS — Z85828 Personal history of other malignant neoplasm of skin: Secondary | ICD-10-CM | POA: Diagnosis not present

## 2021-04-02 DIAGNOSIS — C8334 Diffuse large B-cell lymphoma, lymph nodes of axilla and upper limb: Secondary | ICD-10-CM

## 2021-04-02 LAB — CBC WITH DIFFERENTIAL/PLATELET
Abs Immature Granulocytes: 0.02 10*3/uL (ref 0.00–0.07)
Basophils Absolute: 0 10*3/uL (ref 0.0–0.1)
Basophils Relative: 1 %
Eosinophils Absolute: 0.1 10*3/uL (ref 0.0–0.5)
Eosinophils Relative: 1 %
HCT: 37.8 % (ref 36.0–46.0)
Hemoglobin: 12.7 g/dL (ref 12.0–15.0)
Immature Granulocytes: 0 %
Lymphocytes Relative: 23 %
Lymphs Abs: 1.6 10*3/uL (ref 0.7–4.0)
MCH: 27.6 pg (ref 26.0–34.0)
MCHC: 33.6 g/dL (ref 30.0–36.0)
MCV: 82.2 fL (ref 80.0–100.0)
Monocytes Absolute: 0.6 10*3/uL (ref 0.1–1.0)
Monocytes Relative: 10 %
Neutro Abs: 4.4 10*3/uL (ref 1.7–7.7)
Neutrophils Relative %: 65 %
Platelets: 244 10*3/uL (ref 150–400)
RBC: 4.6 MIL/uL (ref 3.87–5.11)
RDW: 13.3 % (ref 11.5–15.5)
WBC: 6.7 10*3/uL (ref 4.0–10.5)
nRBC: 0 % (ref 0.0–0.2)

## 2021-04-02 LAB — COMPREHENSIVE METABOLIC PANEL
ALT: 18 U/L (ref 0–44)
AST: 19 U/L (ref 15–41)
Albumin: 3.9 g/dL (ref 3.5–5.0)
Alkaline Phosphatase: 47 U/L (ref 38–126)
Anion gap: 6 (ref 5–15)
BUN: 14 mg/dL (ref 8–23)
CO2: 25 mmol/L (ref 22–32)
Calcium: 8.7 mg/dL — ABNORMAL LOW (ref 8.9–10.3)
Chloride: 105 mmol/L (ref 98–111)
Creatinine, Ser: 0.79 mg/dL (ref 0.44–1.00)
GFR, Estimated: >60 mL/min (ref >60)
Glucose, Bld: 121 mg/dL — ABNORMAL HIGH (ref 70–99)
Potassium: 3.9 mmol/L (ref 3.5–5.1)
Sodium: 136 mmol/L (ref 135–145)
Total Bilirubin: 0.5 mg/dL (ref 0.3–1.2)
Total Protein: 7.5 g/dL (ref 6.5–8.1)

## 2021-04-02 LAB — URIC ACID: Uric Acid, Serum: 3.6 mg/dL (ref 2.5–7.1)

## 2021-04-02 LAB — LACTATE DEHYDROGENASE: LDH: 96 U/L — ABNORMAL LOW (ref 98–192)

## 2021-04-02 NOTE — Progress Notes (Signed)
Hematology/Oncology Consult note [facility]  [phone] [phone]  Patient Care Team: Glean Hess, MD as PCP - General (Internal Medicine) Corey Skains, MD as Consulting Physician (Cardiology) Lucilla Lame, MD as Consulting Physician (Gastroenterology) Ralene Bathe, MD (Dermatology) Sindy Guadeloupe, MD as Consulting Physician (Oncology)   Name of the patient: Kendra Salas  [id number]  11-Feb-1951   Date of visit: 04/02/21  Diagnosis-history of diffuse large B-cell lymphoma in 2015 now in CR 1  Chief complaint/ Reason for visit-routine follow-up of diffuse large B-cell lymphoma  Heme/Onc history: Patient is a 70 year old female with history of stage IIIb diffuse large B-cell lymphoma diagnosed in September 2015 on right axillary lymph node biopsy.Pathology revealed CD20+ high grade B-cell lymphoma best classified as diffuse large B cell lymphoma, activated B-cell type with CD5 coexpression.  FISH studies for MYC and BCL2 were negative.  BCL6 was positive.  Bone marrow was negative by report.   PET scan on 06/13/2014 revealed hypermetabolic left neck lymph node at the level of the angle of  the jaw consistent with metastasis.   There was a hypermetabolic mass beneath the right pectoralis muscle consistent with metastasis. There was hypermetabolic nodule along the pleural space adjacent to the heart and aorta consistent with metastatic implant.  There was enlargement of the spleen with a hypermetabolic mass consistent with metastatic disease.   There were several indeterminate lesions.  There was a small focus of activity along the ascending colon as well as activity anterior to the bladder at site of prior surgery.   She received 6 cycles of RCHOP from 06/2014 - 10/2014.     PET scan on 09/24/2014 revealed a completed response.  PET scan on 12/03/2015 revealed no evidence of residual or recurrent hypermetabolic lymphoma. There  was age advanced coronary artery atherosclerosis.  Patient has coronary artery disease and follows up with cardiology and also recently underwent stent placement.  She is on aspirin and Plavix.  Also has a history of atopic dermatitis  Interval history: She is doing well since her heart cath.  She is on aspirin and Plavix.  Denies any changes in her appetite.  No unintentional weight loss.  She did have some discomfort/possible swelling around her xiphoid after the procedure which has now resolved.  ECOG PS- 0 Pain scale-0  Review of systems- Review of Systems  Constitutional:  Negative for chills, fever, malaise/fatigue and weight loss.  HENT:  Negative for congestion, ear discharge and nosebleeds.   Eyes:  Negative for blurred vision.  Respiratory:  Negative for cough, hemoptysis, sputum production, shortness of breath and wheezing.   Cardiovascular:  Negative for chest pain, palpitations, orthopnea and claudication.  Gastrointestinal:  Negative for abdominal pain, blood in stool, constipation, diarrhea, heartburn, melena, nausea and vomiting.  Genitourinary:  Negative for dysuria, flank pain, frequency, hematuria and urgency.  Musculoskeletal:  Negative for back pain, joint pain and myalgias.  Skin:  Negative for rash.  Neurological:  Negative for dizziness, tingling, focal weakness, seizures, weakness and headaches.  Endo/Heme/Allergies:  Does not bruise/bleed easily.  Psychiatric/Behavioral:  Negative for depression and suicidal ideas. The patient does not have insomnia.       No Known Allergies   Past Medical History:  Diagnosis Date   Anxiety    Basal cell carcinoma 03/07/2008   left dorsum mid forearm   Basal cell carcinoma 04/19/2013   left distal lat ant thigh   Basal cell carcinoma 04/11/2014  right forearm, right post shoulder near deltoid   Basal cell carcinoma 10/12/2018   left mid back 7.0 cm lat to spine   CAD (coronary artery disease)    Clotting disorder  ([place])    Dysplastic nevus 02/02/2007   left volar mid to prox forearm   Dysplastic nevus 07/09/2010   right medial breast 1.5 cm med to areola, right med breast parasternal, right post distal deltoid   Eczema    GERD (gastroesophageal reflux disease)    Hyperlipemia    Hypertension    Lymphoma ([place]) 2015   Melanoma in situ ([place]) 12/22/2006   right medial breast, med sup quad   Personal history of chemotherapy      Past Surgical History:  Procedure Laterality Date   AUGMENTATION MAMMAPLASTY Bilateral 2003   CARDIAC CATHETERIZATION     X2 - has 5 stents. 4 LAD, 1 posterior, Last 3 placed 2004   COLONOSCOPY  06/29/2012   repeat 10 yrs - Dr. Candace Cruise   ESOPHAGOGASTRODUODENOSCOPY (EGD) WITH PROPOFOL N/A 11/22/2015   Procedure: ESOPHAGOGASTRODUODENOSCOPY (EGD) WITH PROPOFOL;  Surgeon: Lucilla Lame, MD;  Location: [place];  Service: Endoscopy;  Laterality: N/A;   FACIAL COSMETIC SURGERY     gender re-affirmation     HERNIA REPAIR     x2   PORTA CATH REMOVAL N/A 04/10/2019   Procedure: PORTA CATH REMOVAL;  Surgeon: Algernon Huxley, MD;  Location: [place] [facility];  Service: Cardiovascular;  Laterality: N/A;    Social History   Socioeconomic History   Marital status: Married    Spouse name: Not on file   Number of children: Not on file   Years of education: Not on file   Highest education level: Not on file  Occupational History   Not on file  Tobacco Use   Smoking status: Never   Smokeless tobacco: Never  Vaping Use   Vaping Use: Never used  Substance and Sexual Activity   Alcohol use: Yes    Alcohol/week: 1.0 standard drink    Types: 1 Shots of liquor per week   Drug use: Never   Sexual activity: Not on file  Other Topics Concern   Not on file  Social History Narrative   Not on file   Social Determinants of Health   Financial Resource Strain: Not on file  Food Insecurity: Not on file  Transportation Needs: Not on file  Physical Activity: Not on file   Stress: Not on file  Social Connections: Not on file  Intimate Partner Violence: Not on file    Family History  Problem Relation Age of Onset   Breast cancer Maternal Aunt 72     Current Outpatient Medications:    aspirin 81 MG tablet, Take 81 mg by mouth daily., Disp: , Rfl:    clopidogrel (PLAVIX) 75 MG tablet, TAKE 1 TABLET BY MOUTH DAILY., Disp: 90 tablet, Rfl: 1   COVID-19 mRNA Vac-TriS, Pfizer, (PFIZER-BIONT COVID-19 VAC-TRIS) SUSP injection, Inject into the muscle., Disp: 0.3 mL, Rfl: 0   Crisaborole 2 % OINT, APPLY TO THE AFFECTED AREA(S) OF ECZEMA 1 TO 2 TIMES DAILY, Disp: 60 g, Rfl: 6   dupilumab (DUPIXENT) 300 MG/2ML prefilled syringe, INJECT 1 SYRINGE UNDER THE SKIN AS DIRECTED EVERY OTHER WEEK, Disp: 4 mL, Rfl: 6   estradiol (ESTRACE) 2 MG tablet, TAKE 1 TABLET BY MOUTH DAILY., Disp: 90 tablet, Rfl: 1   finasteride (PROSCAR) 5 MG tablet, TAKE 1 TABLET BY MOUTH DAILY., Disp: 90 tablet, Rfl:  1   losartan (COZAAR) 100 MG tablet, TAKE 1 TABLET BY MOUTH DAILY., Disp: 90 tablet, Rfl: 1   pantoprazole (PROTONIX) 40 MG tablet, TAKE 1 TABLET (40 MG TOTAL) BY MOUTH DAILY., Disp: 90 tablet, Rfl: 1   rosuvastatin (CRESTOR) 40 MG tablet, Take 1 tablet (40 mg total) by mouth once daily, Disp: 90 tablet, Rfl: 4   hydrochlorothiazide (MICROZIDE) 12.5 MG capsule, Take 1 capsule (12.5 mg total) by mouth daily. (Patient not taking: Reported on 04/02/2021), Disp: 90 capsule, Rfl: 1   hyoscyamine (ANASPAZ) 0.125 MG TBDP disintergrating tablet, Place 1 tablet (0.125 mg total) under the tongue every 6 (six) hours as needed. (Patient not taking: Reported on 04/02/2021), Disp: 30 tablet, Rfl: 0   nystatin cream (MYCOSTATIN), Apply to the affected area(s) 2 (two) times daily. (Patient not taking: Reported on 04/02/2021), Disp: 30 g, Rfl: 1   ondansetron (ZOFRAN) 4 MG tablet, Take 1 tablet (4 mg total) by mouth every 8 (eight) hours as needed for nausea or vomiting. (Patient not taking: Reported on  04/02/2021), Disp: 20 tablet, Rfl: 0   rosuvastatin (CRESTOR) 20 MG tablet, TAKE 1 TABLET BY MOUTH DAILY. (Patient not taking: Reported on 04/02/2021), Disp: 90 tablet, Rfl: 1   Ruxolitinib Phosphate (OPZELURA) 1.5 % CREA, Apply 1 application topically 2 (two) times daily. (Patient not taking: Reported on 04/02/2021), Disp: 60 g, Rfl: 2   triamcinolone (KENALOG) 0.1 %, APPLY TO THE AFFECTED AREA(S) 1 TO 2 TIMES DAILY AS NEEDED * AVOID FACE, GROIN, AND AXILLA (Patient not taking: Reported on 04/02/2021), Disp: 80 g, Rfl: 60   triamcinolone cream (KENALOG) 0.1 %, Apply 1 application topically 2 (two) times daily. To rash on legs (Patient not taking: Reported on 04/02/2021), Disp: 453.6 g, Rfl: 3 No current facility-administered medications for this visit.  Facility-Administered Medications Ordered in Other Visits:    heparin lock flush 100 unit/mL, 500 Units, Intravenous, Once, Choksi, Janak, MD   sodium chloride 0.9 % injection 10 mL, 10 mL, Intravenous, PRN, Forest Gleason, MD  Physical exam:  Vitals:   04/02/21 1422  BP: (!) 153/100  Pulse: 70  Resp: 18  Temp: (!) 97.4 F (36.3 C)  SpO2: 97%  Weight: 197 lb 5 oz (89.5 kg)   Physical Exam Cardiovascular:     Rate and Rhythm: Normal rate and regular rhythm.     Heart sounds: Normal heart sounds.  Pulmonary:     Effort: Pulmonary effort is normal.     Breath sounds: Normal breath sounds.  Abdominal:     General: Bowel sounds are normal.     Palpations: Abdomen is soft.  Lymphadenopathy:     Comments: No palpable cervical, supraclavicular, axillary or inguinal adenopathy.  There is mild prominence of the left submandibular soft tissue but no palpable lymphadenopathy    Skin:    General: Skin is warm and dry.  Neurological:     Mental Status: She is alert and oriented to person, place, and time.     CMP Latest Ref Rng & Units 04/02/2021  Glucose 70 - 99 mg/dL 121(H)  BUN 8 - 23 mg/dL 14  Creatinine 0.44 - 1.00 mg/dL 0.79  Sodium  135 - 145 mmol/L 136  Potassium 3.5 - 5.1 mmol/L 3.9  Chloride 98 - 111 mmol/L 105  CO2 22 - 32 mmol/L 25  Calcium 8.9 - 10.3 mg/dL 8.7(L)  Total Protein 6.5 - 8.1 g/dL 7.5  Total Bilirubin 0.3 - 1.2 mg/dL 0.5  Alkaline Phos 38 - 126  U/L 47  AST 15 - 41 U/L 19  ALT 0 - 44 U/L 18   CBC Latest Ref Rng & Units 04/02/2021  WBC 4.0 - 10.5 K/uL 6.7  Hemoglobin 12.0 - 15.0 g/dL 12.7  Hematocrit 36.0 - 46.0 % 37.8  Platelets 150 - 400 K/uL 244     Assessment and plan- Patient is a 71 y.o. adult with history of stage IIIb diffuse large B-cell lymphoma in 2015 s/p 6 cycles of R-CHOP currently in CR 1 and this is a routine follow-up visit  Clinically patient is doing well with no concerning signs and symptoms of recurrence based on today's exam.  Labs including CBC with CMP and LDH are unremarkable as well.  There is some prominence of the right submandibular gland but I do not palpate any significant lymphadenopathy.  Dr. Ronnald Ramp is aware that she would keep an eye on it and if she feels it is getting more prominent she will let us know and we will arrange for an ultrasound.  Prognosis of dlbcl after 5 years is usually excellent.  I will see her back in 1 year with CBC with differential CMP LDH and uric acid   Visit Diagnosis 1. Encounter for follow-up surveillance of diffuse large B-cell lymphoma      Dr. Randa Evens, MD, MPH [facility] [phone] 04/02/2021 3:39 PM

## 2021-04-03 ENCOUNTER — Other Ambulatory Visit: Payer: Self-pay

## 2021-04-03 DIAGNOSIS — L2089 Other atopic dermatitis: Secondary | ICD-10-CM

## 2021-04-03 MED ORDER — OPZELURA 1.5 % EX CREA: 1.0000 "application " | TOPICAL_CREAM | Freq: Two times a day (BID) | CUTANEOUS | 2 refills | Status: DC

## 2021-04-03 NOTE — Progress Notes (Signed)
Opzelura not covered at [facility]. Switched to [place] for [employer].

## 2021-04-08 ENCOUNTER — Telehealth: Payer: Self-pay | Admitting: Pharmacist

## 2021-04-08 NOTE — Telephone Encounter (Signed)
Called patient to schedule an appointment for the [employer] [facility] [employer] [facility]. I was unable to reach the patient so I left a HIPAA-compliant message requesting that the patient return my call.   Luke Van Ausdall, PharmD, BCACP, CPP Clinical Pharmacist [facility] [phone]  

## 2021-04-15 ENCOUNTER — Other Ambulatory Visit (HOSPITAL_COMMUNITY): Payer: Self-pay

## 2021-04-18 ENCOUNTER — Other Ambulatory Visit (HOSPITAL_COMMUNITY): Payer: Self-pay

## 2021-04-21 ENCOUNTER — Other Ambulatory Visit (HOSPITAL_COMMUNITY): Payer: Self-pay

## 2021-04-21 MED FILL — Dupilumab Subcutaneous Soln Prefilled Syringe 300 MG/2ML: SUBCUTANEOUS | 28 days supply | Qty: 4 | Fill #3 | Status: AC

## 2021-04-29 ENCOUNTER — Other Ambulatory Visit: Payer: Self-pay

## 2021-04-29 DIAGNOSIS — E782 Mixed hyperlipidemia: Secondary | ICD-10-CM | POA: Diagnosis not present

## 2021-04-29 DIAGNOSIS — I2510 Atherosclerotic heart disease of native coronary artery without angina pectoris: Secondary | ICD-10-CM | POA: Diagnosis not present

## 2021-04-29 MED ORDER — VALSARTAN 160 MG PO TABS
ORAL_TABLET | ORAL | 4 refills | Status: DC
  Filled 2021-04-29: qty 90, 90d supply, fill #0

## 2021-05-07 ENCOUNTER — Other Ambulatory Visit: Payer: Self-pay | Admitting: Internal Medicine

## 2021-05-07 ENCOUNTER — Other Ambulatory Visit: Payer: Self-pay

## 2021-05-07 DIAGNOSIS — Z79890 Hormone replacement therapy (postmenopausal): Secondary | ICD-10-CM

## 2021-05-07 MED FILL — Estradiol Tab 2 MG: ORAL | 90 days supply | Qty: 90 | Fill #0 | Status: AC

## 2021-05-08 ENCOUNTER — Other Ambulatory Visit: Payer: Self-pay

## 2021-05-09 ENCOUNTER — Other Ambulatory Visit: Payer: Self-pay

## 2021-05-11 ENCOUNTER — Other Ambulatory Visit: Payer: Self-pay

## 2021-05-15 ENCOUNTER — Other Ambulatory Visit (HOSPITAL_COMMUNITY): Payer: Self-pay

## 2021-05-15 ENCOUNTER — Other Ambulatory Visit: Payer: Self-pay

## 2021-05-15 MED FILL — Dupilumab Subcutaneous Soln Prefilled Syringe 300 MG/2ML: SUBCUTANEOUS | 28 days supply | Qty: 4 | Fill #4 | Status: AC

## 2021-05-19 ENCOUNTER — Other Ambulatory Visit (HOSPITAL_COMMUNITY): Payer: Self-pay

## 2021-05-29 ENCOUNTER — Other Ambulatory Visit: Payer: Self-pay

## 2021-06-03 ENCOUNTER — Other Ambulatory Visit: Payer: Self-pay

## 2021-06-04 ENCOUNTER — Ambulatory Visit (INDEPENDENT_AMBULATORY_CARE_PROVIDER_SITE_OTHER): Payer: 59 | Admitting: Internal Medicine

## 2021-06-04 ENCOUNTER — Other Ambulatory Visit: Payer: Self-pay

## 2021-06-04 ENCOUNTER — Encounter: Payer: Self-pay | Admitting: Internal Medicine

## 2021-06-04 VITALS — BP 126/82 | HR 69 | Temp 97.9°F | Ht 72.0 in | Wt 195.0 lb

## 2021-06-04 DIAGNOSIS — Z0000 Encounter for general adult medical examination without abnormal findings: Secondary | ICD-10-CM | POA: Diagnosis not present

## 2021-06-04 DIAGNOSIS — E782 Mixed hyperlipidemia: Secondary | ICD-10-CM | POA: Diagnosis not present

## 2021-06-04 DIAGNOSIS — C8334 Diffuse large B-cell lymphoma, lymph nodes of axilla and upper limb: Secondary | ICD-10-CM

## 2021-06-04 DIAGNOSIS — Z85820 Personal history of malignant melanoma of skin: Secondary | ICD-10-CM | POA: Diagnosis not present

## 2021-06-04 DIAGNOSIS — L409 Psoriasis, unspecified: Secondary | ICD-10-CM

## 2021-06-04 DIAGNOSIS — Z23 Encounter for immunization: Secondary | ICD-10-CM | POA: Diagnosis not present

## 2021-06-04 DIAGNOSIS — I2510 Atherosclerotic heart disease of native coronary artery without angina pectoris: Secondary | ICD-10-CM

## 2021-06-04 DIAGNOSIS — Z1231 Encounter for screening mammogram for malignant neoplasm of breast: Secondary | ICD-10-CM

## 2021-06-04 DIAGNOSIS — I10 Essential (primary) hypertension: Secondary | ICD-10-CM | POA: Diagnosis not present

## 2021-06-04 NOTE — Progress Notes (Signed)
Date:  06/04/2021   Name:  Kendra Salas   DOB:  16-Jun-1951   MRN:  [id number]   Chief Complaint: Annual Exam (Breast exam ) Kendra Salas is a 70 y.o. adult who presents today for her Complete Annual Exam. She feels well. She reports exercising walking 3 days a week. She reports she is sleeping well. Breast complaints none.  Mammogram: 06/2020 DEXA: 06/2020 Normal Pap smear: discontinued Colonoscopy: 06/2012 due next year  Immunization History  Administered Date(s) Administered   Influenza-Unspecified 07/21/2016   PFIZER Comirnaty(Gray Top)Covid-19 Tri-Sucrose Vaccine 01/08/2021   PFIZER(Purple Top)SARS-COV-2 Vaccination 10/02/2019, 10/23/2019, 06/07/2020   Pneumococcal Conjugate-13 06/05/2014, 06/26/2016   Pneumococcal Polysaccharide-23 03/30/2018   Tdap 06/05/2014   Zoster Recombinat (Shingrix) 11/25/2020   Zoster, Live 04/12/2015    Hypertension This is a chronic problem. The problem is controlled. Pertinent negatives include no chest pain, headaches, palpitations or shortness of breath. Past treatments include angiotensin blockers (changed to valsartan by cardiology). The current treatment provides significant improvement. Hypertensive end-organ damage includes CAD/MI.  Hyperlipidemia This is a chronic problem. The problem is controlled. Pertinent negatives include no chest pain, myalgias or shortness of breath. Current antihyperlipidemic treatment includes statins.  Gastroesophageal Reflux She complains of heartburn. She reports no abdominal pain, no chest pain, no choking, no coughing or no wheezing. This is a recurrent problem. The problem occurs occasionally. The symptoms are aggravated by lying down and ETOH. Pertinent negatives include no fatigue. She has tried a PPI (and PRN Pepcid) for the symptoms. The treatment provided significant relief.   Lab Results  Component Value Date   CREATININE 0.79 04/02/2021   BUN 14 04/02/2021   NA 136 04/02/2021   K 3.9 04/02/2021    CL 105 04/02/2021   CO2 25 04/02/2021   Lab Results  Component Value Date   CHOL 141 01/02/2021   HDL 44 01/02/2021   LDLCALC 77 01/02/2021   TRIG 110 01/02/2021   CHOLHDL 3.2 05/04/2019   Lab Results  Component Value Date   TSH 2.65 01/02/2021   Lab Results  Component Value Date   HGBA1C 5.6 01/02/2021   Lab Results  Component Value Date   WBC 6.7 04/02/2021   HGB 12.7 04/02/2021   HCT 37.8 04/02/2021   MCV 82.2 04/02/2021   PLT 244 04/02/2021   Lab Results  Component Value Date   ALT 18 04/02/2021   AST 19 04/02/2021   ALKPHOS 47 04/02/2021   BILITOT 0.5 04/02/2021     Review of Systems  Constitutional:  Negative for appetite change, chills, diaphoresis, fatigue, fever and unexpected weight change.  HENT:  Negative for congestion, hearing loss, tinnitus, trouble swallowing and voice change.   Eyes:  Negative for visual disturbance.  Respiratory:  Negative for cough, choking, chest tightness, shortness of breath and wheezing.   Cardiovascular:  Negative for chest pain, palpitations and leg swelling.  Gastrointestinal:  Positive for heartburn. Negative for abdominal pain, blood in stool, constipation, diarrhea and vomiting.  Endocrine: Negative for polydipsia and polyuria.  Genitourinary:  Negative for difficulty urinating, dysuria, frequency, genital sores, vaginal bleeding and vaginal discharge.  Musculoskeletal:  Negative for arthralgias, back pain, gait problem, joint swelling and myalgias.  Skin:  Negative for color change and rash.  Neurological:  Negative for dizziness, tremors, syncope, light-headedness and headaches.  Hematological:  Negative for adenopathy. Does not bruise/bleed easily.  Psychiatric/Behavioral:  Negative for dysphoric mood and sleep disturbance. The patient is not nervous/anxious.    Patient  Active Problem List   Diagnosis Date Noted   S/P angioplasty with stent 03/17/2021   Hx of melanoma of skin 05/22/2020   Psoriasis 03/30/2018    Mixed hyperlipidemia 03/31/2016   Breathlessness on exertion 03/31/2016   Special screening for malignant neoplasms, colon    Benign neoplasm of ascending colon    CAD (coronary artery disease) 03/03/2015   Diverticulosis 03/03/2015   Benign essential HTN 03/03/2015   Diffuse large B-cell lymphoma of lymph nodes of axilla ([place]) 02/10/2015    No Known Allergies  Past Surgical History:  Procedure Laterality Date   AUGMENTATION MAMMAPLASTY Bilateral 2003   CARDIAC CATHETERIZATION     X2 - has 5 stents. 4 LAD, 1 posterior, Last 3 placed 2004   COLONOSCOPY  06/29/2012   repeat 10 yrs - Dr. Candace Cruise   ESOPHAGOGASTRODUODENOSCOPY (EGD) WITH PROPOFOL N/A 11/22/2015   Procedure: ESOPHAGOGASTRODUODENOSCOPY (EGD) WITH PROPOFOL;  Surgeon: Lucilla Lame, MD;  Location: [facility];  Service: Endoscopy;  Laterality: N/A;   FACIAL COSMETIC SURGERY     gender re-affirmation     HERNIA REPAIR     x2   PORTA CATH REMOVAL N/A 04/10/2019   Procedure: PORTA CATH REMOVAL;  Surgeon: Algernon Huxley, MD;  Location: [facility];  Service: Cardiovascular;  Laterality: N/A;    Social History   Tobacco Use   Smoking status: Never   Smokeless tobacco: Never  Vaping Use   Vaping Use: Never used  Substance Use Topics   Alcohol use: Yes    Alcohol/week: 1.0 standard drink    Types: 1 Shots of liquor per week   Drug use: Never     Medication list has been reviewed and updated.  Current Meds  Medication Sig   aspirin 81 MG tablet Take 81 mg by mouth daily.   clopidogrel (PLAVIX) 75 MG tablet TAKE 1 TABLET BY MOUTH DAILY.   Crisaborole 2 % OINT APPLY TO THE AFFECTED AREA(S) OF ECZEMA 1 TO 2 TIMES DAILY   estradiol (ESTRACE) 2 MG tablet TAKE 1 TABLET BY MOUTH DAILY.   finasteride (PROSCAR) 5 MG tablet TAKE 1 TABLET BY MOUTH DAILY.   hyoscyamine (ANASPAZ) 0.125 MG TBDP disintergrating tablet Place 1 tablet (0.125 mg total) under the tongue every 6 (six) hours as needed.   nystatin cream  (MYCOSTATIN) Apply to the affected area(s) 2 (two) times daily.   ondansetron (ZOFRAN) 4 MG tablet Take 1 tablet (4 mg total) by mouth every 8 (eight) hours as needed for nausea or vomiting.   pantoprazole (PROTONIX) 40 MG tablet TAKE 1 TABLET (40 MG TOTAL) BY MOUTH DAILY.   rosuvastatin (CRESTOR) 20 MG tablet TAKE 1 TABLET BY MOUTH DAILY.   Ruxolitinib Phosphate (OPZELURA) 1.5 % CREA Apply 1 application topically 2 (two) times daily.   triamcinolone (KENALOG) 0.1 % APPLY TO THE AFFECTED AREA(S) 1 TO 2 TIMES DAILY AS NEEDED * AVOID FACE, GROIN, AND AXILLA   triamcinolone cream (KENALOG) 0.1 % Apply 1 application topically 2 (two) times daily. To rash on legs   valsartan (DIOVAN) 160 MG tablet Take 1 tablet (160 mg total) by mouth once daily    PHQ 2/9 Scores 06/04/2021 05/22/2020 05/03/2019 03/30/2018  PHQ - 2 Score 0 0 0 0  PHQ- 9 Score 0 0 - -  Exception Documentation - - - Patient refusal    GAD 7 : Generalized Anxiety Score 06/04/2021 05/22/2020  Nervous, Anxious, on Edge 0 0  Control/stop worrying 0 0  Worry too much -  different things 0 0  Trouble relaxing 0 0  Restless 0 0  Easily annoyed or irritable 0 0  Afraid - awful might happen 0 0  Total GAD 7 Score 0 0  Anxiety Difficulty - Not difficult at all    BP Readings from Last 3 Encounters:  06/04/21 126/82  04/02/21 (!) 153/100  05/22/20 (!) 140/94    Physical Exam Vitals and nursing note reviewed.  Constitutional:      General: She is not in acute distress.    Appearance: She is well-developed.  HENT:     Head: Normocephalic and atraumatic.     Right Ear: Tympanic membrane and ear canal normal.     Left Ear: Tympanic membrane and ear canal normal.     Nose:     Right Sinus: No maxillary sinus tenderness.     Left Sinus: No maxillary sinus tenderness.  Eyes:     General: No scleral icterus.       Right eye: No discharge.        Left eye: No discharge.     Conjunctiva/sclera: Conjunctivae normal.  Neck:      Thyroid: No thyromegaly.     Vascular: No carotid bruit.  Cardiovascular:     Rate and Rhythm: Normal rate and regular rhythm.     Pulses: Normal pulses.     Heart sounds: Normal heart sounds.  Pulmonary:     Effort: Pulmonary effort is normal. No respiratory distress.     Breath sounds: No wheezing.  Chest:  Breasts:    Right: No mass, nipple discharge, skin change or tenderness.     Left: No mass, nipple discharge, skin change or tenderness.  Abdominal:     General: Bowel sounds are normal.     Palpations: Abdomen is soft.     Tenderness: There is no abdominal tenderness.  Genitourinary:    Prostate: Normal.     Rectum: Guaiac result negative.  Musculoskeletal:        General: Normal range of motion.     Cervical back: Normal range of motion. No erythema.     Right lower leg: No edema.     Left lower leg: No edema.  Lymphadenopathy:     Cervical: No cervical adenopathy.  Skin:    General: Skin is warm and dry.     Findings: No rash.  Neurological:     Mental Status: She is alert and oriented to person, place, and time.     Cranial Nerves: No cranial nerve deficit.     Sensory: No sensory deficit.     Deep Tendon Reflexes: Reflexes are normal and symmetric.  Psychiatric:        Attention and Perception: Attention normal.        Mood and Affect: Mood normal.    Wt Readings from Last 3 Encounters:  06/04/21 195 lb (88.5 kg)  04/02/21 197 lb 5 oz (89.5 kg)  05/22/20 196 lb (88.9 kg)    BP 126/82   Pulse 69   Temp 97.9 F (36.6 C) (Oral)   Ht 6' (1.829 m)   Wt 195 lb (88.5 kg)   SpO2 97%   BMI 26.45 kg/m   Assessment and Plan: 1. Annual physical exam Normal exam. Continue exercise, healthy diet.  2. Encounter for screening mammogram for breast cancer Schedule at [facility] in September - MM 3D SCREEN BREAST BILATERAL  3. Benign essential HTN Clinically stable exam with well controlled BP. Tolerating medications without side  effects at this time. Pt to  continue current regimen and low sodium diet; benefits of regular exercise as able discussed.  4. Coronary artery disease involving native coronary artery of native heart without angina pectoris Stable without angina since recent stent placement. BP improved with change to Valsartan.  5. Mixed hyperlipidemia Tolerating statin medication without side effects at this time LDL is at goal of < 70 on current dose Continue same therapy without change at this time.  6. Hx of melanoma of skin Follow up regularly with Dermatology  7. Diffuse large B-cell lymphoma of lymph nodes of axilla ([facility]) Continue to monitor for enlarged lymph nodes. See Oncology regularly as planned  8. Psoriasis On Dupixent injection with excellent response.  9. Need for shingles vaccine Second dose given today. - Varicella-zoster vaccine IM   Partially dictated using Editor, commissioning. Any errors are unintentional.  Halina Maidens, MD [facility]  06/04/2021

## 2021-06-20 ENCOUNTER — Other Ambulatory Visit (HOSPITAL_COMMUNITY): Payer: Self-pay

## 2021-06-20 MED FILL — Dupilumab Subcutaneous Soln Prefilled Syringe 300 MG/2ML: SUBCUTANEOUS | 28 days supply | Qty: 4 | Fill #5 | Status: AC

## 2021-06-23 ENCOUNTER — Other Ambulatory Visit (HOSPITAL_COMMUNITY): Payer: Self-pay

## 2021-06-23 ENCOUNTER — Other Ambulatory Visit: Payer: Self-pay

## 2021-06-27 ENCOUNTER — Other Ambulatory Visit: Payer: Self-pay

## 2021-06-27 ENCOUNTER — Other Ambulatory Visit: Payer: Self-pay | Admitting: Internal Medicine

## 2021-06-27 DIAGNOSIS — E782 Mixed hyperlipidemia: Secondary | ICD-10-CM

## 2021-06-27 MED ORDER — ROSUVASTATIN CALCIUM 40 MG PO TABS
40.0000 mg | ORAL_TABLET | Freq: Every day | ORAL | 1 refills | Status: DC
  Filled 2021-06-27: qty 90, 90d supply, fill #0

## 2021-07-01 ENCOUNTER — Ambulatory Visit: Payer: 59 | Attending: Internal Medicine

## 2021-07-01 ENCOUNTER — Other Ambulatory Visit: Payer: Self-pay

## 2021-07-01 DIAGNOSIS — Z23 Encounter for immunization: Secondary | ICD-10-CM

## 2021-07-01 MED ORDER — PFIZER COVID-19 VAC BIVALENT 30 MCG/0.3ML IM SUSP
INTRAMUSCULAR | 0 refills | Status: DC
  Filled 2021-07-01: qty 0.3, 1d supply, fill #0

## 2021-07-01 NOTE — Progress Notes (Signed)
   Covid-19 Vaccination Clinic  Name:  Kendra Salas    MRN: [id number] DOB: July 26, 1951  07/01/2021  Kendra Salas was observed post Covid-19 immunization for 15 minutes without incident. She was provided with Vaccine Information Sheet and instruction to access the V-Safe system.   Kendra Salas was instructed to call 911 with any severe reactions post vaccine: Difficulty breathing  Swelling of face and throat  A fast heartbeat  A bad rash all over body  Dizziness and weakness   Lu Duffel, PharmD, MBA Clinical Acute Care Pharmacist

## 2021-07-15 ENCOUNTER — Other Ambulatory Visit: Payer: Self-pay

## 2021-07-16 ENCOUNTER — Other Ambulatory Visit (HOSPITAL_COMMUNITY): Payer: Self-pay

## 2021-07-16 ENCOUNTER — Other Ambulatory Visit: Payer: Self-pay | Admitting: Dermatology

## 2021-07-18 ENCOUNTER — Other Ambulatory Visit (HOSPITAL_COMMUNITY): Payer: Self-pay

## 2021-07-22 ENCOUNTER — Other Ambulatory Visit (HOSPITAL_COMMUNITY): Payer: Self-pay

## 2021-07-22 ENCOUNTER — Other Ambulatory Visit: Payer: Self-pay | Admitting: Dermatology

## 2021-07-22 MED ORDER — DUPIXENT 300 MG/2ML ~~LOC~~ SOSY
PREFILLED_SYRINGE | SUBCUTANEOUS | 2 refills | Status: DC
  Filled 2021-07-22: qty 4, fill #0

## 2021-07-23 ENCOUNTER — Other Ambulatory Visit: Payer: Self-pay | Admitting: Pharmacist

## 2021-07-23 ENCOUNTER — Other Ambulatory Visit: Payer: Self-pay

## 2021-07-23 ENCOUNTER — Other Ambulatory Visit (HOSPITAL_COMMUNITY): Payer: Self-pay

## 2021-07-23 MED ORDER — DUPIXENT 300 MG/2ML ~~LOC~~ SOSY
PREFILLED_SYRINGE | SUBCUTANEOUS | 2 refills | Status: DC
  Filled 2021-07-23: qty 4, 28d supply, fill #0
  Filled 2021-08-14: qty 4, 28d supply, fill #1
  Filled 2021-09-10: qty 4, 28d supply, fill #2

## 2021-07-24 ENCOUNTER — Other Ambulatory Visit: Payer: Self-pay

## 2021-07-25 ENCOUNTER — Ambulatory Visit: Payer: 59 | Attending: Family Medicine | Admitting: Pharmacist

## 2021-07-25 ENCOUNTER — Other Ambulatory Visit: Payer: Self-pay

## 2021-07-25 DIAGNOSIS — Z79899 Other long term (current) drug therapy: Secondary | ICD-10-CM

## 2021-07-25 NOTE — Progress Notes (Signed)
   S: Patient is currently taking Dupixent for atopic dermatitis. Patient is managed by Dr. Nehemiah Massed for this. Had a visit with Dr. Nehemiah Massed in June of this year and Dupixent therapy was continued.   Adherence: denies any missed doses  Efficacy: reports that it is working well for her  Dosing: 300 mg every 14 days  Dose adjustments: Renal: no dose adjustments (has not been studied) Hepatic: no dose adjustments (has not been studied)  Screening: TB test: completed per patient  Monitoring: S/sx of infection: denies S/sx of hypersensitivity: denies S/sx of ocular effects: denies S/sx of eosinophilia/vasculitis: denies  O:  Lab Results  Component Value Date   WBC 6.7 04/02/2021   HGB 12.7 04/02/2021   HCT 37.8 04/02/2021   MCV 82.2 04/02/2021   PLT 244 04/02/2021      Chemistry      Component Value Date/Time   NA 136 04/02/2021 1409   NA 139 01/02/2021 0000   NA 139 10/16/2014 0845   K 3.9 04/02/2021 1409   K 3.7 10/16/2014 0845   CL 105 04/02/2021 1409   CL 107 10/16/2014 0845   CO2 25 04/02/2021 1409   CO2 25 10/16/2014 0845   BUN 14 04/02/2021 1409   BUN 12 01/02/2021 0000   BUN 11 10/16/2014 0845   CREATININE 0.79 04/02/2021 1409   CREATININE 0.85 10/16/2014 0845   GLU 91 01/02/2021 0000      Component Value Date/Time   CALCIUM 8.7 (L) 04/02/2021 1409   CALCIUM 8.1 (L) 10/16/2014 0845   ALKPHOS 47 04/02/2021 1409   ALKPHOS 47 10/16/2014 0845   AST 19 04/02/2021 1409   AST 23 10/16/2014 0845   ALT 18 04/02/2021 1409   ALT 18 10/16/2014 0845   BILITOT 0.5 04/02/2021 1409   BILITOT 0.5 05/04/2019 0803   BILITOT 0.3 10/16/2014 0845       A/P: 1. Medication review: Patient currently on [place] for eczema and is tolerating it well. Reviewed the medication with the patient, including the following: Dupixent is a monoclonal antibody used for the treatment of asthma or atopic dermatitis. Patient educated on purpose, proper use and potential adverse  effects of Dupixent. Possible adverse effects include increased risk of infection, ocular effects, vasculitis/eosinophilia, and hypersensitivity reactions. No recommendations for any changes.  Benard Halsted, PharmD, [place], [place] [phone]

## 2021-08-14 ENCOUNTER — Other Ambulatory Visit (HOSPITAL_COMMUNITY): Payer: Self-pay

## 2021-08-14 ENCOUNTER — Other Ambulatory Visit: Payer: Self-pay

## 2021-08-20 ENCOUNTER — Other Ambulatory Visit: Payer: Self-pay | Admitting: Internal Medicine

## 2021-08-20 ENCOUNTER — Other Ambulatory Visit: Payer: Self-pay

## 2021-08-20 DIAGNOSIS — K219 Gastro-esophageal reflux disease without esophagitis: Secondary | ICD-10-CM

## 2021-08-20 DIAGNOSIS — I10 Essential (primary) hypertension: Secondary | ICD-10-CM

## 2021-08-20 DIAGNOSIS — K449 Diaphragmatic hernia without obstruction or gangrene: Secondary | ICD-10-CM

## 2021-08-20 DIAGNOSIS — I25118 Atherosclerotic heart disease of native coronary artery with other forms of angina pectoris: Secondary | ICD-10-CM

## 2021-08-20 DIAGNOSIS — Z79890 Hormone replacement therapy (postmenopausal): Secondary | ICD-10-CM

## 2021-08-20 MED ORDER — ESTRADIOL 2 MG PO TABS
ORAL_TABLET | Freq: Every day | ORAL | 1 refills | Status: DC
Start: 2021-08-20 — End: 2022-03-12
  Filled 2021-08-20: qty 90, 90d supply, fill #0
  Filled 2021-11-27: qty 90, 90d supply, fill #1

## 2021-08-20 MED ORDER — VALSARTAN 160 MG PO TABS
ORAL_TABLET | ORAL | 1 refills | Status: DC
Start: 2021-08-20 — End: 2022-02-02
  Filled 2021-08-20: qty 90, 90d supply, fill #0
  Filled 2021-11-27: qty 90, 90d supply, fill #1

## 2021-08-20 MED ORDER — FINASTERIDE 5 MG PO TABS
ORAL_TABLET | Freq: Every day | ORAL | 1 refills | Status: DC
  Filled 2021-08-20: qty 90, 90d supply, fill #0
  Filled 2021-11-27: qty 90, 90d supply, fill #1

## 2021-08-20 MED ORDER — PANTOPRAZOLE SODIUM 40 MG PO TBEC
DELAYED_RELEASE_TABLET | Freq: Every day | ORAL | 1 refills | Status: DC
  Filled 2021-08-20: qty 90, 90d supply, fill #0
  Filled 2021-11-27: qty 90, 90d supply, fill #1

## 2021-08-20 MED ORDER — CLOPIDOGREL BISULFATE 75 MG PO TABS
ORAL_TABLET | Freq: Every day | ORAL | 1 refills | Status: DC
  Filled 2021-08-20: qty 90, 90d supply, fill #0
  Filled 2021-11-27: qty 90, 90d supply, fill #1

## 2021-08-26 ENCOUNTER — Other Ambulatory Visit: Payer: Self-pay

## 2021-09-01 ENCOUNTER — Other Ambulatory Visit: Payer: Self-pay

## 2021-09-01 DIAGNOSIS — B3731 Acute candidiasis of vulva and vagina: Secondary | ICD-10-CM

## 2021-09-01 MED ORDER — NYSTATIN 100000 UNIT/GM EX CREA: 1.0000 "application " | TOPICAL_CREAM | Freq: Two times a day (BID) | CUTANEOUS | 1 refills | Status: DC

## 2021-09-10 ENCOUNTER — Other Ambulatory Visit (HOSPITAL_COMMUNITY): Payer: Self-pay

## 2021-09-19 ENCOUNTER — Other Ambulatory Visit (HOSPITAL_COMMUNITY): Payer: Self-pay

## 2021-09-22 ENCOUNTER — Other Ambulatory Visit (HOSPITAL_COMMUNITY): Payer: Self-pay

## 2021-09-24 ENCOUNTER — Ambulatory Visit: Payer: 59 | Admitting: Dermatology

## 2021-09-24 ENCOUNTER — Other Ambulatory Visit: Payer: Self-pay

## 2021-09-24 DIAGNOSIS — Z85828 Personal history of other malignant neoplasm of skin: Secondary | ICD-10-CM

## 2021-09-24 DIAGNOSIS — L649 Androgenic alopecia, unspecified: Secondary | ICD-10-CM

## 2021-09-24 DIAGNOSIS — L821 Other seborrheic keratosis: Secondary | ICD-10-CM

## 2021-09-24 DIAGNOSIS — L2089 Other atopic dermatitis: Secondary | ICD-10-CM

## 2021-09-24 DIAGNOSIS — L570 Actinic keratosis: Secondary | ICD-10-CM | POA: Diagnosis not present

## 2021-09-24 DIAGNOSIS — Z1283 Encounter for screening for malignant neoplasm of skin: Secondary | ICD-10-CM

## 2021-09-24 DIAGNOSIS — D1800 Hemangioma unspecified site: Secondary | ICD-10-CM

## 2021-09-24 DIAGNOSIS — L578 Other skin changes due to chronic exposure to nonionizing radiation: Secondary | ICD-10-CM

## 2021-09-24 DIAGNOSIS — D229 Melanocytic nevi, unspecified: Secondary | ICD-10-CM | POA: Diagnosis not present

## 2021-09-24 DIAGNOSIS — I781 Nevus, non-neoplastic: Secondary | ICD-10-CM

## 2021-09-24 DIAGNOSIS — Z85820 Personal history of malignant melanoma of skin: Secondary | ICD-10-CM

## 2021-09-24 DIAGNOSIS — L814 Other melanin hyperpigmentation: Secondary | ICD-10-CM

## 2021-09-24 NOTE — Patient Instructions (Signed)

## 2021-09-24 NOTE — Progress Notes (Signed)
Follow-Up Visit   Subjective  Kendra Salas is a 70 y.o. adult who presents for the following: Annual Exam (Hx MM, dysplastic nevi, BCC - ). Hx of eczema patient is currently using Dupixent 300mg /33mL SQ QOW, Opzelura cream QD alternating with TMC 0.1% PRN, and has Eucrisa ointment if she needs it. The patient presents for Total-Body Skin Exam (TBSE) for skin cancer screening and mole check.  The patient has spots, moles and lesions to be evaluated, some may be new or changing.  The following portions of the chart were reviewed this encounter and updated as appropriate:   Tobacco   Allergies   Meds   Problems   Med Hx   Surg Hx   Fam Hx      Review of Systems:  No other skin or systemic complaints except as noted in HPI or Assessment and Plan.  Objective  Well appearing patient in no apparent distress; mood and affect are within normal limits.  A full examination was performed including scalp, head, eyes, ears, nose, lips, neck, chest, axillae, abdomen, back, buttocks, bilateral upper extremities, bilateral lower extremities, hands, feet, fingers, toes, fingernails, and toenails. All findings within normal limits unless otherwise noted below.  Trunk, extremities Pink patches with excoriations on the L buttocks, arms, and R post shoulder.  Med sup edge of BCC excision site x 1, L lat forehead x 1, R cheek x 2 (4) Erythematous thin papules/macules with gritty scale.   Face Dilated vessels.  Scalp Areas of thinning.   Assessment & Plan  Atopic dermatitis -with pruritus, severe but much better controlled on Dupixent injections and topicals Trunk, extremities  Atopic dermatitis (eczema) is a chronic, relapsing, pruritic condition that can significantly affect quality of life. It is often associated with allergic rhinitis and/or asthma and can require treatment with topical medications, phototherapy, or in severe cases biologic injectable medication (Dupixent; Adbry) or Oral JAK  inhibitors.  Discussed Rinvoq since she is not totally clear with Dupixent. Patient prefers to continue Vista and topical treatment.   Continue Dupixent 300mg /37mL SQ QOW, Opzelura cream QD-BID PRN, and TMC 0.1% cream QD-BID PRN. Topical steroids (such as triamcinolone, fluocinolone, fluocinonide, mometasone, clobetasol, halobetasol, betamethasone, hydrocortisone) can cause thinning and lightening of the skin if they are used for too long in the same area. Your physician has selected the right strength medicine for your problem and area affected on the body. Please use your medication only as directed by your physician to prevent side effects.   Related Medications Ruxolitinib Phosphate (OPZELURA) 1.5 % CREA Apply 1 application topically 2 (two) times daily.  AK (actinic keratosis) (4) Med sup edge of BCC excision site x 1, L lat forehead x 1, R cheek x 2 Destruction of lesion - Med sup edge of BCC excision site x 1, L lat forehead x 1, R cheek x 2 Complexity: simple   Destruction method: cryotherapy   Informed consent: discussed and consent obtained   Timeout:  patient name, date of birth, surgical site, and procedure verified Lesion destroyed using liquid nitrogen: Yes   Region frozen until ice ball extended beyond lesion: Yes   Outcome: patient tolerated procedure well with no complications   Post-procedure details: wound care instructions given    Telangiectasias Face Benign-appearing.  Observation.  Call clinic for new or changing lesions.  Recommend daily use of broad spectrum spf 30+ sunscreen to sun-exposed areas.  Discussed the treatment option of BBL/laser.  Typically we recommend 1-3 treatment sessions about  5-8 weeks apart for best results.  The patient's condition may require "maintenance treatments" in the future.  The fee for BBL / laser treatments is $350 per treatment session for the whole face.  A fee can be quoted for other parts of the body. Insurance typically does  not pay for BBL/laser treatments and therefore the fee is an out-of-pocket cost.  Androgenic alopecia Scalp Chronic and persistent.   Discussed oral Minoxidil patient declines treatment at this time as she is treating with OTC topical Minoxidil and satisfied with treatment.  Skin cancer screening                                                                 History of Basal Cell Carcinoma of the Skin - No evidence of recurrence today - Recommend regular full body skin exams - Recommend daily broad spectrum sunscreen SPF 30+ to sun-exposed areas, reapply every 2 hours as needed.  - Call if any new or changing lesions are noted between office visits  History of Melanoma - No evidence of recurrence today - No lymphadenopathy - Recommend regular full body skin exams - Recommend daily broad spectrum sunscreen SPF 30+ to sun-exposed areas, reapply every 2 hours as needed.  - Call if any new or changing lesions are noted between office visits  Lentigines - Scattered tan macules - Due to sun exposure - Benign-appearing, observe - Recommend daily broad spectrum sunscreen SPF 30+ to sun-exposed areas, reapply every 2 hours as needed. - Call for any changes  Seborrheic Keratoses - Stuck-on, waxy, tan-brown papules and/or plaques  - Benign-appearing - Discussed benign etiology and prognosis. - Observe - Call for any changes  Melanocytic Nevi - Tan-brown and/or pink-flesh-colored symmetric macules and papules - Benign appearing on exam today - Observation - Call clinic for new or changing moles - Recommend daily use of broad spectrum spf 30+ sunscreen to sun-exposed areas.   Hemangiomas - Red papules - Discussed benign nature - Observe - Call for any changes  Actinic Damage - Chronic condition, secondary to cumulative UV/sun exposure - diffuse scaly erythematous macules with underlying dyspigmentation - Recommend daily broad spectrum sunscreen SPF 30+ to sun-exposed areas,  reapply every 2 hours as needed.  - Staying in the shade or wearing long sleeves, sun glasses (UVA+UVB protection) and wide brim hats (4-inch brim around the entire circumference of the hat) are also recommended for sun protection.  - Call for new or changing lesions.  Skin cancer screening performed today.  Return in about 6 months (around 03/25/2022) for TBSE.  Luther Redo, CMA, am acting as scribe for Sarina Ser, MD . Documentation: I have reviewed the above documentation for accuracy and completeness, and I agree with the above.  Sarina Ser, MD

## 2021-10-05 ENCOUNTER — Encounter: Payer: Self-pay | Admitting: Dermatology

## 2021-10-13 ENCOUNTER — Other Ambulatory Visit (HOSPITAL_COMMUNITY): Payer: Self-pay

## 2021-10-13 ENCOUNTER — Other Ambulatory Visit: Payer: Self-pay | Admitting: Dermatology

## 2021-10-14 ENCOUNTER — Other Ambulatory Visit (HOSPITAL_COMMUNITY): Payer: Self-pay

## 2021-10-14 ENCOUNTER — Other Ambulatory Visit: Payer: Self-pay | Admitting: Pharmacist

## 2021-10-14 ENCOUNTER — Other Ambulatory Visit: Payer: Self-pay

## 2021-10-14 ENCOUNTER — Ambulatory Visit
Admission: RE | Admit: 2021-10-14 | Discharge: 2021-10-14 | Disposition: A | Discharge status: Home / Self Care | Payer: 59 | Source: Ambulatory Visit | Attending: Internal Medicine | Admitting: Internal Medicine

## 2021-10-14 DIAGNOSIS — Z1231 Encounter for screening mammogram for malignant neoplasm of breast: Secondary | ICD-10-CM | POA: Diagnosis not present

## 2021-10-14 DIAGNOSIS — R0789 Other chest pain: Secondary | ICD-10-CM | POA: Diagnosis not present

## 2021-10-14 DIAGNOSIS — I25118 Atherosclerotic heart disease of native coronary artery with other forms of angina pectoris: Secondary | ICD-10-CM | POA: Diagnosis not present

## 2021-10-14 MED ORDER — ISOSORBIDE MONONITRATE ER 30 MG PO TB24
ORAL_TABLET | ORAL | 11 refills | Status: DC
  Filled 2021-10-14: qty 30, 30d supply, fill #0

## 2021-10-14 MED ORDER — NITROGLYCERIN 0.4 MG SL SUBL
SUBLINGUAL_TABLET | SUBLINGUAL | 11 refills | Status: DC
  Filled 2021-10-14: qty 25, 7d supply, fill #0

## 2021-10-14 MED ORDER — DUPIXENT 300 MG/2ML ~~LOC~~ SOSY
PREFILLED_SYRINGE | SUBCUTANEOUS | 6 refills | Status: DC
  Filled 2021-10-14: qty 4, fill #0

## 2021-10-14 MED ORDER — DUPIXENT 300 MG/2ML ~~LOC~~ SOSY
PREFILLED_SYRINGE | SUBCUTANEOUS | 6 refills | Status: DC
  Filled 2021-10-14: qty 4, 28d supply, fill #0
  Filled 2021-11-18: qty 4, 28d supply, fill #1
  Filled 2021-12-24 (×2): qty 4, 28d supply, fill #2
  Filled 2022-01-21: qty 4, 28d supply, fill #3
  Filled 2022-02-27: qty 4, 28d supply, fill #4
  Filled 2022-03-31: qty 4, 28d supply, fill #5
  Filled 2022-04-27: qty 4, 28d supply, fill #6

## 2021-10-21 ENCOUNTER — Other Ambulatory Visit: Payer: Self-pay

## 2021-10-21 ENCOUNTER — Ambulatory Visit: Admission: RE | Admit: 2021-10-21 | Payer: 59 | Source: Home / Self Care | Admitting: *Deleted

## 2021-10-21 ENCOUNTER — Ambulatory Visit: Payer: 59 | Admitting: Internal Medicine

## 2021-10-21 ENCOUNTER — Ambulatory Visit
Admission: RE | Admit: 2021-10-21 | Discharge: 2021-10-21 | Disposition: A | Discharge status: Home / Self Care | Payer: 59 | Source: Ambulatory Visit | Attending: Internal Medicine | Admitting: Internal Medicine

## 2021-10-21 ENCOUNTER — Ambulatory Visit
Admission: RE | Admit: 2021-10-21 | Discharge: 2021-10-21 | Disposition: A | Discharge status: Home / Self Care | Payer: 59 | Attending: Internal Medicine | Admitting: Internal Medicine

## 2021-10-21 ENCOUNTER — Encounter: Payer: Self-pay | Admitting: Internal Medicine

## 2021-10-21 VITALS — BP 142/70 | HR 72 | Ht 72.0 in | Wt 195.0 lb

## 2021-10-21 DIAGNOSIS — R051 Acute cough: Secondary | ICD-10-CM

## 2021-10-21 DIAGNOSIS — U071 COVID-19: Secondary | ICD-10-CM

## 2021-10-21 DIAGNOSIS — R059 Cough, unspecified: Secondary | ICD-10-CM | POA: Diagnosis not present

## 2021-10-21 LAB — POC COVID19 BINAXNOW: SARS Coronavirus 2 Ag: NEGATIVE

## 2021-10-21 MED ORDER — MOLNUPIRAVIR EUA 200MG CAPSULE
4.0000 | ORAL_CAPSULE | Freq: Two times a day (BID) | ORAL | 0 refills | Status: AC
  Filled 2021-10-21: qty 40, 5d supply, fill #0

## 2021-10-21 MED ORDER — GUAIFENESIN-CODEINE 100-10 MG/5ML PO SYRP
5.0000 mL | ORAL_SOLUTION | Freq: Three times a day (TID) | ORAL | 0 refills | Status: DC | PRN
  Filled 2021-10-21: qty 118, 8d supply, fill #0

## 2021-10-21 NOTE — Addendum Note (Signed)
Addended by: Glean Hess on: 10/21/2021 01:17 PM   Modules accepted: Orders

## 2021-10-21 NOTE — Progress Notes (Signed)
Date:  10/21/2021   Name:  Kendra Salas   DOB:  08-15-1951   MRN:  [id number]   Chief Complaint: Cough  Cough This is a new problem. The current episode started yesterday. The problem has been gradually worsening. The problem occurs every few minutes. The cough is Non-productive. Pertinent negatives include no chest pain, chills, fever, sore throat (resolved), shortness of breath or wheezing. The symptoms are aggravated by exercise. She has tried nothing for the symptoms.   Lab Results  Component Value Date   NA 136 04/02/2021   K 3.9 04/02/2021   CO2 25 04/02/2021   GLUCOSE 121 (H) 04/02/2021   BUN 14 04/02/2021   CREATININE 0.79 04/02/2021   CALCIUM 8.7 (L) 04/02/2021   GFRNONAA >60 04/02/2021   Lab Results  Component Value Date   CHOL 141 01/02/2021   HDL 44 01/02/2021   LDLCALC 77 01/02/2021   TRIG 110 01/02/2021   CHOLHDL 3.2 05/04/2019   Lab Results  Component Value Date   TSH 2.65 01/02/2021   Lab Results  Component Value Date   HGBA1C 5.6 01/02/2021   Lab Results  Component Value Date   WBC 6.7 04/02/2021   HGB 12.7 04/02/2021   HCT 37.8 04/02/2021   MCV 82.2 04/02/2021   PLT 244 04/02/2021   Lab Results  Component Value Date   ALT 18 04/02/2021   AST 19 04/02/2021   ALKPHOS 47 04/02/2021   BILITOT 0.5 04/02/2021   No results found for: 25OHVITD2, 25OHVITD3, VD25OH   Review of Systems  Constitutional:  Positive for fatigue. Negative for chills and fever.  HENT:  Positive for sinus pressure. Negative for sore throat (resolved).   Respiratory:  Positive for cough. Negative for shortness of breath and wheezing.   Cardiovascular:  Negative for chest pain.  Gastrointestinal:  Negative for diarrhea and vomiting.  Psychiatric/Behavioral:  Negative for sleep disturbance.    Patient Active Problem List   Diagnosis Date Noted   S/P angioplasty with stent 03/17/2021   Hx of melanoma of skin 05/22/2020   Psoriasis 03/30/2018   Mixed hyperlipidemia  03/31/2016   Breathlessness on exertion 03/31/2016   Special screening for malignant neoplasms, colon    Benign neoplasm of ascending colon    CAD (coronary artery disease) 03/03/2015   Diverticulosis 03/03/2015   Benign essential HTN 03/03/2015   Diffuse large B-cell lymphoma of lymph nodes of axilla ([place]) 02/10/2015    No Known Allergies  Past Surgical History:  Procedure Laterality Date   AUGMENTATION MAMMAPLASTY Bilateral 2003   CARDIAC CATHETERIZATION     X2 - has 5 stents. 4 LAD, 1 posterior, Last 3 placed 2004   COLONOSCOPY  06/29/2012   repeat 10 yrs - Dr. Candace Cruise   ESOPHAGOGASTRODUODENOSCOPY (EGD) WITH PROPOFOL N/A 11/22/2015   Procedure: ESOPHAGOGASTRODUODENOSCOPY (EGD) WITH PROPOFOL;  Surgeon: Lucilla Lame, MD;  Location: [place];  Service: Endoscopy;  Laterality: N/A;   FACIAL COSMETIC SURGERY     gender re-affirmation     HERNIA REPAIR     x2   PORTA CATH REMOVAL N/A 04/10/2019   Procedure: PORTA CATH REMOVAL;  Surgeon: Algernon Huxley, MD;  Location: [place] [facility];  Service: Cardiovascular;  Laterality: N/A;    Social History   Tobacco Use   Smoking status: Never   Smokeless tobacco: Never  Vaping Use   Vaping Use: Never used  Substance Use Topics   Alcohol use: Yes    Alcohol/week: 1.0 standard drink  Types: 1 Shots of liquor per week   Drug use: Never     Medication list has been reviewed and updated.  No outpatient medications have been marked as taking for the 10/21/21 encounter (Office Visit) with Glean Hess, MD.    [facility] 2/9 Scores 06/04/2021 05/22/2020 05/03/2019 03/30/2018  PHQ - 2 Score 0 0 0 0  PHQ- 9 Score 0 0 - -  Exception Documentation - - - Patient refusal    GAD 7 : Generalized Anxiety Score 06/04/2021 05/22/2020  Nervous, Anxious, on Edge 0 0  Control/stop worrying 0 0  Worry too much - different things 0 0  Trouble relaxing 0 0  Restless 0 0  Easily annoyed or irritable 0 0  Afraid - awful might happen 0 0  Total  GAD 7 Score 0 0  Anxiety Difficulty - Not difficult at all    BP Readings from Last 3 Encounters:  10/21/21 (!) 142/70  06/04/21 126/82  04/02/21 (!) 153/100    Physical Exam Vitals and nursing note reviewed.  Constitutional:      General: She is not in acute distress.    Appearance: She is well-developed. She is ill-appearing.  HENT:     Head: Normocephalic and atraumatic.  Cardiovascular:     Rate and Rhythm: Normal rate and regular rhythm.  Pulmonary:     Effort: Pulmonary effort is normal. No respiratory distress.     Breath sounds: Examination of the left-middle field reveals decreased breath sounds. Decreased breath sounds present. No wheezing or rhonchi.  Skin:    General: Skin is warm and dry.     Findings: No rash.  Neurological:     Mental Status: She is alert and oriented to person, place, and time.  Psychiatric:        Mood and Affect: Mood normal.        Behavior: Behavior normal.    Wt Readings from Last 3 Encounters:  10/21/21 195 lb (88.5 kg)  06/04/21 195 lb (88.5 kg)  04/02/21 197 lb 5 oz (89.5 kg)    BP (!) 142/70    Pulse 72    Ht 6' (1.829 m)    Wt 195 lb (88.5 kg)    SpO2 96%    BMI 26.45 kg/m   Assessment and Plan: 1. Acute cough With abnormal lung exam. Covid positive.  Will get CXR to rule out pneumonia. - POC COVID-19 BinaxNow - positive - DG Chest 2 View - negative for infiltrate   Partially dictated using Dragon software. Any errors are unintentional.  Halina Maidens, MD [facility]  10/21/2021

## 2021-10-21 NOTE — Progress Notes (Signed)
Date:  10/21/2021   Name:  Kendra Salas   DOB:  [birth year]   MRN:  [id number]   Chief Complaint: Cough  HPI  Lab Results  Component Value Date   NA 136 04/02/2021   K 3.9 04/02/2021   CO2 25 04/02/2021   GLUCOSE 121 (H) 04/02/2021   BUN 14 04/02/2021   CREATININE 0.79 04/02/2021   CALCIUM 8.7 (L) 04/02/2021   GFRNONAA >60 04/02/2021   Lab Results  Component Value Date   CHOL 141 01/02/2021   HDL 44 01/02/2021   LDLCALC 77 01/02/2021   TRIG 110 01/02/2021   CHOLHDL 3.2 05/04/2019   Lab Results  Component Value Date   TSH 2.65 01/02/2021   Lab Results  Component Value Date   HGBA1C 5.6 01/02/2021   Lab Results  Component Value Date   WBC 6.7 04/02/2021   HGB 12.7 04/02/2021   HCT 37.8 04/02/2021   MCV 82.2 04/02/2021   PLT 244 04/02/2021   Lab Results  Component Value Date   ALT 18 04/02/2021   AST 19 04/02/2021   ALKPHOS 47 04/02/2021   BILITOT 0.5 04/02/2021   No results found for: 25OHVITD2, 25OHVITD3, VD25OH   Review of Systems  Patient Active Problem List   Diagnosis Date Noted   S/P angioplasty with stent 03/17/2021   Hx of melanoma of skin 05/22/2020   Psoriasis 03/30/2018   Mixed hyperlipidemia 03/31/2016   Breathlessness on exertion 03/31/2016   Special screening for malignant neoplasms, colon    Benign neoplasm of ascending colon    CAD (coronary artery disease) 03/03/2015   Diverticulosis 03/03/2015   Benign essential HTN 03/03/2015   Diffuse large B-cell lymphoma of lymph nodes of axilla ([place]) 02/10/2015    No Known Allergies  Past Surgical History:  Procedure Laterality Date   AUGMENTATION MAMMAPLASTY Bilateral 2003   CARDIAC CATHETERIZATION     X2 - has 5 stents. 4 LAD, 1 posterior, Last 3 placed 2004   COLONOSCOPY  06/29/2012   repeat 10 yrs - Dr. Candace Cruise   ESOPHAGOGASTRODUODENOSCOPY (EGD) WITH PROPOFOL N/A 11/22/2015   Procedure: ESOPHAGOGASTRODUODENOSCOPY (EGD) WITH PROPOFOL;  Surgeon: Lucilla Lame, MD;  Location: [place];  Service: Endoscopy;  Laterality: N/A;   FACIAL COSMETIC SURGERY     gender re-affirmation     HERNIA REPAIR     x2   PORTA CATH REMOVAL N/A 04/10/2019   Procedure: PORTA CATH REMOVAL;  Surgeon: Algernon Huxley, MD;  Location: [place] [facility];  Service: Cardiovascular;  Laterality: N/A;    Social History   Tobacco Use   Smoking status: Never   Smokeless tobacco: Never  Vaping Use   Vaping Use: Never used  Substance Use Topics   Alcohol use: Yes    Alcohol/week: 1.0 standard drink    Types: 1 Shots of liquor per week   Drug use: Never     Medication list has been reviewed and updated.  No outpatient medications have been marked as taking for the 10/21/21 encounter (Office Visit) with Glean Hess, MD.    [facility] 2/9 Scores 06/04/2021 05/22/2020 05/03/2019 03/30/2018  PHQ - 2 Score 0 0 0 0  PHQ- 9 Score 0 0 - -  Exception Documentation - - - Patient refusal    GAD 7 : Generalized Anxiety Score 06/04/2021 05/22/2020  Nervous, Anxious, on Edge 0 0  Control/stop worrying 0 0  Worry too much - different things 0 0  Trouble relaxing 0 0  Restless 0 0  Easily annoyed or irritable 0 0  Afraid - awful might happen 0 0  Total GAD 7 Score 0 0  Anxiety Difficulty - Not difficult at all    BP Readings from Last 3 Encounters:  06/04/21 126/82  04/02/21 (!) 153/100  05/22/20 (!) 140/94    Physical Exam  Wt Readings from Last 3 Encounters:  06/04/21 195 lb (88.5 kg)  04/02/21 197 lb 5 oz (89.5 kg)  05/22/20 196 lb (88.9 kg)    There were no vitals taken for this visit.  Assessment and Plan:

## 2021-10-21 NOTE — Addendum Note (Signed)
Addended by: Clista Bernhardt on: 10/21/2021 02:02 PM   Modules accepted: Orders

## 2021-10-22 LAB — COVID-19, FLU A+B AND RSV
Influenza A, NAA: NOT DETECTED
Influenza B, NAA: NOT DETECTED
RSV, NAA: NOT DETECTED
SARS-CoV-2, NAA: NOT DETECTED

## 2021-10-24 ENCOUNTER — Other Ambulatory Visit (HOSPITAL_COMMUNITY): Payer: Self-pay

## 2021-10-27 DIAGNOSIS — I25118 Atherosclerotic heart disease of native coronary artery with other forms of angina pectoris: Secondary | ICD-10-CM | POA: Diagnosis not present

## 2021-10-29 ENCOUNTER — Other Ambulatory Visit: Payer: Self-pay

## 2021-10-29 DIAGNOSIS — E782 Mixed hyperlipidemia: Secondary | ICD-10-CM | POA: Diagnosis not present

## 2021-10-29 DIAGNOSIS — R0602 Shortness of breath: Secondary | ICD-10-CM | POA: Diagnosis not present

## 2021-10-29 DIAGNOSIS — I25118 Atherosclerotic heart disease of native coronary artery with other forms of angina pectoris: Secondary | ICD-10-CM | POA: Diagnosis not present

## 2021-10-29 MED ORDER — AMLODIPINE BESYLATE 5 MG PO TABS
ORAL_TABLET | ORAL | 4 refills | Status: DC
  Filled 2021-10-29: qty 90, 90d supply, fill #0

## 2021-10-29 MED ORDER — ROSUVASTATIN CALCIUM 40 MG PO TABS
ORAL_TABLET | ORAL | 4 refills | Status: DC
  Filled 2021-10-29: qty 90, 90d supply, fill #0
  Filled 2022-02-09: qty 90, 90d supply, fill #1
  Filled 2022-05-18: qty 90, 90d supply, fill #2
  Filled 2022-08-24: qty 90, 90d supply, fill #3

## 2021-11-07 ENCOUNTER — Other Ambulatory Visit (HOSPITAL_COMMUNITY): Payer: Self-pay

## 2021-11-07 ENCOUNTER — Other Ambulatory Visit: Payer: Self-pay

## 2021-11-10 ENCOUNTER — Other Ambulatory Visit: Payer: Self-pay

## 2021-11-18 ENCOUNTER — Other Ambulatory Visit (HOSPITAL_COMMUNITY): Payer: Self-pay

## 2021-11-19 ENCOUNTER — Other Ambulatory Visit (HOSPITAL_COMMUNITY): Payer: Self-pay

## 2021-11-27 ENCOUNTER — Other Ambulatory Visit: Payer: Self-pay

## 2021-12-01 ENCOUNTER — Ambulatory Visit: Payer: 59

## 2021-12-01 ENCOUNTER — Other Ambulatory Visit (HOSPITAL_COMMUNITY): Payer: Self-pay

## 2021-12-11 ENCOUNTER — Other Ambulatory Visit: Payer: Self-pay | Admitting: Internal Medicine

## 2021-12-17 ENCOUNTER — Other Ambulatory Visit: Payer: Self-pay

## 2021-12-23 ENCOUNTER — Other Ambulatory Visit (HOSPITAL_COMMUNITY): Payer: Self-pay

## 2021-12-24 ENCOUNTER — Other Ambulatory Visit (HOSPITAL_COMMUNITY): Payer: Self-pay

## 2021-12-30 ENCOUNTER — Other Ambulatory Visit (HOSPITAL_COMMUNITY): Payer: Self-pay

## 2022-01-05 LAB — BASIC METABOLIC PANEL
BUN: 12 (ref 4–21)
CO2: 19 (ref 13–22)
Chloride: 104 (ref 99–108)
Creatinine: 0.9
Glucose: 92
Potassium: 3.9 mEq/L (ref 3.5–5.1)
Sodium: 138 (ref 137–147)

## 2022-01-05 LAB — HEMOGLOBIN A1C: Hemoglobin A1C: 5.6

## 2022-01-05 LAB — HEPATIC FUNCTION PANEL
ALT: 15 U/L
AST: 20
Alkaline Phosphatase: 50 (ref 25–125)
Bilirubin, Total: 0.4

## 2022-01-05 LAB — TSH: TSH: 2.3 (ref 0.41–5.90)

## 2022-01-05 LAB — CBC AND DIFFERENTIAL
HCT: 39 (ref 39–52)
Hemoglobin: 12.9 — AB (ref 13.0–17.0)
Platelets: 254 10*3/uL (ref 150–400)
WBC: 6.6

## 2022-01-05 LAB — LIPID PANEL
Cholesterol: 130 (ref 0–200)
HDL: 47 (ref 35–70)
LDL Cholesterol: 65
Triglycerides: 95 (ref 40–160)

## 2022-01-05 LAB — COMPREHENSIVE METABOLIC PANEL
Calcium: 8.7 (ref 8.7–10.7)
eGFR: 69

## 2022-01-05 LAB — PSA: PSA: 0.1

## 2022-01-07 ENCOUNTER — Other Ambulatory Visit: Payer: Self-pay

## 2022-01-13 ENCOUNTER — Other Ambulatory Visit: Payer: Self-pay

## 2022-01-21 ENCOUNTER — Other Ambulatory Visit (HOSPITAL_COMMUNITY): Payer: Self-pay

## 2022-01-22 ENCOUNTER — Encounter: Payer: Self-pay | Admitting: Internal Medicine

## 2022-02-02 ENCOUNTER — Other Ambulatory Visit: Payer: Self-pay | Admitting: Internal Medicine

## 2022-02-02 ENCOUNTER — Other Ambulatory Visit: Payer: Self-pay

## 2022-02-02 DIAGNOSIS — I10 Essential (primary) hypertension: Secondary | ICD-10-CM

## 2022-02-02 DIAGNOSIS — I25118 Atherosclerotic heart disease of native coronary artery with other forms of angina pectoris: Secondary | ICD-10-CM

## 2022-02-02 MED ORDER — ISOSORBIDE MONONITRATE ER 30 MG PO TB24
ORAL_TABLET | ORAL | 3 refills | Status: DC
  Filled 2022-02-02: qty 90, 90d supply, fill #0
  Filled 2022-05-18: qty 90, 90d supply, fill #1
  Filled 2022-08-24: qty 90, 90d supply, fill #2
  Filled 2022-12-02: qty 90, 90d supply, fill #3

## 2022-02-02 MED ORDER — VALSARTAN 160 MG PO TABS
ORAL_TABLET | ORAL | 3 refills | Status: DC
  Filled 2022-02-02: qty 90, 90d supply, fill #0
  Filled 2022-06-11: qty 90, 90d supply, fill #1
  Filled 2022-09-29: qty 90, 90d supply, fill #2
  Filled 2023-01-13: qty 18, 18d supply, fill #3
  Filled 2023-01-13: qty 72, 72d supply, fill #3

## 2022-02-03 ENCOUNTER — Ambulatory Visit (INDEPENDENT_AMBULATORY_CARE_PROVIDER_SITE_OTHER): Payer: 59

## 2022-02-03 ENCOUNTER — Other Ambulatory Visit (HOSPITAL_COMMUNITY): Payer: Self-pay

## 2022-02-03 VITALS — Ht 72.0 in | Wt 195.0 lb

## 2022-02-03 DIAGNOSIS — Z0000 Encounter for general adult medical examination without abnormal findings: Secondary | ICD-10-CM | POA: Diagnosis not present

## 2022-02-03 NOTE — Progress Notes (Signed)
? ?Subjective:  ? Kendra Salas is a 71 y.o. female who presents for an Initial Medicare Annual Wellness Visit. ? ?I connected with  [name] on 02/03/22 by an in person visit  and I verified that I am speaking with the correct person using two identifiers. ? ?Patient Location: Other:  Work ? ?Provider Location: Home Office ? ?I discussed the limitations of evaluation and management by telemedicine. The patient expressed understanding and agreed to proceed.  ? ?Review of Systems    ?Defer to PCP ?  ? ?   ?Objective:  ?  ?Today's Vitals  ? 02/03/22 1509 02/03/22 1510  ?Weight: 195 lb (88.5 kg)   ?Height: 6' (1.829 m)   ?PainSc: 0-No pain 0-No pain  ? ?Body mass index is 26.45 kg/m?. ? ? ?  03/27/2020  ?  1:56 PM 04/10/2019  ?  7:27 AM 03/15/2019  ?  3:23 PM 01/11/2018  ? 11:35 AM 07/06/2017  ?  1:50 PM 09/22/2016  ? 11:18 AM 04/01/2016  ?  3:07 PM  ?Advanced Directives  ?Does Patient Have a Medical Advance Directive? No Yes Yes No No Yes Yes  ?Type of Scientist, physiological of [place];Living will   Living will;Healthcare Power of Attorney   ?Does patient want to make changes to medical advance directive?  No - Patient declined       ?Copy of Needles in Chart?   No - copy requested      ?Would patient like information on creating a medical advance directive? No - Patient declined   No - Patient declined No - Patient declined    ? ? ?Current Medications (verified) ?Outpatient Encounter Medications as of 02/03/2022  ?Medication Sig  ? amLODipine (NORVASC) 5 MG tablet Take 1 tablet (5 mg total) by mouth once daily  ? aspirin 81 MG tablet Take 81 mg by mouth daily.  ? clopidogrel (PLAVIX) 75 MG tablet TAKE 1 TABLET BY MOUTH DAILY.  ? dupilumab (DUPIXENT) 300 MG/2ML prefilled syringe INJECT 1 SYRINGE UNDER THE SKIN AS DIRECTED EVERY OTHER WEEK  ? estradiol (ESTRACE) 2 MG tablet TAKE 1 TABLET BY MOUTH DAILY.  ? finasteride (PROSCAR) 5 MG tablet TAKE 1 TABLET BY MOUTH DAILY.  ?  guaiFENesin-codeine (ROBITUSSIN AC) 100-10 MG/5ML syrup Take 5 mLs by mouth 3 (three) times daily as needed for cough.  ? hyoscyamine (ANASPAZ) 0.125 MG TBDP disintergrating tablet Place 1 tablet (0.125 mg total) under the tongue every 6 (six) hours as needed.  ? isosorbide mononitrate (IMDUR) 30 MG 24 hr tablet Take 1 tablet (30 mg total) by mouth once daily  ? nitroGLYCERIN (NITROSTAT) 0.4 MG SL tablet Place 1 tablet (0.4 mg total) under the tongue every 5 (five) minutes as needed for chest pain. May take up to 3 doses.  ? nitroGLYCERIN (NITROSTAT) 0.4 MG SL tablet Place under the tongue.  ? nystatin cream (MYCOSTATIN) Apply to the affected area(s) 2 (two) times daily.  ? ondansetron (ZOFRAN) 4 MG tablet Take 1 tablet (4 mg total) by mouth every 8 (eight) hours as needed for nausea or vomiting.  ? pantoprazole (PROTONIX) 40 MG tablet TAKE 1 TABLET (40 MG TOTAL) BY MOUTH DAILY.  ? rosuvastatin (CRESTOR) 40 MG tablet Take 1 tablet (40 mg total) by mouth daily.  ? rosuvastatin (CRESTOR) 40 MG tablet Take 1 tablet (40 mg total) by mouth once daily  ? Ruxolitinib Phosphate (OPZELURA) 1.5 % CREA Apply 1 application topically 2 (two)  times daily.  ? triamcinolone cream (KENALOG) 0.1 % Apply 1 application topically 2 (two) times daily. To rash on legs  ? valsartan (DIOVAN) 160 MG tablet Take 1 tablet (160 mg total) by mouth once daily  ? [DISCONTINUED] COVID-19 mRNA bivalent vaccine, Pfizer, (PFIZER COVID-19 VAC BIVALENT) injection Inject into the muscle. (Patient not taking: Reported on 02/03/2022)  ? [DISCONTINUED] COVID-19 mRNA Vac-TriS, Pfizer, (PFIZER-BIONT COVID-19 VAC-TRIS) SUSP injection Inject into the muscle. (Patient not taking: Reported on 02/03/2022)  ? ?Facility-Administered Encounter Medications as of 02/03/2022  ?Medication  ? heparin lock flush 100 unit/mL  ? sodium chloride 0.9 % injection 10 mL  ? ? ?Allergies (verified) ?Patient has no known allergies.  ? ?History: ?Past Medical History:  ?Diagnosis Date  ?  Anxiety   ? Basal cell carcinoma 03/07/2008  ? left dorsum mid forearm  ? Basal cell carcinoma 04/19/2013  ? left distal lat ant thigh  ? Basal cell carcinoma 04/11/2014  ? right forearm, right post shoulder near deltoid  ? Basal cell carcinoma 10/12/2018  ? left mid back 7.0 cm lat to spine  ? CAD (coronary artery disease)   ? Clotting disorder ([place])   ? Dysplastic nevus 02/02/2007  ? left volar mid to prox forearm  ? Dysplastic nevus 07/09/2010  ? right medial breast 1.5 cm med to areola, right med breast parasternal, right post distal deltoid  ? Eczema   ? GERD (gastroesophageal reflux disease)   ? Hyperlipemia   ? Hypertension   ? Lymphoma ([place]) 2015  ? Melanoma in situ ([place]) 12/22/2006  ? right medial breast, med sup quad  ? Personal history of chemotherapy   ? ?Past Surgical History:  ?Procedure Laterality Date  ? AUGMENTATION MAMMAPLASTY Bilateral 2003  ? CARDIAC CATHETERIZATION    ? X2 - has 5 stents. 4 LAD, 1 posterior, Last 3 placed 2004  ? COLONOSCOPY  06/29/2012  ? repeat 10 yrs - Dr. Candace Cruise  ? ESOPHAGOGASTRODUODENOSCOPY (EGD) WITH PROPOFOL N/A 11/22/2015  ? Procedure: ESOPHAGOGASTRODUODENOSCOPY (EGD) WITH PROPOFOL;  Surgeon: Lucilla Lame, MD;  Location: [place];  Service: Endoscopy;  Laterality: N/A;  ? FACIAL COSMETIC SURGERY    ? gender re-affirmation    ? HERNIA REPAIR    ? x2  ? PORTA CATH REMOVAL N/A 04/10/2019  ? Procedure: PORTA CATH REMOVAL;  Surgeon: Algernon Huxley, MD;  Location: [place] [facility];  Service: Cardiovascular;  Laterality: N/A;  ? ?Family History  ?Problem Relation Age of Onset  ? Breast cancer Maternal Aunt 72  ? ?Social History  ? ?Socioeconomic History  ? Marital status: Married  ?  Spouse name: Not on file  ? Number of children: Not on file  ? Years of education: Not on file  ? Highest education level: Not on file  ?Occupational History  ? Not on file  ?Tobacco Use  ? Smoking status: Never  ? Smokeless tobacco: Never  ?Vaping Use  ? Vaping Use: Never used  ?Substance and  Sexual Activity  ? Alcohol use: Yes  ?  Alcohol/week: 1.0 standard drink  ?  Types: 1 Shots of liquor per week  ? Drug use: Never  ? Sexual activity: Not on file  ?Other Topics Concern  ? Not on file  ?Social History Narrative  ? Not on file  ? ?Social Determinants of Health  ? ?Financial Resource Strain: Not on file  ?Food Insecurity: No Food Insecurity  ? Worried About Charity fundraiser in the Last Year: Never  true  ? Ran Out of Food in the Last Year: Never true  ?Transportation Needs: No Transportation Needs  ? Lack of Transportation (Medical): No  ? Lack of Transportation (Non-Medical): No  ?Physical Activity: Insufficiently Active  ? Days of Exercise per Week: 2 days  ? Minutes of Exercise per Session: 60 min  ?Stress: Stress Concern Present  ? Feeling of Stress : Very much  ?Social Connections: Moderately Integrated  ? Frequency of Communication with Friends and Family: More than three times a week  ? Frequency of Social Gatherings with Friends and Family: More than three times a week  ? Attends Religious Services: 1 to 4 times per year  ? Active Member of Clubs or Organizations: No  ? Attends Archivist Meetings: Never  ? Marital Status: Married  ? ? ?Tobacco Counseling ?Counseling given: Yes ? ? ?Clinical Intake: ? ?Pre-visit preparation completed: Yes ? ?Pain : No/denies pain ?Pain Score: 0-No pain ? ?  ? ?BMI - recorded: 26.45 ?Nutritional Status: BMI 25 -29 Overweight ?Nutritional Risks: None ?Diabetes: No ? ?How often do you need to have someone help you when you read instructions, pamphlets, or other written materials from your doctor or pharmacy?: 1 - Never ? ?Diabetic?No ? ?  ? ?Information entered by :: Wyatt Haste, CMA ? ? ?Activities of Daily Living ? ?  02/03/2022  ?  3:14 PM 06/04/2021  ? 11:12 AM  ?In your present state of health, do you have any difficulty performing the following activities:  ?Hearing? 0 0  ?Vision? 0 0  ?Difficulty concentrating or making decisions? 0 0  ?Walking  or climbing stairs? 0 0  ?Dressing or bathing? 0 0  ?Doing errands, shopping? 0 0  ? ? ?Patient Care Team: ?Glean Hess, MD as PCP - General (Internal Medicine) ?Corey Skains, MD as Consulting Physici

## 2022-02-09 ENCOUNTER — Other Ambulatory Visit: Payer: Self-pay

## 2022-02-12 ENCOUNTER — Other Ambulatory Visit: Payer: Self-pay

## 2022-02-12 MED ORDER — AMLODIPINE BESYLATE 5 MG PO TABS
ORAL_TABLET | ORAL | 4 refills | Status: DC
  Filled 2022-02-12: qty 84, 84d supply, fill #0
  Filled 2022-05-18: qty 90, 90d supply, fill #1
  Filled 2022-08-24: qty 90, 90d supply, fill #2
  Filled 2022-12-02: qty 90, 90d supply, fill #3

## 2022-02-27 ENCOUNTER — Other Ambulatory Visit: Payer: Self-pay

## 2022-02-27 ENCOUNTER — Other Ambulatory Visit (HOSPITAL_COMMUNITY): Payer: Self-pay

## 2022-03-05 ENCOUNTER — Other Ambulatory Visit (HOSPITAL_COMMUNITY): Payer: Self-pay

## 2022-03-11 ENCOUNTER — Other Ambulatory Visit: Payer: Self-pay

## 2022-03-12 ENCOUNTER — Other Ambulatory Visit: Payer: Self-pay | Admitting: Internal Medicine

## 2022-03-12 ENCOUNTER — Other Ambulatory Visit: Payer: Self-pay

## 2022-03-12 DIAGNOSIS — K449 Diaphragmatic hernia without obstruction or gangrene: Secondary | ICD-10-CM

## 2022-03-12 DIAGNOSIS — I25118 Atherosclerotic heart disease of native coronary artery with other forms of angina pectoris: Secondary | ICD-10-CM

## 2022-03-12 DIAGNOSIS — Z79890 Hormone replacement therapy (postmenopausal): Secondary | ICD-10-CM

## 2022-03-12 MED FILL — Estradiol Tab 2 MG: ORAL | 90 days supply | Qty: 90 | Fill #0 | Status: AC

## 2022-03-12 MED FILL — Finasteride Tab 5 MG: ORAL | 90 days supply | Qty: 90 | Fill #0 | Status: AC

## 2022-03-12 MED FILL — Pantoprazole Sodium EC Tab 40 MG (Base Equiv): ORAL | 90 days supply | Qty: 90 | Fill #0 | Status: AC

## 2022-03-12 MED FILL — Clopidogrel Bisulfate Tab 75 MG (Base Equiv): ORAL | 90 days supply | Qty: 90 | Fill #0 | Status: AC

## 2022-03-12 NOTE — Telephone Encounter (Signed)
Requested Prescriptions  Pending Prescriptions Disp Refills  . finasteride (PROSCAR) 5 MG tablet 90 tablet 0    Sig: TAKE 1 TABLET BY MOUTH DAILY.     Urology: 5-alpha Reductase Inhibitors Passed - 03/12/2022 10:56 AM      Passed - PSA in normal range and within 360 days    PSA  Date Value Ref Range Status  01/05/2022 0.1  Final   Prostate Specific Ag, Serum  Date Value Ref Range Status  05/04/2019 <0.1 Not Estab. ng/mL Final    Comment:    Roche ECLIA methodology. According to the American Urological Association, Serum PSA should decrease and remain at undetectable levels after radical prostatectomy. The AUA defines biochemical recurrence as an initial PSA value 0.2 ng/mL or greater followed by a subsequent confirmatory PSA value 0.2 ng/mL or greater. Values obtained with different assay methods or kits cannot be used interchangeably. Results cannot be interpreted as absolute evidence of the presence or absence of malignant disease.          Passed - Valid encounter within last 12 months    Recent Outpatient Visits          4 months ago Acute cough   [facility] Glean Hess, MD   7 months ago Encounter for medication review   [place], RPH-CPP   9 months ago Annual physical exam   [facility] Glean Hess, MD   1 year ago Annual physical exam   [facility] Glean Hess, MD   1 year ago Encounter for medication review   [place], RPH-CPP      Future Appointments            In 1 week Ralene Bathe, MD [place]           . estradiol (ESTRACE) 2 MG tablet 90 tablet 0    Sig: TAKE 1 TABLET BY MOUTH DAILY.     OB/GYN:  Estrogens Failed - 03/12/2022 10:56 AM      Failed - Last BP in normal range    BP Readings from Last 1 Encounters:  10/21/21 (!) 142/70         Passed - Mammogram is up-to-date  per Health Maintenance      Passed - Valid encounter within last 12 months    Recent Outpatient Visits          4 months ago Acute cough   [facility] Glean Hess, MD   7 months ago Encounter for medication review   [place], RPH-CPP   9 months ago Annual physical exam   [facility] Glean Hess, MD   1 year ago Annual physical exam   [facility] Glean Hess, MD   1 year ago Encounter for medication review   [place], RPH-CPP      Future Appointments            In 1 week Ralene Bathe, MD [place]           . pantoprazole (PROTONIX) 40 MG tablet 90 tablet 0    Sig: TAKE 1 TABLET (40 MG TOTAL) BY MOUTH DAILY.     Gastroenterology: Proton Pump Inhibitors Passed - 03/12/2022 10:56 AM  Passed - Valid encounter within last 12 months    Recent Outpatient Visits          4 months ago Acute cough   [facility] Glean Hess, MD   7 months ago Encounter for medication review   [place], RPH-CPP   9 months ago Annual physical exam   [facility] Glean Hess, MD   1 year ago Annual physical exam   [facility] Glean Hess, MD   1 year ago Encounter for medication review   [place], RPH-CPP      Future Appointments            In 1 week Ralene Bathe, MD [place]           . clopidogrel (PLAVIX) 75 MG tablet 90 tablet 0    Sig: Take 1 tablet (75 mg total) by mouth daily. OFFICE VISIT NEEDED FOR ADDITIONAL REFILLS     Hematology: Antiplatelets - clopidogrel Failed - 03/12/2022 10:56 AM      Failed - HGB in normal range and within 180 days    Hemoglobin  Date Value Ref Range Status  01/05/2022 12.9 (A) 13.0 - 17.0 Final   HGB   Date Value Ref Range Status  11/06/2014 11.4 (L) 12.0 - 16.0 g/dL Final         Passed - HCT in normal range and within 180 days    HCT  Date Value Ref Range Status  01/05/2022 39 39 - 52 Final  11/06/2014 33.4 (L) 35.0 - 47.0 % Final         Passed - PLT in normal range and within 180 days    Platelets  Date Value Ref Range Status  01/05/2022 254 150 - 400 K/uL Final   Platelet  Date Value Ref Range Status  11/06/2014 194 150 - 440 x10 3/mm  Final         Passed - Cr in normal range and within 360 days    Creatinine  Date Value Ref Range Status  01/05/2022 0.9  Final  10/16/2014 0.85 0.60 - 1.30 mg/dL Final   Creatinine, Ser  Date Value Ref Range Status  04/02/2021 0.79 0.44 - 1.00 mg/dL Final         Passed - Valid encounter within last 6 months    Recent Outpatient Visits          4 months ago Acute cough   [facility] Glean Hess, MD   7 months ago Encounter for medication review   [place], RPH-CPP   9 months ago Annual physical exam   [facility] Glean Hess, MD   1 year ago Annual physical exam   [facility] Glean Hess, MD   1 year ago Encounter for medication review   [place], RPH-CPP      Future Appointments            In 1 week Ralene Bathe, MD [place]

## 2022-03-13 ENCOUNTER — Other Ambulatory Visit: Payer: Self-pay

## 2022-03-18 ENCOUNTER — Other Ambulatory Visit: Payer: Self-pay

## 2022-03-18 ENCOUNTER — Encounter: Payer: Self-pay | Admitting: Oncology

## 2022-03-18 ENCOUNTER — Inpatient Hospital Stay: Payer: 59 | Attending: Oncology

## 2022-03-18 ENCOUNTER — Inpatient Hospital Stay: Payer: 59 | Admitting: Oncology

## 2022-03-18 VITALS — BP 125/89 | HR 82 | Temp 97.8°F | Resp 16 | Ht 72.0 in | Wt 193.6 lb

## 2022-03-18 DIAGNOSIS — Z8579 Personal history of other malignant neoplasms of lymphoid, hematopoietic and related tissues: Secondary | ICD-10-CM

## 2022-03-18 DIAGNOSIS — Z08 Encounter for follow-up examination after completed treatment for malignant neoplasm: Secondary | ICD-10-CM

## 2022-03-18 DIAGNOSIS — Z9221 Personal history of antineoplastic chemotherapy: Secondary | ICD-10-CM | POA: Diagnosis not present

## 2022-03-18 DIAGNOSIS — Z7982 Long term (current) use of aspirin: Secondary | ICD-10-CM | POA: Insufficient documentation

## 2022-03-18 DIAGNOSIS — Z79899 Other long term (current) drug therapy: Secondary | ICD-10-CM | POA: Diagnosis not present

## 2022-03-18 DIAGNOSIS — Z8572 Personal history of non-Hodgkin lymphomas: Secondary | ICD-10-CM | POA: Diagnosis not present

## 2022-03-18 LAB — COMPREHENSIVE METABOLIC PANEL
ALT: 16 U/L (ref 0–44)
AST: 18 U/L (ref 15–41)
Albumin: 3.8 g/dL (ref 3.5–5.0)
Alkaline Phosphatase: 41 U/L (ref 38–126)
Anion gap: 8 (ref 5–15)
BUN: 13 mg/dL (ref 8–23)
CO2: 24 mmol/L (ref 22–32)
Calcium: 8.2 mg/dL — ABNORMAL LOW (ref 8.9–10.3)
Chloride: 104 mmol/L (ref 98–111)
Creatinine, Ser: 0.77 mg/dL (ref 0.44–1.00)
GFR, Estimated: >60 mL/min (ref >60)
Glucose, Bld: 93 mg/dL (ref 70–99)
Potassium: 3.6 mmol/L (ref 3.5–5.1)
Sodium: 136 mmol/L (ref 135–145)
Total Bilirubin: 0.6 mg/dL (ref 0.3–1.2)
Total Protein: 7.3 g/dL (ref 6.5–8.1)

## 2022-03-18 LAB — CBC WITH DIFFERENTIAL/PLATELET
Abs Immature Granulocytes: 0.02 10*3/uL (ref 0.00–0.07)
Basophils Absolute: 0 10*3/uL (ref 0.0–0.1)
Basophils Relative: 1 %
Eosinophils Absolute: 0.1 10*3/uL (ref 0.0–0.5)
Eosinophils Relative: 1 %
HCT: 39.1 % (ref 36.0–46.0)
Hemoglobin: 12.9 g/dL (ref 12.0–15.0)
Immature Granulocytes: 0 %
Lymphocytes Relative: 26 %
Lymphs Abs: 1.7 10*3/uL (ref 0.7–4.0)
MCH: 27.1 pg (ref 26.0–34.0)
MCHC: 33 g/dL (ref 30.0–36.0)
MCV: 82.1 fL (ref 80.0–100.0)
Monocytes Absolute: 0.7 10*3/uL (ref 0.1–1.0)
Monocytes Relative: 10 %
Neutro Abs: 4.2 10*3/uL (ref 1.7–7.7)
Neutrophils Relative %: 62 %
Platelets: 233 10*3/uL (ref 150–400)
RBC: 4.76 MIL/uL (ref 3.87–5.11)
RDW: 13.8 % (ref 11.5–15.5)
WBC: 6.7 10*3/uL (ref 4.0–10.5)
nRBC: 0 % (ref 0.0–0.2)

## 2022-03-18 LAB — LACTATE DEHYDROGENASE: LDH: 99 U/L (ref 98–192)

## 2022-03-18 LAB — URIC ACID: Uric Acid, Serum: 3.9 mg/dL (ref 2.5–7.1)

## 2022-03-18 NOTE — Progress Notes (Signed)
Hematology/Oncology Consult note [facility]  Telephone:([phone] [phone]  Patient Care Team: Glean Hess, MD as PCP - General (Internal Medicine) Corey Skains, MD as Consulting Physician (Cardiology) Lucilla Lame, MD as Consulting Physician (Gastroenterology) Ralene Bathe, MD (Dermatology) Sindy Guadeloupe, MD as Consulting Physician (Oncology)   Name of the patient: Kendra Salas  [id number]  18-Mar-1951   Date of visit: 03/18/22  Diagnosis- history of diffuse large B-cell lymphoma in 2015 now in CR 1  Chief complaint/ Reason for visit-routine follow-up of diffuse large B-cell lymphoma  Heme/Onc history: Patient is a 71 year old female with history of stage IIIb diffuse large B-cell lymphoma diagnosed in September 2015 on right axillary lymph node biopsy.Pathology revealed CD20+ high grade B-cell lymphoma best classified as diffuse large B cell lymphoma, activated B-cell type with CD5 coexpression.  FISH studies for MYC and BCL2 were negative.  BCL6 was positive.  Bone marrow was negative by report.   PET scan on 06/13/2014 revealed hypermetabolic left neck lymph node at the level of the angle of  the jaw consistent with metastasis.   There was a hypermetabolic mass beneath the right pectoralis muscle consistent with metastasis. There was hypermetabolic nodule along the pleural space adjacent to the heart and aorta consistent with metastatic implant.  There was enlargement of the spleen with a hypermetabolic mass consistent with metastatic disease.   There were several indeterminate lesions.  There was a small focus of activity along the ascending colon as well as activity anterior to the bladder at site of prior surgery.   She received 6 cycles of RCHOP from 06/2014 - 10/2014.     PET scan on 09/24/2014 revealed a completed response.  PET scan on 12/03/2015 revealed no evidence of residual or recurrent hypermetabolic lymphoma. There  was age advanced coronary artery atherosclerosis.    Interval history-patient is currently doing well and denies any specific complaints at this time.  Appetite and weight have remained stable.  Denies any unintentional weight loss, fatigue, drenching night sweats or lumps or bumps anywhere.  ECOG PS- 0 Pain scale- 0  Review of systems- Review of Systems  Constitutional:  Negative for chills, fever, malaise/fatigue and weight loss.  HENT:  Negative for congestion, ear discharge and nosebleeds.   Eyes:  Negative for blurred vision.  Respiratory:  Negative for cough, hemoptysis, sputum production, shortness of breath and wheezing.   Cardiovascular:  Negative for chest pain, palpitations, orthopnea and claudication.  Gastrointestinal:  Negative for abdominal pain, blood in stool, constipation, diarrhea, heartburn, melena, nausea and vomiting.  Genitourinary:  Negative for dysuria, flank pain, frequency, hematuria and urgency.  Musculoskeletal:  Negative for back pain, joint pain and myalgias.  Skin:  Negative for rash.  Neurological:  Negative for dizziness, tingling, focal weakness, seizures, weakness and headaches.  Endo/Heme/Allergies:  Does not bruise/bleed easily.  Psychiatric/Behavioral:  Negative for depression and suicidal ideas. The patient does not have insomnia.        No Known Allergies   Past Medical History:  Diagnosis Date   Anxiety    Basal cell carcinoma 03/07/2008   left dorsum mid forearm   Basal cell carcinoma 04/19/2013   left distal lat ant thigh   Basal cell carcinoma 04/11/2014   right forearm, right post shoulder near deltoid   Basal cell carcinoma 10/12/2018   left mid back 7.0 cm lat to spine   CAD (coronary artery disease)    Clotting disorder ([place])  Dysplastic nevus 02/02/2007   left volar mid to prox forearm   Dysplastic nevus 07/09/2010   right medial breast 1.5 cm med to areola, right med breast parasternal, right post distal deltoid    Eczema    GERD (gastroesophageal reflux disease)    Hyperlipemia    Hypertension    Lymphoma ([place]) 2015   Melanoma in situ ([place]) 12/22/2006   right medial breast, med sup quad   Personal history of chemotherapy      Past Surgical History:  Procedure Laterality Date   AUGMENTATION MAMMAPLASTY Bilateral 2003   CARDIAC CATHETERIZATION     X2 - has 5 stents. 4 LAD, 1 posterior, Last 3 placed 2004   COLONOSCOPY  06/29/2012   repeat 10 yrs - Dr. Candace Cruise   ESOPHAGOGASTRODUODENOSCOPY (EGD) WITH PROPOFOL N/A 11/22/2015   Procedure: ESOPHAGOGASTRODUODENOSCOPY (EGD) WITH PROPOFOL;  Surgeon: Lucilla Lame, MD;  Location: [facility];  Service: Endoscopy;  Laterality: N/A;   FACIAL COSMETIC SURGERY     gender re-affirmation     HERNIA REPAIR     x2   PORTA CATH REMOVAL N/A 04/10/2019   Procedure: PORTA CATH REMOVAL;  Surgeon: Algernon Huxley, MD;  Location: [facility];  Service: Cardiovascular;  Laterality: N/A;    Social History   Socioeconomic History   Marital status: Married    Spouse name: Not on file   Number of children: Not on file   Years of education: Not on file   Highest education level: Not on file  Occupational History   Not on file  Tobacco Use   Smoking status: Never   Smokeless tobacco: Never  Vaping Use   Vaping Use: Never used  Substance and Sexual Activity   Alcohol use: Yes    Alcohol/week: 1.0 standard drink of alcohol    Types: 1 Shots of liquor per week   Drug use: Never   Sexual activity: Not on file  Other Topics Concern   Not on file  Social History Narrative   Not on file   Social Determinants of Health   Financial Resource Strain: Low Risk  (04/10/2019)   Overall Financial Resource Strain (CARDIA)    Difficulty of Paying Living Expenses: Not hard at all  Food Insecurity: No Food Insecurity (02/03/2022)   Hunger Vital Sign    Worried About Running Out of Food in the Last Year: Never true    [place] in the Last Year: Never true   Transportation Needs: No Transportation Needs (02/03/2022)   PRAPARE - Hydrologist (Medical): No    Lack of Transportation (Non-Medical): No  Physical Activity: Insufficiently Active (02/03/2022)   Exercise Vital Sign    Days of Exercise per Week: 2 days    Minutes of Exercise per Session: 60 min  Stress: Stress Concern Present (02/03/2022)   [place]    Feeling of Stress : Very much  Social Connections: Moderately Integrated (02/03/2022)   Social Connection and Isolation Panel [NHANES]    Frequency of Communication with Friends and Family: More than three times a week    Frequency of Social Gatherings with Friends and Family: More than three times a week    Attends Religious Services: 1 to 4 times per year    Active Member of [employer] or Organizations: No    Attends Archivist Meetings: Never    Marital Status: Married  Human resources officer Violence: Not  At Risk (02/03/2022)   Humiliation, Afraid, Rape, and Kick questionnaire    Fear of Current or Ex-Partner: No    Emotionally Abused: No    Physically Abused: No    Sexually Abused: No    Family History  Problem Relation Age of Onset   Breast cancer Maternal Aunt 72     Current Outpatient Medications:    amLODipine (NORVASC) 5 MG tablet, Take 1 tablet (5 mg total) by mouth once daily, Disp: 90 tablet, Rfl: 4   aspirin 81 MG tablet, Take 81 mg by mouth daily., Disp: , Rfl:    clopidogrel (PLAVIX) 75 MG tablet, Take 1 tablet (75 mg total) by mouth daily. OFFICE VISIT NEEDED FOR ADDITIONAL REFILLS, Disp: 90 tablet, Rfl: 0   dupilumab (DUPIXENT) 300 MG/2ML prefilled syringe, INJECT 1 SYRINGE UNDER THE SKIN AS DIRECTED EVERY OTHER WEEK, Disp: 4 mL, Rfl: 6   estradiol (ESTRACE) 2 MG tablet, TAKE 1 TABLET BY MOUTH DAILY., Disp: 90 tablet, Rfl: 0   finasteride (PROSCAR) 5 MG tablet, TAKE 1 TABLET BY MOUTH DAILY., Disp: 90 tablet, Rfl: 0    isosorbide mononitrate (IMDUR) 30 MG 24 hr tablet, Take 1 tablet (30 mg total) by mouth once daily, Disp: 90 tablet, Rfl: 3   nystatin cream (MYCOSTATIN), Apply to the affected area(s) 2 (two) times daily., Disp: 30 g, Rfl: 1   pantoprazole (PROTONIX) 40 MG tablet, TAKE 1 TABLET (40 MG TOTAL) BY MOUTH DAILY., Disp: 90 tablet, Rfl: 0   rosuvastatin (CRESTOR) 40 MG tablet, Take 1 tablet (40 mg total) by mouth once daily, Disp: 90 tablet, Rfl: 4   Ruxolitinib Phosphate (OPZELURA) 1.5 % CREA, Apply 1 application topically 2 (two) times daily., Disp: 60 g, Rfl: 2   triamcinolone cream (KENALOG) 0.1 %, Apply 1 application topically 2 (two) times daily. To rash on legs, Disp: 453.6 g, Rfl: 3   valsartan (DIOVAN) 160 MG tablet, Take 1 tablet (160 mg total) by mouth once daily, Disp: 90 tablet, Rfl: 3   guaiFENesin-codeine (ROBITUSSIN AC) 100-10 MG/5ML syrup, Take 5 mLs by mouth 3 (three) times daily as needed for cough. (Patient not taking: Reported on 03/18/2022), Disp: 118 mL, Rfl: 0   hyoscyamine (ANASPAZ) 0.125 MG TBDP disintergrating tablet, Place 1 tablet (0.125 mg total) under the tongue every 6 (six) hours as needed. (Patient not taking: Reported on 03/18/2022), Disp: 30 tablet, Rfl: 0   nitroGLYCERIN (NITROSTAT) 0.4 MG SL tablet, Place 1 tablet (0.4 mg total) under the tongue every 5 (five) minutes as needed for chest pain. May take up to 3 doses. (Patient not taking: Reported on 03/18/2022), Disp: 25 tablet, Rfl: 11   nitroGLYCERIN (NITROSTAT) 0.4 MG SL tablet, Place under the tongue. (Patient not taking: Reported on 03/18/2022), Disp: , Rfl:    ondansetron (ZOFRAN) 4 MG tablet, Take 1 tablet (4 mg total) by mouth every 8 (eight) hours as needed for nausea or vomiting. (Patient not taking: Reported on 03/18/2022), Disp: 20 tablet, Rfl: 0 No current facility-administered medications for this visit.  Facility-Administered Medications Ordered in Other Visits:    heparin lock flush 100 unit/mL, 500 Units,  Intravenous, Once, Choksi, Janak, MD   sodium chloride 0.9 % injection 10 mL, 10 mL, Intravenous, PRN, Forest Gleason, MD  Physical exam:  Vitals:   03/18/22 1412  BP: 125/89  Pulse: 82  Resp: 16  Temp: 97.8 F (36.6 C)  TempSrc: Tympanic  Weight: 193 lb 9.6 oz (87.8 kg)  Height: 6' (1.829 m)  Physical Exam Constitutional:      General: She is not in acute distress. Cardiovascular:     Rate and Rhythm: Normal rate and regular rhythm.     Heart sounds: Normal heart sounds.  Pulmonary:     Effort: Pulmonary effort is normal.     Breath sounds: Normal breath sounds.  Abdominal:     General: Bowel sounds are normal.     Palpations: Abdomen is soft.  Lymphadenopathy:     Comments: No palpable cervical, supraclavicular, axillary or inguinal adenopathy    Skin:    General: Skin is warm and dry.  Neurological:     Mental Status: She is alert and oriented to person, place, and time.         Latest Ref Rng & Units 03/18/2022    1:38 PM  CMP  Glucose 70 - 99 mg/dL 93   BUN 8 - 23 mg/dL 13   Creatinine 0.44 - 1.00 mg/dL 0.77   Sodium 135 - 145 mmol/L 136   Potassium 3.5 - 5.1 mmol/L 3.6   Chloride 98 - 111 mmol/L 104   CO2 22 - 32 mmol/L 24   Calcium 8.9 - 10.3 mg/dL 8.2   Total Protein 6.5 - 8.1 g/dL 7.3   Total Bilirubin 0.3 - 1.2 mg/dL 0.6   Alkaline Phos 38 - 126 U/L 41   AST 15 - 41 U/L 18   ALT 0 - 44 U/L 16       Latest Ref Rng & Units 03/18/2022    1:38 PM  CBC  WBC 4.0 - 10.5 K/uL 6.7   Hemoglobin 12.0 - 15.0 g/dL 12.9   Hematocrit 36.0 - 46.0 % 39.1   Platelets 150 - 400 K/uL 233     Assessment and plan- Patient is a 71 y.o. adult with history of diffuse large B-cell lymphoma in 2015 s/p6 cycles of R-CHOP chemotherapy currently in CR 1 here for routine follow-up  Labs are unremarkable.  Clinically patient is doing well with no concerning signs and symptoms of recurrence based on today's exam.  It is now close to 8 years since her diagnosis and given  that she is doing well otherwise she does not require oncology follow-up at this time.  She can be referred to [place] if any questions or concerns arise   Visit Diagnosis 1. Encounter for follow-up surveillance of diffuse large B-cell lymphoma      Dr. Randa Evens, MD, MPH [facility] at [facility] [phone] 03/18/2022 3:04 PM

## 2022-03-25 ENCOUNTER — Ambulatory Visit: Payer: 59 | Admitting: Dermatology

## 2022-03-25 DIAGNOSIS — D229 Melanocytic nevi, unspecified: Secondary | ICD-10-CM | POA: Diagnosis not present

## 2022-03-25 DIAGNOSIS — L814 Other melanin hyperpigmentation: Secondary | ICD-10-CM

## 2022-03-25 DIAGNOSIS — D1800 Hemangioma unspecified site: Secondary | ICD-10-CM

## 2022-03-25 DIAGNOSIS — Z86018 Personal history of other benign neoplasm: Secondary | ICD-10-CM

## 2022-03-25 DIAGNOSIS — Z79899 Other long term (current) drug therapy: Secondary | ICD-10-CM | POA: Diagnosis not present

## 2022-03-25 DIAGNOSIS — Z85828 Personal history of other malignant neoplasm of skin: Secondary | ICD-10-CM

## 2022-03-25 DIAGNOSIS — L2081 Atopic neurodermatitis: Secondary | ICD-10-CM

## 2022-03-25 DIAGNOSIS — C8580 Other specified types of non-Hodgkin lymphoma, unspecified site: Secondary | ICD-10-CM | POA: Diagnosis not present

## 2022-03-25 DIAGNOSIS — L821 Other seborrheic keratosis: Secondary | ICD-10-CM

## 2022-03-25 DIAGNOSIS — Z86006 Personal history of melanoma in-situ: Secondary | ICD-10-CM

## 2022-03-25 DIAGNOSIS — L578 Other skin changes due to chronic exposure to nonionizing radiation: Secondary | ICD-10-CM

## 2022-03-25 DIAGNOSIS — Z1283 Encounter for screening for malignant neoplasm of skin: Secondary | ICD-10-CM

## 2022-03-25 NOTE — Patient Instructions (Signed)
Due to recent changes in healthcare laws, you may see results of your pathology and/or laboratory studies on MyChart before the doctors have had a chance to review them. We understand that in some cases there may be results that are confusing or concerning to you. Please understand that not all results are received at the same time and often the doctors may need to interpret multiple results in order to provide you with the best plan of care or course of treatment. Therefore, we ask that you please give us 2 business days to thoroughly review all your results before contacting the office for clarification. Should we see a critical lab result, you will be contacted sooner.   If You Need Anything After Your Visit  If you have any questions or concerns for your doctor, please call our main line at [phone] and press option 4 to reach your doctor's medical assistant. If no one answers, please leave a voicemail as directed and we will return your call as soon as possible. Messages left after 4 pm will be answered the following business day.   You may also send us a message via MyChart. We typically respond to MyChart messages within 1-2 business days.  For prescription refills, please ask your pharmacy to contact our office. Our fax number is [phone].  If you have an urgent issue when the clinic is closed that cannot wait until the next business day, you can page your doctor at the number below.    Please note that while we do our best to be available for urgent issues outside of office hours, we are not available 24/7.   If you have an urgent issue and are unable to reach us, you may choose to seek medical care at your doctor's office, retail clinic, urgent care center, or emergency room.  If you have a medical emergency, please immediately call 911 or go to the emergency department.  Pager Numbers  - Dr. Kowalski: [phone]  - Dr. Moye: [phone]  - Dr. Stewart:  [phone]  In the event of inclement weather, please call our main line at [phone] for an update on the status of any delays or closures.  Dermatology Medication Tips: Please keep the boxes that topical medications come in in order to help keep track of the instructions about where and how to use these. Pharmacies typically print the medication instructions only on the boxes and not directly on the medication tubes.   If your medication is too expensive, please contact our office at [phone] option 4 or send us a message through MyChart.   We are unable to tell what your co-pay for medications will be in advance as this is different depending on your insurance coverage. However, we may be able to find a substitute medication at lower cost or fill out paperwork to get insurance to cover a needed medication.   If a prior authorization is required to get your medication covered by your insurance company, please allow us 1-2 business days to complete this process.  Drug prices often vary depending on where the prescription is filled and some pharmacies may offer cheaper prices.  The website www.goodrx.com contains coupons for medications through different pharmacies. The prices here do not account for what the cost may be with help from insurance (it may be cheaper with your insurance), but the website can give you the price if you did not use any insurance.  - You can print the associated coupon and take it with   your prescription to the pharmacy.  - You may also stop by our office during regular business hours and pick up a GoodRx coupon card.  - If you need your prescription sent electronically to a different pharmacy, notify our office through [place] MyChart or by phone at [phone] option 4.     Si Usted Necesita Algo Despus de Su Visita  Tambin puede enviarnos un mensaje a travs de MyChart. Por lo general respondemos a los mensajes de MyChart en el transcurso de 1 a 2  das hbiles.  Para renovar recetas, por favor pida a su farmacia que se ponga en contacto con nuestra oficina. Nuestro nmero de fax es el [phone].  Si tiene un asunto urgente cuando la clnica est cerrada y que no puede esperar hasta el siguiente da hbil, puede llamar/localizar a su doctor(a) al nmero que aparece a continuacin.   Por favor, tenga en cuenta que aunque hacemos todo lo posible para estar disponibles para asuntos urgentes fuera del horario de oficina, no estamos disponibles las 24 horas del da, los 7 das de la semana.   Si tiene un problema urgente y no puede comunicarse con nosotros, puede optar por buscar atencin mdica  en el consultorio de su doctor(a), en una clnica privada, en un centro de atencin urgente o en una sala de emergencias.  Si tiene una emergencia mdica, por favor llame inmediatamente al 911 o vaya a la sala de emergencias.  Nmeros de bper  - Dr. Kowalski: [phone]  - Dra. Moye: [phone]  - Dra. Stewart: [phone]  En caso de inclemencias del tiempo, por favor llame a nuestra lnea principal al [phone] para una actualizacin sobre el estado de cualquier retraso o cierre.  Consejos para la medicacin en dermatologa: Por favor, guarde las cajas en las que vienen los medicamentos de uso tpico para ayudarle a seguir las instrucciones sobre dnde y cmo usarlos. Las farmacias generalmente imprimen las instrucciones del medicamento slo en las cajas y no directamente en los tubos del medicamento.   Si su medicamento es muy caro, por favor, pngase en contacto con nuestra oficina llamando al [phone] y presione la opcin 4 o envenos un mensaje a travs de MyChart.   No podemos decirle cul ser su copago por los medicamentos por adelantado ya que esto es diferente dependiendo de la cobertura de su seguro. Sin embargo, es posible que podamos encontrar un medicamento sustituto a menor costo o llenar un formulario para que el  seguro cubra el medicamento que se considera necesario.   Si se requiere una autorizacin previa para que su compaa de seguros cubra su medicamento, por favor permtanos de 1 a 2 das hbiles para completar este proceso.  Los precios de los medicamentos varan con frecuencia dependiendo del lugar de dnde se surte la receta y alguna farmacias pueden ofrecer precios ms baratos.  El sitio web www.goodrx.com tiene cupones para medicamentos de diferentes farmacias. Los precios aqu no tienen en cuenta lo que podra costar con la ayuda del seguro (puede ser ms barato con su seguro), pero el sitio web puede darle el precio si no utiliz ningn seguro.  - Puede imprimir el cupn correspondiente y llevarlo con su receta a la farmacia.  - Tambin puede pasar por nuestra oficina durante el horario de atencin regular y recoger una tarjeta de cupones de GoodRx.  - Si necesita que su receta se enve electrnicamente a una farmacia diferente, informe a nuestra oficina a travs de MyChart de [place]   o por telfono llamando al [phone] y presione la opcin 4.  

## 2022-03-25 NOTE — Progress Notes (Unsigned)
Follow-Up Visit   Subjective  Kendra Salas is a 71 y.o. adult who presents for the following: Annual Exam. The patient presents for Total-Body Skin Exam (TBSE) for skin cancer screening and mole check.  The patient has spots, moles and lesions to be evaluated, some may be new or changing and the patient has concerns that these could be cancer. Hx of Melanoma in situ. Hx of BCC, Hx of Dysplastic nevus. Dermatitis on the body patient taking Dupixent injection every 2 weeks with a good response   The following portions of the chart were reviewed this encounter and updated as appropriate:       Review of Systems:  No other skin or systemic complaints except as noted in HPI or Assessment and Plan.  Objective  Well appearing patient in no apparent distress; mood and affect are within normal limits.  A full examination was performed including scalp, head, eyes, ears, nose, lips, neck, chest, axillae, abdomen, back, buttocks, bilateral upper extremities, bilateral lower extremities, hands, feet, fingers, toes, fingernails, and toenails. All findings within normal limits unless otherwise noted below.  trunk, exts Crust on the right deltoid and right wrist     Assessment & Plan  Atopic neurodermatitis trunk, exts  Atopic dermatitis - Severe, on Dupixent (biologic medication).  Atopic dermatitis (eczema) is a chronic, relapsing, pruritic condition that can significantly affect quality of life. It is often associated with allergic rhinitis and/or asthma and can require treatment with topical medications, phototherapy, or in severe cases a biologic medication called Dupixent, which requires long term medication management.     Lentigines - Scattered tan macules - Due to sun exposure - Benign-appearing, observe - Recommend daily broad spectrum sunscreen SPF 30+ to sun-exposed areas, reapply every 2 hours as needed. - Call for any changes  Seborrheic Keratoses - Stuck-on, waxy,  tan-brown papules and/or plaques  - Benign-appearing - Discussed benign etiology and prognosis. - Observe - Call for any changes  Melanocytic Nevi - Tan-brown and/or pink-flesh-colored symmetric macules and papules - Benign appearing on exam today - Observation - Call clinic for new or changing moles - Recommend daily use of broad spectrum spf 30+ sunscreen to sun-exposed areas.   Hemangiomas - Red papules - Discussed benign nature - Observe - Call for any changes  Actinic Damage - Chronic condition, secondary to cumulative UV/sun exposure - diffuse scaly erythematous macules with underlying dyspigmentation - Recommend daily broad spectrum sunscreen SPF 30+ to sun-exposed areas, reapply every 2 hours as needed.  - Staying in the shade or wearing long sleeves, sun glasses (UVA+UVB protection) and wide brim hats (4-inch brim around the entire circumference of the hat) are also recommended for sun protection.  - Call for new or changing lesions.  History of Dysplastic Nevi Multiple see history  - No evidence of recurrence today - Recommend regular full body skin exams - Recommend daily broad spectrum sunscreen SPF 30+ to sun-exposed areas, reapply every 2 hours as needed.  - Call if any new or changing lesions are noted between office visits   History of Basal Cell Carcinoma of the Skin Multiple see history  - No evidence of recurrence today - Recommend regular full body skin exams - Recommend daily broad spectrum sunscreen SPF 30+ to sun-exposed areas, reapply every 2 hours as needed.  - Call if any new or changing lesions are noted between office visits   History of Melanoma in Situ Right medial breast 2008 - No evidence of recurrence today -  Recommend regular full body skin exams - Recommend daily broad spectrum sunscreen SPF 30+ to sun-exposed areas, reapply every 2 hours as needed.  - Call if any new or changing lesions are noted between office visits   Non  Non-Hodgkin lymphoma in remission  Skin cancer screening performed today.    Return in about 6 months (around 09/24/2022) for dupixent f/u .  IMarye Round, CMA, am acting as scribe for Sarina Ser, MD .

## 2022-03-27 ENCOUNTER — Encounter: Payer: Self-pay | Admitting: Dermatology

## 2022-03-31 ENCOUNTER — Other Ambulatory Visit: Payer: Self-pay

## 2022-03-31 ENCOUNTER — Other Ambulatory Visit (HOSPITAL_COMMUNITY): Payer: Self-pay

## 2022-04-01 ENCOUNTER — Other Ambulatory Visit: Payer: 59

## 2022-04-01 ENCOUNTER — Ambulatory Visit: Payer: 59 | Admitting: Oncology

## 2022-04-10 ENCOUNTER — Other Ambulatory Visit: Payer: Self-pay

## 2022-04-20 ENCOUNTER — Other Ambulatory Visit: Payer: Self-pay

## 2022-04-20 DIAGNOSIS — N3001 Acute cystitis with hematuria: Secondary | ICD-10-CM

## 2022-04-20 MED ORDER — CIPROFLOXACIN HCL 250 MG PO TABS: 250.0000 mg | ORAL_TABLET | Freq: Two times a day (BID) | ORAL | 0 refills | Status: AC

## 2022-04-23 ENCOUNTER — Other Ambulatory Visit: Payer: Self-pay

## 2022-04-24 ENCOUNTER — Other Ambulatory Visit: Payer: Self-pay | Admitting: Internal Medicine

## 2022-04-24 DIAGNOSIS — L03116 Cellulitis of left lower limb: Secondary | ICD-10-CM

## 2022-04-24 MED ORDER — CEPHALEXIN 500 MG PO CAPS: 500.0000 mg | ORAL_CAPSULE | Freq: Four times a day (QID) | ORAL | 0 refills | Status: AC

## 2022-04-24 MED ORDER — MUPIROCIN 2 % EX OINT: 1.0000 | TOPICAL_OINTMENT | Freq: Two times a day (BID) | CUTANEOUS | 1 refills | Status: DC

## 2022-04-27 ENCOUNTER — Other Ambulatory Visit (HOSPITAL_COMMUNITY): Payer: Self-pay

## 2022-04-28 ENCOUNTER — Other Ambulatory Visit (HOSPITAL_COMMUNITY): Payer: Self-pay

## 2022-04-29 ENCOUNTER — Other Ambulatory Visit: Payer: Self-pay

## 2022-04-29 MED ORDER — MECLIZINE HCL 25 MG PO TABS: 25.0000 mg | ORAL_TABLET | Freq: Three times a day (TID) | ORAL | 0 refills | Status: DC | PRN

## 2022-05-15 ENCOUNTER — Other Ambulatory Visit: Payer: Self-pay

## 2022-05-15 MED ORDER — DOXYCYCLINE HYCLATE 100 MG PO TABS: 100.0000 mg | ORAL_TABLET | Freq: Two times a day (BID) | ORAL | 0 refills | Status: DC

## 2022-05-18 ENCOUNTER — Other Ambulatory Visit: Payer: Self-pay

## 2022-05-18 ENCOUNTER — Other Ambulatory Visit: Payer: Self-pay | Admitting: Internal Medicine

## 2022-05-18 ENCOUNTER — Other Ambulatory Visit
Admission: RE | Admit: 2022-05-18 | Discharge: 2022-05-18 | Disposition: A | Discharge status: Home / Self Care | Payer: 59 | Attending: Internal Medicine | Admitting: Internal Medicine

## 2022-05-18 DIAGNOSIS — L03116 Cellulitis of left lower limb: Secondary | ICD-10-CM | POA: Diagnosis not present

## 2022-05-18 DIAGNOSIS — I25118 Atherosclerotic heart disease of native coronary artery with other forms of angina pectoris: Secondary | ICD-10-CM

## 2022-05-18 LAB — CBC WITH DIFFERENTIAL/PLATELET
Abs Immature Granulocytes: 0.02 10*3/uL (ref 0.00–0.07)
Basophils Absolute: 0 10*3/uL (ref 0.0–0.1)
Basophils Relative: 1 %
Eosinophils Absolute: 0.1 10*3/uL (ref 0.0–0.5)
Eosinophils Relative: 1 %
HCT: 38.8 % (ref 36.0–46.0)
Hemoglobin: 12.8 g/dL (ref 12.0–15.0)
Immature Granulocytes: 0 %
Lymphocytes Relative: 18 %
Lymphs Abs: 1.4 10*3/uL (ref 0.7–4.0)
MCH: 27.1 pg (ref 26.0–34.0)
MCHC: 33 g/dL (ref 30.0–36.0)
MCV: 82 fL (ref 80.0–100.0)
Monocytes Absolute: 0.9 10*3/uL (ref 0.1–1.0)
Monocytes Relative: 12 %
Neutro Abs: 5.3 10*3/uL (ref 1.7–7.7)
Neutrophils Relative %: 68 %
Platelets: 269 10*3/uL (ref 150–400)
RBC: 4.73 MIL/uL (ref 3.87–5.11)
RDW: 14.2 % (ref 11.5–15.5)
WBC: 7.7 10*3/uL (ref 4.0–10.5)
nRBC: 0 % (ref 0.0–0.2)

## 2022-05-27 ENCOUNTER — Other Ambulatory Visit (HOSPITAL_COMMUNITY): Payer: Self-pay

## 2022-05-27 ENCOUNTER — Other Ambulatory Visit: Payer: Self-pay | Admitting: Dermatology

## 2022-05-27 ENCOUNTER — Other Ambulatory Visit: Payer: Self-pay | Admitting: Pharmacist

## 2022-05-27 MED ORDER — DUPIXENT 300 MG/2ML ~~LOC~~ SOSY
PREFILLED_SYRINGE | SUBCUTANEOUS | 6 refills | Status: DC
  Filled 2022-05-27: qty 4, fill #0

## 2022-05-27 MED ORDER — DUPIXENT 300 MG/2ML ~~LOC~~ SOSY
PREFILLED_SYRINGE | SUBCUTANEOUS | 6 refills | Status: DC
  Filled 2022-05-27: qty 4, 28d supply, fill #0
  Filled 2022-06-19: qty 4, 28d supply, fill #1
  Filled 2022-07-31: qty 4, 28d supply, fill #2
  Filled 2022-08-25: qty 4, 28d supply, fill #3
  Filled 2022-09-22: qty 4, 28d supply, fill #4
  Filled 2022-10-30 – 2022-11-09 (×2): qty 4, 28d supply, fill #5
  Filled 2022-12-01: qty 4, 28d supply, fill #6

## 2022-05-29 ENCOUNTER — Other Ambulatory Visit (HOSPITAL_COMMUNITY): Payer: Self-pay

## 2022-06-03 ENCOUNTER — Encounter: Payer: 59 | Admitting: Internal Medicine

## 2022-06-11 ENCOUNTER — Other Ambulatory Visit: Payer: Self-pay

## 2022-06-11 ENCOUNTER — Other Ambulatory Visit: Payer: Self-pay | Admitting: Internal Medicine

## 2022-06-11 DIAGNOSIS — Z79890 Hormone replacement therapy (postmenopausal): Secondary | ICD-10-CM

## 2022-06-11 DIAGNOSIS — I25118 Atherosclerotic heart disease of native coronary artery with other forms of angina pectoris: Secondary | ICD-10-CM

## 2022-06-11 DIAGNOSIS — K449 Diaphragmatic hernia without obstruction or gangrene: Secondary | ICD-10-CM

## 2022-06-12 ENCOUNTER — Other Ambulatory Visit: Payer: Self-pay

## 2022-06-12 MED FILL — Clopidogrel Bisulfate Tab 75 MG (Base Equiv): ORAL | 90 days supply | Qty: 90 | Fill #0 | Status: AC

## 2022-06-12 MED FILL — Pantoprazole Sodium EC Tab 40 MG (Base Equiv): ORAL | 90 days supply | Qty: 90 | Fill #0 | Status: AC

## 2022-06-12 MED FILL — Finasteride Tab 5 MG: ORAL | 90 days supply | Qty: 90 | Fill #0 | Status: AC

## 2022-06-12 MED FILL — Estradiol Tab 2 MG: ORAL | 90 days supply | Qty: 90 | Fill #0 | Status: AC

## 2022-06-12 NOTE — Telephone Encounter (Signed)
Requested medication (s) are due for refill today: yes  Requested medication (s) are on the active medication list: yes  Last refill:  03/12/22 #90 with 0 RF  Future visit scheduled: no, seen 10/21/21, canceled 06/03/22  Notes to clinic:  Already had one curtesy refill on this med, no upcoming appt, please assess.      Requested Prescriptions  Pending Prescriptions Disp Refills   clopidogrel (PLAVIX) 75 MG tablet 90 tablet 0    Sig: Take 1 tablet (75 mg total) by mouth daily. OFFICE VISIT NEEDED FOR ADDITIONAL REFILLS     Hematology: Antiplatelets - clopidogrel Failed - 06/11/2022  8:57 AM      Failed - Valid encounter within last 6 months    Recent Outpatient Visits           7 months ago Acute cough   [place] [facility] at [facility], Jesse Sans, MD   10 months ago Encounter for medication review   [place], [place]   1 year ago Annual physical exam   [place] [facility] at [facility], Jesse Sans, MD   2 years ago Annual physical exam   [place] [facility] at [facility], Jesse Sans, MD   2 years ago Encounter for medication review   [place], RPH-CPP       Future Appointments             In 3 months Ralene Bathe, MD [place] in normal range and within 180 days    HCT  Date Value Ref Range Status  05/18/2022 38.8 36.0 - 46.0 % Final  11/06/2014 33.4 (L) 35.0 - 47.0 % Final         Passed - HGB in normal range and within 180 days    Hemoglobin  Date Value Ref Range Status  05/18/2022 12.8 12.0 - 15.0 g/dL Final   HGB  Date Value Ref Range Status  11/06/2014 11.4 (L) 12.0 - 16.0 g/dL Final         Passed - PLT in normal range and within 180 days    Platelets  Date Value Ref Range Status  05/18/2022  269 150 - 400 K/uL Final   Platelet  Date Value Ref Range Status  11/06/2014 194 150 - 440 x10 3/mm  Final         Passed - Cr in normal range and within 360 days    Creatinine  Date Value Ref Range Status  10/16/2014 0.85 0.60 - 1.30 mg/dL Final   Creatinine, Ser  Date Value Ref Range Status  03/18/2022 0.77 0.44 - 1.00 mg/dL Final         Signed Prescriptions Disp Refills   finasteride (PROSCAR) 5 MG tablet 90 tablet 0    Sig: TAKE 1 TABLET BY MOUTH DAILY.     Urology: 5-alpha Reductase Inhibitors Passed - 06/11/2022  8:57 AM      Passed - PSA in normal range and within 360 days    PSA  Date Value Ref Range Status  01/05/2022 0.1  Final   Prostate Specific Ag, Serum  Date Value Ref Range Status  05/04/2019 <0.1 Not Estab. ng/mL Final    Comment:    Roche ECLIA methodology.  According to the American Urological Association, Serum PSA should decrease and remain at undetectable levels after radical prostatectomy. The AUA defines biochemical recurrence as an initial PSA value 0.2 ng/mL or greater followed by a subsequent confirmatory PSA value 0.2 ng/mL or greater. Values obtained with different assay methods or kits cannot be used interchangeably. Results cannot be interpreted as absolute evidence of the presence or absence of malignant disease.          Passed - Valid encounter within last 12 months    Recent Outpatient Visits           7 months ago Acute cough   [place] [facility] at [facility], Jesse Sans, MD   10 months ago Encounter for medication review   [place], RPH-CPP   1 year ago Annual physical exam   [place] [facility] at [facility], Jesse Sans, MD   2 years ago Annual physical exam   [facility] at [facility], Jesse Sans, MD   2 years ago Encounter for medication review    [place], RPH-CPP       Future Appointments             In 3 months Ralene Bathe, MD [place]             estradiol (ESTRACE) 2 MG tablet 90 tablet 0    Sig: TAKE 1 TABLET BY MOUTH DAILY.     OB/GYN:  Estrogens Passed - 06/11/2022  8:57 AM      Passed - Mammogram is up-to-date per Health Maintenance      Passed - Last BP in normal range    BP Readings from Last 1 Encounters:  03/18/22 125/89         Passed - Valid encounter within last 12 months    Recent Outpatient Visits           7 months ago Acute cough   [place] [facility] at [facility], Jesse Sans, MD   10 months ago Encounter for medication review   [place], RPH-CPP   1 year ago Annual physical exam   [place] [facility] at [facility], Jesse Sans, MD   2 years ago Annual physical exam   [facility] at [facility], Jesse Sans, MD   2 years ago Encounter for medication review   [place], RPH-CPP       Future Appointments             In 3 months Ralene Bathe, MD [place]             pantoprazole (PROTONIX) 40 MG tablet 90 tablet 0    Sig: TAKE 1 TABLET (40 MG TOTAL) BY MOUTH DAILY.     Gastroenterology: Proton Pump Inhibitors Passed - 06/11/2022  8:57 AM      Passed - Valid encounter within last 12 months    Recent Outpatient Visits           7 months ago Acute cough   [place] [facility] at [facility], Jesse Sans, MD  10 months ago Encounter for medication review   [place], RPH-CPP   1 year ago Annual physical exam   [place] [facility] at [facility],  Jesse Sans, MD   2 years ago Annual physical exam   [facility] at [facility], Jesse Sans, MD   2 years ago Encounter for medication review   [place], RPH-CPP       Future Appointments             In 3 months Ralene Bathe, MD [place]

## 2022-06-12 NOTE — Telephone Encounter (Signed)
Requested Prescriptions  Pending Prescriptions Disp Refills  . finasteride (PROSCAR) 5 MG tablet 90 tablet 0    Sig: TAKE 1 TABLET BY MOUTH DAILY.     Urology: 5-alpha Reductase Inhibitors Passed - 06/11/2022  8:57 AM      Passed - PSA in normal range and within 360 days    PSA  Date Value Ref Range Status  01/05/2022 0.1  Final   Prostate Specific Ag, Serum  Date Value Ref Range Status  05/04/2019 <0.1 Not Estab. ng/mL Final    Comment:    Roche ECLIA methodology. According to the American Urological Association, Serum PSA should decrease and remain at undetectable levels after radical prostatectomy. The AUA defines biochemical recurrence as an initial PSA value 0.2 ng/mL or greater followed by a subsequent confirmatory PSA value 0.2 ng/mL or greater. Values obtained with different assay methods or kits cannot be used interchangeably. Results cannot be interpreted as absolute evidence of the presence or absence of malignant disease.          Passed - Valid encounter within last 12 months    Recent Outpatient Visits          7 months ago Acute cough   [place] [facility] at [facility], Jesse Sans, MD   10 months ago Encounter for medication review   [place], RPH-CPP   1 year ago Annual physical exam   [place] [facility] at [facility], Jesse Sans, MD   2 years ago Annual physical exam   [facility] at [facility], Jesse Sans, MD   2 years ago Encounter for medication review   [place], RPH-CPP      Future Appointments            In 3 months Ralene Bathe, MD [place]           . estradiol (ESTRACE) 2 MG tablet 90 tablet 0    Sig: TAKE 1 TABLET BY MOUTH DAILY.     OB/GYN:  Estrogens Passed - 06/11/2022  8:57 AM      Passed  - Mammogram is up-to-date per Health Maintenance      Passed - Last BP in normal range    BP Readings from Last 1 Encounters:  03/18/22 125/89         Passed - Valid encounter within last 12 months    Recent Outpatient Visits          7 months ago Acute cough   [place] [facility], Jesse Sans, MD   10 months ago Encounter for medication review   [place], RPH-CPP   1 year ago Annual physical exam   [place] [facility] at [facility], Jesse Sans, MD   2 years ago Annual physical exam   [facility], Jesse Sans, MD   2 years ago Encounter for medication review   [place], RPH-CPP      Future Appointments            In 3 months Ralene Bathe, MD [place]           .  pantoprazole (PROTONIX) 40 MG tablet 90 tablet 0    Sig: TAKE 1 TABLET (40 MG TOTAL) BY MOUTH DAILY.     Gastroenterology: Proton Pump Inhibitors Passed - 06/11/2022  8:57 AM      Passed - Valid encounter within last 12 months    Recent Outpatient Visits          7 months ago Acute cough   [place] [facility] at [facility], Jesse Sans, MD   10 months ago Encounter for medication review   [place], RPH-CPP   1 year ago Annual physical exam   [place] [facility] at [facility], Jesse Sans, MD   2 years ago Annual physical exam   [facility] at [facility], Jesse Sans, MD   2 years ago Encounter for medication review   [place], RPH-CPP      Future Appointments            In 3 months Ralene Bathe, MD [place]           . clopidogrel (PLAVIX) 75 MG tablet 90 tablet 0    Sig: Take 1 tablet (75 mg total) by mouth daily. OFFICE VISIT NEEDED FOR ADDITIONAL REFILLS     Hematology: Antiplatelets - clopidogrel Failed - 06/11/2022  8:57 AM      Failed - Valid encounter within last 6 months    Recent Outpatient Visits          7 months ago Acute cough   [place] [facility] at [facility], Jesse Sans, MD   10 months ago Encounter for medication review   [place], RPH-CPP   1 year ago Annual physical exam   [place] [facility] at [facility], Jesse Sans, MD   2 years ago Annual physical exam   [place] [facility] at [facility], Jesse Sans, MD   2 years ago Encounter for medication review   [place], RPH-CPP      Future Appointments            In 3 months Ralene Bathe, MD [place] in normal range and within 180 days    HCT  Date Value Ref Range Status  05/18/2022 38.8 36.0 - 46.0 % Final  11/06/2014 33.4 (L) 35.0 - 47.0 % Final         Passed - HGB in normal range and within 180 days    Hemoglobin  Date Value Ref Range Status  05/18/2022 12.8 12.0 - 15.0 g/dL Final   HGB  Date Value Ref Range Status  11/06/2014 11.4 (L) 12.0 - 16.0 g/dL Final         Passed - PLT in normal range and within 180 days    Platelets  Date Value Ref Range Status  05/18/2022 269 150 - 400 K/uL Final   Platelet  Date Value Ref Range Status  11/06/2014 194 150 - 440 x10 3/mm  Final         Passed - Cr in normal range and within 360 days  Creatinine  Date Value Ref Range Status  10/16/2014 0.85 0.60 - 1.30 mg/dL Final   Creatinine, Ser  Date Value Ref Range Status  03/18/2022 0.77 0.44 - 1.00 mg/dL Final

## 2022-06-19 ENCOUNTER — Other Ambulatory Visit (HOSPITAL_COMMUNITY): Payer: Self-pay

## 2022-06-19 ENCOUNTER — Other Ambulatory Visit: Payer: Self-pay

## 2022-07-02 ENCOUNTER — Other Ambulatory Visit: Payer: Self-pay

## 2022-07-02 MED ORDER — CEPHALEXIN 500 MG PO CAPS: 500.0000 mg | ORAL_CAPSULE | Freq: Three times a day (TID) | ORAL | 0 refills | Status: DC

## 2022-07-06 ENCOUNTER — Other Ambulatory Visit: Payer: Self-pay | Admitting: Internal Medicine

## 2022-07-06 DIAGNOSIS — N3001 Acute cystitis with hematuria: Secondary | ICD-10-CM

## 2022-07-09 LAB — URINE CULTURE

## 2022-07-10 ENCOUNTER — Other Ambulatory Visit (HOSPITAL_COMMUNITY): Payer: Self-pay

## 2022-07-13 ENCOUNTER — Other Ambulatory Visit: Payer: Self-pay | Admitting: Internal Medicine

## 2022-07-13 ENCOUNTER — Other Ambulatory Visit: Payer: Self-pay

## 2022-07-13 DIAGNOSIS — Z1211 Encounter for screening for malignant neoplasm of colon: Secondary | ICD-10-CM

## 2022-07-13 MED ORDER — NA SULFATE-K SULFATE-MG SULF 17.5-3.13-1.6 GM/177ML PO SOLN: 1.0000 | Freq: Once | ORAL | 0 refills | Status: AC

## 2022-07-31 ENCOUNTER — Other Ambulatory Visit (HOSPITAL_COMMUNITY): Payer: Self-pay

## 2022-08-03 ENCOUNTER — Encounter: Payer: Self-pay | Admitting: Gastroenterology

## 2022-08-07 ENCOUNTER — Other Ambulatory Visit: Payer: Self-pay

## 2022-08-07 ENCOUNTER — Ambulatory Visit: Payer: 59 | Admitting: Anesthesiology

## 2022-08-07 ENCOUNTER — Ambulatory Visit
Admission: RE | Admit: 2022-08-07 | Discharge: 2022-08-07 | Disposition: A | Discharge status: Home / Self Care | Payer: 59 | Source: Ambulatory Visit | Attending: Gastroenterology | Admitting: Gastroenterology

## 2022-08-07 ENCOUNTER — Encounter
Admission: RE | Disposition: A | Discharge status: Home / Self Care | Payer: Self-pay | Source: Ambulatory Visit | Attending: Gastroenterology

## 2022-08-07 DIAGNOSIS — K5730 Diverticulosis of large intestine without perforation or abscess without bleeding: Secondary | ICD-10-CM | POA: Diagnosis not present

## 2022-08-07 DIAGNOSIS — D124 Benign neoplasm of descending colon: Secondary | ICD-10-CM | POA: Diagnosis not present

## 2022-08-07 DIAGNOSIS — K644 Residual hemorrhoidal skin tags: Secondary | ICD-10-CM | POA: Diagnosis not present

## 2022-08-07 DIAGNOSIS — I10 Essential (primary) hypertension: Secondary | ICD-10-CM | POA: Insufficient documentation

## 2022-08-07 DIAGNOSIS — Z1211 Encounter for screening for malignant neoplasm of colon: Secondary | ICD-10-CM

## 2022-08-07 DIAGNOSIS — D123 Benign neoplasm of transverse colon: Secondary | ICD-10-CM | POA: Insufficient documentation

## 2022-08-07 DIAGNOSIS — D126 Benign neoplasm of colon, unspecified: Secondary | ICD-10-CM | POA: Diagnosis not present

## 2022-08-07 DIAGNOSIS — K642 Third degree hemorrhoids: Secondary | ICD-10-CM | POA: Diagnosis not present

## 2022-08-07 DIAGNOSIS — K635 Polyp of colon: Secondary | ICD-10-CM | POA: Diagnosis not present

## 2022-08-07 DIAGNOSIS — K219 Gastro-esophageal reflux disease without esophagitis: Secondary | ICD-10-CM | POA: Diagnosis not present

## 2022-08-07 DIAGNOSIS — I2510 Atherosclerotic heart disease of native coronary artery without angina pectoris: Secondary | ICD-10-CM | POA: Insufficient documentation

## 2022-08-07 DIAGNOSIS — Z955 Presence of coronary angioplasty implant and graft: Secondary | ICD-10-CM | POA: Diagnosis not present

## 2022-08-07 HISTORY — PX: POLYPECTOMY: SHX5525

## 2022-08-07 HISTORY — PX: COLONOSCOPY WITH PROPOFOL: SHX5780

## 2022-08-07 SURGERY — COLONOSCOPY WITH PROPOFOL
Anesthesia: General

## 2022-08-07 MED ORDER — PROPOFOL 10 MG/ML IV BOLUS
INTRAVENOUS | Status: DC | PRN
  Administered 2022-08-07 (×2): 40 mg via INTRAVENOUS
  Administered 2022-08-07: 80 mg via INTRAVENOUS
  Administered 2022-08-07 (×2): 20 mg via INTRAVENOUS

## 2022-08-07 MED ORDER — LACTATED RINGERS IV SOLN: INTRAVENOUS | Status: DC

## 2022-08-07 MED ORDER — LIDOCAINE HCL (CARDIAC) PF 100 MG/5ML IV SOSY
PREFILLED_SYRINGE | INTRAVENOUS | Status: DC | PRN
  Administered 2022-08-07: 40 mg via INTRAVENOUS

## 2022-08-07 MED ORDER — SODIUM CHLORIDE 0.9 % IV SOLN: INTRAVENOUS | Status: DC

## 2022-08-07 MED ORDER — STERILE WATER FOR IRRIGATION IR SOLN
Status: DC | PRN
  Administered 2022-08-07: 100 mL

## 2022-08-07 SURGICAL SUPPLY — 8 items
GOWN CVR UNV OPN BCK APRN NK (MISCELLANEOUS) ×4 IMPLANT
GOWN ISOL THUMB LOOP REG UNIV (MISCELLANEOUS) ×4
KIT PRC NS LF DISP ENDO (KITS) ×2 IMPLANT
KIT PROCEDURE OLYMPUS (KITS) ×2
MANIFOLD NEPTUNE II (INSTRUMENTS) ×2 IMPLANT
SNARE COLD EXACTO (MISCELLANEOUS) IMPLANT
TRAP ETRAP POLY (MISCELLANEOUS) IMPLANT
WATER STERILE IRR 250ML POUR (IV SOLUTION) ×2 IMPLANT

## 2022-08-07 NOTE — Transfer of Care (Signed)
Immediate Anesthesia Transfer of Care Note  Patient: Kendra Salas  Procedure(s) Performed: COLONOSCOPY WITH PROPOFOL POLYPECTOMY  Patient Location: PACU  Anesthesia Type: No value filed.  Level of Consciousness: awake, alert  and patient cooperative  Airway and Oxygen Therapy: Patient Spontanous Breathing and Patient connected to supplemental oxygen  Post-op Assessment: Post-op Vital signs reviewed, Patient's Cardiovascular Status Stable, Respiratory Function Stable, Patent Airway and No signs of Nausea or vomiting  Post-op Vital Signs: Reviewed and stable  Complications: No notable events documented.

## 2022-08-07 NOTE — H&P (Signed)
Lucilla Lame, MD [facility] [address], [place], [place] [address] Phone: [phone] Fax : [phone]  Primary Care Physician:  Glean Hess, MD Primary Gastroenterologist:  Dr. Allen Norris  Pre-Procedure History & Physical: HPI:  Kendra Salas is a 71 y.o. adult is here for a screening colonoscopy.   Past Medical History:  Diagnosis Date   Anxiety    Basal cell carcinoma 03/07/2008   left dorsum mid forearm   Basal cell carcinoma 04/19/2013   left distal lat ant thigh   Basal cell carcinoma 04/11/2014   right forearm, right post shoulder near deltoid   Basal cell carcinoma 10/12/2018   left mid back 7.0 cm lat to spine   CAD (coronary artery disease)    Clotting disorder ([place])    Dysplastic nevus 02/02/2007   left volar mid to prox forearm   Dysplastic nevus 07/09/2010   right medial breast 1.5 cm med to areola, right med breast parasternal, right post distal deltoid   Eczema    GERD (gastroesophageal reflux disease)    Hyperlipemia    Hypertension    Lymphoma ([place]) 2015   Melanoma in situ ([place]) 12/22/2006   right medial breast, med sup quad   Personal history of chemotherapy 2017   6 months of chemo    Past Surgical History:  Procedure Laterality Date   AUGMENTATION MAMMAPLASTY Bilateral 2003   CARDIAC CATHETERIZATION     X3 - has 9 stents. 4 LAD, 1 posterior, Most recent cath 03/2021   COLONOSCOPY  06/29/2012   repeat 10 yrs - Dr. Candace Cruise   ESOPHAGOGASTRODUODENOSCOPY (EGD) WITH PROPOFOL N/A 11/22/2015   Procedure: ESOPHAGOGASTRODUODENOSCOPY (EGD) WITH PROPOFOL;  Surgeon: Lucilla Lame, MD;  Location: [facility];  Service: Endoscopy;  Laterality: N/A;   FACIAL COSMETIC SURGERY     gender re-affirmation     HERNIA REPAIR     x2   PORTA CATH REMOVAL N/A 04/10/2019   Procedure: PORTA CATH REMOVAL;  Surgeon: Algernon Huxley, MD;  Location: [facility];  Service: Cardiovascular;  Laterality: N/A;    Prior to Admission medications   Medication  Sig Start Date End Date Taking? Authorizing Provider  amLODipine (NORVASC) 5 MG tablet Take 1 tablet (5 mg total) by mouth once daily 02/12/22  Yes Glean Hess, MD  aspirin 81 MG tablet Take 81 mg by mouth daily.   Yes [provider]  clopidogrel (PLAVIX) 75 MG tablet Take 1 tablet (75 mg total) by mouth daily. OFFICE VISIT NEEDED FOR ADDITIONAL REFILLS 06/12/22  Yes Glean Hess, MD  dupilumab (DUPIXENT) 300 MG/2ML prefilled syringe INJECT 1 SYRINGE UNDER THE SKIN AS DIRECTED EVERY OTHER WEEK 05/27/22 05/27/23 Yes [place], Marlena Clipper, MD  estradiol (ESTRACE) 2 MG tablet TAKE 1 TABLET BY MOUTH DAILY. 06/12/22 06/12/23 Yes Glean Hess, MD  finasteride (PROSCAR) 5 MG tablet TAKE 1 TABLET BY MOUTH DAILY. 06/12/22 06/12/23 Yes Glean Hess, MD  isosorbide mononitrate (IMDUR) 30 MG 24 hr tablet Take 1 tablet (30 mg total) by mouth once daily 02/02/22  Yes Glean Hess, MD  pantoprazole (PROTONIX) 40 MG tablet TAKE 1 TABLET (40 MG TOTAL) BY MOUTH DAILY. 06/12/22 06/12/23 Yes Glean Hess, MD  rosuvastatin (CRESTOR) 40 MG tablet Take 1 tablet (40 mg total) by mouth once daily 10/29/21  Yes   Ruxolitinib Phosphate (OPZELURA) 1.5 % CREA Apply 1 application topically 2 (two) times daily. 04/03/21  Yes Ralene Bathe, MD  triamcinolone cream (KENALOG)  0.1 % Apply 1 application topically 2 (two) times daily. To rash on legs 06/27/20  Yes Glean Hess, MD  valsartan (DIOVAN) 160 MG tablet Take 1 tablet (160 mg total) by mouth once daily 02/02/22  Yes Glean Hess, MD  cephALEXin (KEFLEX) 500 MG capsule Take 1 capsule (500 mg total) by mouth 3 (three) times daily. Patient not taking: Reported on 08/07/2022 07/02/22   Glean Hess, MD  guaiFENesin-codeine [facility]) 100-10 MG/5ML syrup Take 5 mLs by mouth 3 (three) times daily as needed for cough. Patient not taking: Reported on 03/18/2022 10/21/21   Glean Hess, MD  hyoscyamine (ANASPAZ) 0.125 MG TBDP disintergrating  tablet Place 1 tablet (0.125 mg total) under the tongue every 6 (six) hours as needed. Patient not taking: Reported on 03/18/2022 03/21/19   Glean Hess, MD  meclizine (ANTIVERT) 25 MG tablet Take 1 tablet (25 mg total) by mouth 3 (three) times daily as needed for dizziness. Patient not taking: Reported on 08/03/2022 04/29/22   Glean Hess, MD  mupirocin ointment (BACTROBAN) 2 % Apply 1 Application topically 2 (two) times daily. 04/24/22   Glean Hess, MD  nitroGLYCERIN (NITROSTAT) 0.4 MG SL tablet Place 1 tablet (0.4 mg total) under the tongue every 5 (five) minutes as needed for chest pain. May take up to 3 doses. Patient not taking: Reported on 08/03/2022 10/14/21     nitroGLYCERIN (NITROSTAT) 0.4 MG SL tablet Place under the tongue. Patient not taking: Reported on 03/18/2022 10/14/21 10/14/22  [provider]  nystatin cream (MYCOSTATIN) Apply to the affected area(s) 2 (two) times daily. 09/01/21   Glean Hess, MD  ondansetron (ZOFRAN) 4 MG tablet Take 1 tablet (4 mg total) by mouth every 8 (eight) hours as needed for nausea or vomiting. Patient not taking: Reported on 08/03/2022 05/25/19   Glean Hess, MD    Allergies as of 07/13/2022   (No Known Allergies)    Family History  Problem Relation Age of Onset   Breast cancer Maternal Aunt 72    Social History   Socioeconomic History   Marital status: Married    Spouse name: Not on file   Number of children: Not on file   Years of education: Not on file   Highest education level: Not on file  Occupational History   Not on file  Tobacco Use   Smoking status: Never   Smokeless tobacco: Never  Vaping Use   Vaping Use: Never used  Substance and Sexual Activity   Alcohol use: Yes    Alcohol/week: 2.0 standard drinks of alcohol    Types: 2 Shots of liquor per week    Comment: [place] and Sat   Drug use: Never   Sexual activity: Not on file  Other Topics Concern   Not on file  Social  History Narrative   Not on file   Social Determinants of Health   Financial Resource Strain: Low Risk  (04/10/2019)   Overall Financial Resource Strain (CARDIA)    Difficulty of Paying Living Expenses: Not hard at all  Food Insecurity: No Food Insecurity (02/03/2022)   Hunger Vital Sign    Worried About Running Out of Food in the Last Year: Never true    [place] in the Last Year: Never true  Transportation Needs: No Transportation Needs (02/03/2022)   PRAPARE - Hydrologist (Medical): No    Lack of Transportation (Non-Medical): No  Physical Activity: Insufficiently Active (02/03/2022)   Exercise Vital Sign    Days of Exercise per Week: 2 days    Minutes of Exercise per Session: 60 min  Stress: Stress Concern Present (02/03/2022)   [place]    Feeling of Stress : Very much  Social Connections: Moderately Integrated (02/03/2022)   Social Connection and Isolation Panel [NHANES]    Frequency of Communication with Friends and Family: More than three times a week    Frequency of Social Gatherings with Friends and Family: More than three times a week    Attends Religious Services: 1 to 4 times per year    Active Member of [employer] or Organizations: No    Attends Archivist Meetings: Never    Marital Status: Married  Human resources officer Violence: Not At Risk (02/03/2022)   Humiliation, Afraid, Rape, and Kick questionnaire    Fear of Current or Ex-Partner: No    Emotionally Abused: No    Physically Abused: No    Sexually Abused: No    Review of Systems: See HPI, otherwise negative ROS  Physical Exam: BP 119/78   Pulse 73   Temp 98.7 F (37.1 C) (Temporal)   Resp 20   Ht 6' (1.829 m)   Wt 84.8 kg   SpO2 97%   BMI 25.36 kg/m  General:   Alert,  pleasant and cooperative in NAD Head:  Normocephalic and atraumatic. Neck:  Supple; no masses or thyromegaly. Lungs:  Clear throughout to  auscultation.    Heart:  Regular rate and rhythm. Abdomen:  Soft, nontender and nondistended. Normal bowel sounds, without guarding, and without rebound.   Neurologic:  Alert and  oriented x4;  grossly normal neurologically.  Impression/Plan: RAVON MCILHENNY is now here to undergo a screening colonoscopy.  Risks, benefits, and alternatives regarding colonoscopy have been reviewed with the patient.  Questions have been answered.  All parties agreeable.

## 2022-08-07 NOTE — Anesthesia Preprocedure Evaluation (Signed)
Anesthesia Evaluation  Patient identified by MRN, date of birth, ID band Patient awake    Reviewed: Allergy & Precautions, NPO status , Patient's Chart, lab work & pertinent test results  History of Anesthesia Complications Negative for: history of anesthetic complications  Airway Mallampati: II  TM Distance: >3 FB Neck ROM: Full    Dental no notable dental hx. (+) Teeth Intact   Pulmonary neg pulmonary ROS, neg sleep apnea, neg COPD, Patient abstained from smoking.Not current smoker   Pulmonary exam normal breath sounds clear to auscultation       Cardiovascular Exercise Tolerance: Good METShypertension, Pt. on medications + CAD and + Cardiac Stents  (-) Past MI (-) dysrhythmias  Rhythm:Regular Rate:Normal - Systolic murmurs Multiple cardiac stents, last placed in 2022.  Stress test 2023 unremarkable   Neuro/Psych  PSYCHIATRIC DISORDERS Anxiety     negative neurological ROS     GI/Hepatic ,GERD  Controlled,,(+)     (-) substance abuse    Endo/Other  neg diabetes    Renal/GU negative Renal ROS     Musculoskeletal   Abdominal   Peds  Hematology   Anesthesia Other Findings Past Medical History: No date: Anxiety 03/07/2008: Basal cell carcinoma     Comment:  left dorsum mid forearm 04/19/2013: Basal cell carcinoma     Comment:  left distal lat ant thigh 04/11/2014: Basal cell carcinoma     Comment:  right forearm, right post shoulder near deltoid 10/12/2018: Basal cell carcinoma     Comment:  left mid back 7.0 cm lat to spine No date: CAD (coronary artery disease) No date: Clotting disorder ([place]) 02/02/2007: Dysplastic nevus     Comment:  left volar mid to prox forearm 07/09/2010: Dysplastic nevus     Comment:  right medial breast 1.5 cm med to areola, right med               breast parasternal, right post distal deltoid No date: Eczema No date: GERD (gastroesophageal reflux disease) No date:  Hyperlipemia No date: Hypertension 2015: Lymphoma ([place]) 12/22/2006: Melanoma in situ ([place])     Comment:  right medial breast, med sup quad 2017: Personal history of chemotherapy     Comment:  6 months of chemo  Reproductive/Obstetrics Transgender patient, sex assigned at birth = female.                               Anesthesia Physical Anesthesia Plan  ASA: 3  Anesthesia Plan: General   Post-op Pain Management: Minimal or no pain anticipated   Induction: Intravenous  PONV Risk Score and Plan: 3 and Propofol infusion, TIVA and Ondansetron  Airway Management Planned: Nasal Cannula  Additional Equipment: None  Intra-op Plan:   Post-operative Plan:   Informed Consent: I have reviewed the patients History and Physical, chart, labs and discussed the procedure including the risks, benefits and alternatives for the proposed anesthesia with the patient or authorized representative who has indicated his/her understanding and acceptance.     Dental advisory given  Plan Discussed with: CRNA and Surgeon  Anesthesia Plan Comments: (Discussed risks of anesthesia with patient, including possibility of difficulty with spontaneous ventilation under anesthesia necessitating airway intervention, PONV, and rare risks such as cardiac or respiratory or neurological events, and allergic reactions. Discussed the role of CRNA in patient's perioperative care. Patient understands.)         Anesthesia Quick Evaluation

## 2022-08-07 NOTE — Op Note (Signed)
[facility] Gastroenterology Patient Name: Kendra Salas Procedure Date: 08/04/2022 10:15 AM MRN: [id number] Account #: [id number] Date of Birth: [birth year] Admit Type: Outpatient Age: 71 Room: [facility] OR ROOM 01 Gender: Female Note Status: Finalized Instrument Name: [id number] Procedure:             Colonoscopy Indications:           Screening for colorectal malignant neoplasm Providers:             Lucilla Lame MD, MD Referring MD:          Halina Maidens, MD (Referring MD) Medicines:             Propofol per Anesthesia Complications:         No immediate complications. Procedure:             Pre-Anesthesia Assessment:                        - Prior to the procedure, a History and Physical was                         performed, and patient medications and allergies were                         reviewed. The patient's tolerance of previous                         anesthesia was also reviewed. The risks and benefits                         of the procedure and the sedation options and risks                         were discussed with the patient. All questions were                         answered, and informed consent was obtained. Prior                         Anticoagulants: The patient has taken no anticoagulant                         or antiplatelet agents. ASA Grade Assessment: II - A                         patient with mild systemic disease. After reviewing                         the risks and benefits, the patient was deemed in                         satisfactory condition to undergo the procedure.                        After obtaining informed consent, the colonoscope was                         passed under direct vision. Throughout the procedure,  the patient's blood pressure, pulse, and oxygen                         saturations were monitored continuously. The                         Colonoscope was introduced through the anus and                          advanced to the the cecum, identified by appendiceal                         orifice and ileocecal valve. The colonoscopy was                         performed without difficulty. The patient tolerated                         the procedure well. The quality of the bowel                         preparation was excellent. Findings:      Skin tags were found on perianal exam.      Hemorrhoids were found on perianal exam.      An 8 mm polyp was found in the sigmoid colon. The polyp was       pedunculated. The polyp was removed with a cold snare. Resection and       retrieval were complete.      A 6 mm polyp was found in the descending colon. The polyp was sessile.       The polyp was removed with a cold snare. Resection and retrieval were       complete.      Multiple small-mouthed diverticula were found in the entire colon.      Non-bleeding internal hemorrhoids were found. The hemorrhoids were Grade       III (internal hemorrhoids that prolapse but require manual reduction). Impression:            - Perianal skin tags found on perianal exam.                        - Hemorrhoids found on perianal exam.                        - One 8 mm polyp in the sigmoid colon, removed with a                         cold snare. Resected and retrieved.                        - One 6 mm polyp in the descending colon, removed with                         a cold snare. Resected and retrieved.                        - Diverticulosis in the entire examined colon.                        -  Non-bleeding internal hemorrhoids. Recommendation:        - Discharge patient to home.                        - Resume previous diet.                        - Continue present medications.                        - Await pathology results.                        - If the pathology report reveals adenomatous tissue,                         then repeat the colonoscopy for surveillance in 7                          years. Procedure Code(s):     --- Professional ---                        [phone], Colonoscopy, flexible; with removal of                         tumor(s), polyp(s), or other lesion(s) by snare                         technique Diagnosis Code(s):     --- Professional ---                        Z12.11, Encounter for screening for malignant neoplasm                         of colon                        D12.5, Benign neoplasm of sigmoid colon                        D12.4, Benign neoplasm of descending colon CPT copyright 2022 American Medical Association. All rights reserved. The codes documented in this report are preliminary and upon coder review may  be revised to meet current compliance requirements. Lucilla Lame MD, MD 08/07/2022 8:01:41 AM This report has been signed electronically. Number of Addenda: 0 Note Initiated On: 08/04/2022 10:15 AM Scope Withdrawal Time: 0 hours 6 minutes 11 seconds  Total Procedure Duration: 0 hours 13 minutes 49 seconds  Estimated Blood Loss:  Estimated blood loss: none.      [facility]

## 2022-08-07 NOTE — Anesthesia Postprocedure Evaluation (Signed)
Anesthesia Post Note  Patient: Kendra Salas  Procedure(s) Performed: COLONOSCOPY WITH PROPOFOL POLYPECTOMY  Patient location during evaluation: PACU Anesthesia Type: General Level of consciousness: awake and alert Pain management: pain level controlled Vital Signs Assessment: post-procedure vital signs reviewed and stable Respiratory status: spontaneous breathing, nonlabored ventilation, respiratory function stable and patient connected to nasal cannula oxygen Cardiovascular status: blood pressure returned to baseline and stable Postop Assessment: no apparent nausea or vomiting Anesthetic complications: no   No notable events documented.   Last Vitals:  Vitals:   08/07/22 0800 08/07/22 0813  BP: 106/83 100/78  Pulse:    Resp: (!) 22 (!) 21  Temp: (!) 36.4 C (!) 36.2 C  SpO2: 93% 95%    Last Pain:  Vitals:   08/07/22 0813  TempSrc:   PainSc: 0-No pain                 Arita Miss

## 2022-08-10 ENCOUNTER — Encounter: Payer: Self-pay | Admitting: Gastroenterology

## 2022-08-10 LAB — SURGICAL PATHOLOGY

## 2022-08-11 ENCOUNTER — Encounter: Payer: Self-pay | Admitting: Gastroenterology

## 2022-08-12 ENCOUNTER — Other Ambulatory Visit (HOSPITAL_BASED_OUTPATIENT_CLINIC_OR_DEPARTMENT_OTHER): Payer: Self-pay

## 2022-08-24 ENCOUNTER — Other Ambulatory Visit: Payer: Self-pay

## 2022-08-25 ENCOUNTER — Other Ambulatory Visit (HOSPITAL_COMMUNITY): Payer: Self-pay

## 2022-08-31 ENCOUNTER — Other Ambulatory Visit (HOSPITAL_COMMUNITY): Payer: Self-pay

## 2022-09-01 ENCOUNTER — Other Ambulatory Visit (HOSPITAL_COMMUNITY): Payer: Self-pay

## 2022-09-02 ENCOUNTER — Other Ambulatory Visit: Payer: Self-pay

## 2022-09-02 ENCOUNTER — Other Ambulatory Visit (HOSPITAL_COMMUNITY): Payer: Self-pay

## 2022-09-02 MED ORDER — CEPHALEXIN 500 MG PO CAPS: 500.0000 mg | ORAL_CAPSULE | Freq: Three times a day (TID) | ORAL | 0 refills | Status: DC

## 2022-09-03 ENCOUNTER — Other Ambulatory Visit (HOSPITAL_COMMUNITY): Payer: Self-pay

## 2022-09-22 ENCOUNTER — Other Ambulatory Visit: Payer: Self-pay

## 2022-09-23 ENCOUNTER — Ambulatory Visit: Payer: 59 | Admitting: Dermatology

## 2022-09-29 ENCOUNTER — Other Ambulatory Visit: Payer: Self-pay | Admitting: Internal Medicine

## 2022-09-29 ENCOUNTER — Other Ambulatory Visit: Payer: Self-pay

## 2022-09-29 DIAGNOSIS — K449 Diaphragmatic hernia without obstruction or gangrene: Secondary | ICD-10-CM

## 2022-09-29 DIAGNOSIS — Z79890 Hormone replacement therapy (postmenopausal): Secondary | ICD-10-CM

## 2022-09-29 DIAGNOSIS — I25118 Atherosclerotic heart disease of native coronary artery with other forms of angina pectoris: Secondary | ICD-10-CM

## 2022-09-29 MED FILL — Estradiol Tab 2 MG: ORAL | 90 days supply | Qty: 90 | Fill #0 | Status: AC

## 2022-09-29 MED FILL — Clopidogrel Bisulfate Tab 75 MG (Base Equiv): ORAL | 90 days supply | Qty: 90 | Fill #0 | Status: AC

## 2022-09-29 MED FILL — Pantoprazole Sodium EC Tab 40 MG (Base Equiv): ORAL | 90 days supply | Qty: 90 | Fill #0 | Status: AC

## 2022-09-29 MED FILL — Finasteride Tab 5 MG: ORAL | 90 days supply | Qty: 90 | Fill #0 | Status: AC

## 2022-09-30 ENCOUNTER — Other Ambulatory Visit: Payer: Self-pay

## 2022-10-01 ENCOUNTER — Other Ambulatory Visit: Payer: Self-pay

## 2022-10-12 ENCOUNTER — Other Ambulatory Visit: Payer: Self-pay

## 2022-10-21 ENCOUNTER — Other Ambulatory Visit: Payer: Self-pay

## 2022-10-21 DIAGNOSIS — B3731 Acute candidiasis of vulva and vagina: Secondary | ICD-10-CM

## 2022-10-21 MED ORDER — NYSTATIN 100000 UNIT/GM EX CREA: 1.0000 | TOPICAL_CREAM | Freq: Two times a day (BID) | CUTANEOUS | 1 refills | Status: DC

## 2022-10-23 ENCOUNTER — Ambulatory Visit: Payer: Commercial Managed Care - PPO | Attending: Internal Medicine | Admitting: Pharmacist

## 2022-10-23 DIAGNOSIS — Z79899 Other long term (current) drug therapy: Secondary | ICD-10-CM

## 2022-10-23 NOTE — Progress Notes (Signed)
   S: Patient is currently taking Dupixent for atopic dermatitis. Patient is managed by Dr. Nehemiah Massed for this. Had a visit with Dr. Nehemiah Massed in June of this year and Dupixent therapy was continued.   Adherence: denies any missed doses  Efficacy: reports that it is working well for her  Dosing: 300 mg every 14 days  Dose adjustments: Renal: no dose adjustments (has not been studied) Hepatic: no dose adjustments (has not been studied)  Screening: TB test: completed per patient  Monitoring: S/sx of infection: denies S/sx of hypersensitivity: denies S/sx of ocular effects: denies S/sx of eosinophilia/vasculitis: denies  O:  Lab Results  Component Value Date   WBC 7.7 05/18/2022   HGB 12.8 05/18/2022   HCT 38.8 05/18/2022   MCV 82.0 05/18/2022   PLT 269 05/18/2022      Chemistry      Component Value Date/Time   NA 136 03/18/2022 1338   NA 138 01/05/2022 0000   NA 139 10/16/2014 0845   K 3.6 03/18/2022 1338   K 3.7 10/16/2014 0845   CL 104 03/18/2022 1338   CL 107 10/16/2014 0845   CO2 24 03/18/2022 1338   CO2 25 10/16/2014 0845   BUN 13 03/18/2022 1338   BUN 12 01/05/2022 0000   BUN 11 10/16/2014 0845   CREATININE 0.77 03/18/2022 1338   CREATININE 0.85 10/16/2014 0845   GLU 92 01/05/2022 0000      Component Value Date/Time   CALCIUM 8.2 (L) 03/18/2022 1338   CALCIUM 8.1 (L) 10/16/2014 0845   ALKPHOS 41 03/18/2022 1338   ALKPHOS 47 10/16/2014 0845   AST 18 03/18/2022 1338   AST 23 10/16/2014 0845   ALT 16 03/18/2022 1338   ALT 18 10/16/2014 0845   BILITOT 0.6 03/18/2022 1338   BILITOT 0.5 05/04/2019 0803   BILITOT 0.3 10/16/2014 0845       A/P: 1. Medication review: Patient currently on T[place] for eczema and is tolerating it well. Reviewed the medication with the patient, including the following: Dupixent is a monoclonal antibody used for the treatment of asthma or atopic dermatitis. Patient educated on purpose, proper use and potential adverse  effects of Dupixent. Possible adverse effects include increased risk of infection, ocular effects, vasculitis/eosinophilia, and hypersensitivity reactions. No recommendations for any changes.  Benard Halsted, PharmD, [facility] [place], [place] [phone]

## 2022-10-28 ENCOUNTER — Other Ambulatory Visit (HOSPITAL_COMMUNITY): Payer: Self-pay

## 2022-10-30 ENCOUNTER — Other Ambulatory Visit (HOSPITAL_COMMUNITY): Payer: Self-pay

## 2022-11-09 ENCOUNTER — Other Ambulatory Visit (HOSPITAL_COMMUNITY): Payer: Self-pay

## 2022-11-09 ENCOUNTER — Other Ambulatory Visit: Payer: Self-pay | Admitting: Internal Medicine

## 2022-11-09 ENCOUNTER — Other Ambulatory Visit: Payer: Self-pay

## 2022-11-09 DIAGNOSIS — H109 Unspecified conjunctivitis: Secondary | ICD-10-CM

## 2022-11-09 MED ORDER — AMOXICILLIN-POT CLAVULANATE 875-125 MG PO TABS: 1.0000 | ORAL_TABLET | Freq: Two times a day (BID) | ORAL | 0 refills | Status: AC

## 2022-11-09 MED ORDER — SULFACETAMIDE SODIUM 10 % OP SOLN: 1.0000 [drp] | OPHTHALMIC | 0 refills | Status: DC

## 2022-11-18 ENCOUNTER — Ambulatory Visit: Payer: 59 | Admitting: Dermatology

## 2022-12-01 ENCOUNTER — Other Ambulatory Visit (HOSPITAL_COMMUNITY): Payer: Self-pay

## 2022-12-02 ENCOUNTER — Ambulatory Visit: Payer: Commercial Managed Care - PPO | Admitting: Dermatology

## 2022-12-02 ENCOUNTER — Other Ambulatory Visit: Payer: Self-pay

## 2022-12-02 VITALS — BP 113/84 | HR 92

## 2022-12-02 DIAGNOSIS — Z79899 Other long term (current) drug therapy: Secondary | ICD-10-CM | POA: Diagnosis not present

## 2022-12-02 DIAGNOSIS — L2081 Atopic neurodermatitis: Secondary | ICD-10-CM | POA: Diagnosis not present

## 2022-12-02 MED ORDER — OPZELURA 1.5 % EX CREA: 1.0000 | TOPICAL_CREAM | CUTANEOUS | 11 refills | Status: AC

## 2022-12-02 NOTE — Patient Instructions (Addendum)
Appointment is 06/16/23 at 3:30  Due to recent changes in healthcare laws, you may see results of your pathology and/or laboratory studies on MyChart before the doctors have had a chance to review them. We understand that in some cases there may be results that are confusing or concerning to you. Please understand that not all results are received at the same time and often the doctors may need to interpret multiple results in order to provide you with the best plan of care or course of treatment. Therefore, we ask that you please give [place] 2 business days to thoroughly review all your results before contacting the office for clarification. Should we see a critical lab result, you will be contacted sooner.   If You Need Anything After Your Visit  If you have any questions or concerns for your doctor, please call our main line at [phone] and press option 4 to reach your doctor's medical assistant. If no one answers, please leave a voicemail as directed and we will return your call as soon as possible. Messages left after 4 pm will be answered the following business day.   You may also send [place] a message via [place]. We typically respond to MyChart messages within 1-2 business days.  For prescription refills, please ask your pharmacy to contact our office. Our fax number is [phone].  If you have an urgent issue when the clinic is closed that cannot wait until the next business day, you can page your doctor at the number below.    Please note that while we do our best to be available for urgent issues outside of office hours, we are not available 24/7.   If you have an urgent issue and are unable to reach [place], you may choose to seek medical care at your doctor's office, retail clinic, urgent care center, or emergency room.  If you have a medical emergency, please immediately call 911 or go to the emergency department.  Pager Numbers  - Dr. Nehemiah Massed: [phone]  - Dr. Laurence Ferrari:  [phone]  - Dr. Nicole Kindred: [phone]  In the event of inclement weather, please call our main line at [phone] for an update on the status of any delays or closures.  Dermatology Medication Tips: Please keep the boxes that topical medications come in in order to help keep track of the instructions about where and how to use these. Pharmacies typically print the medication instructions only on the boxes and not directly on the medication tubes.   If your medication is too expensive, please contact our office at [phone] option 4 or send [place] a message through [place].   We are unable to tell what your co-pay for medications will be in advance as this is different depending on your insurance coverage. However, we may be able to find a substitute medication at lower cost or fill out paperwork to get insurance to cover a needed medication.   If a prior authorization is required to get your medication covered by your insurance company, please allow [place] 1-2 business days to complete this process.  Drug prices often vary depending on where the prescription is filled and some pharmacies may offer cheaper prices.  The website www.goodrx.com contains coupons for medications through different pharmacies. The prices here do not account for what the cost may be with help from insurance (it may be cheaper with your insurance), but the website can give you the price if you did not use any insurance.  - You can print the  associated coupon and take it with your prescription to the pharmacy.  - You may also stop by our office during regular business hours and pick up a GoodRx coupon card.  - If you need your prescription sent electronically to a different pharmacy, notify our office through [facility] or by phone at [phone] option 4.     Si Usted Necesita Algo Despus de Su Visita  Tambin puede enviarnos un mensaje a travs de Pharmacist, community. Por lo general respondemos a los mensajes de  MyChart en el transcurso de 1 a 2 das hbiles.  Para renovar recetas, por favor pida a su farmacia que se ponga en contacto con nuestra oficina. Harland Dingwall de fax es [place] [phone].  Si tiene un asunto urgente cuando la clnica est cerrada y que no puede esperar hasta el siguiente da hbil, puede llamar/localizar a su doctor(a) al nmero que aparece a continuacin.   Por favor, tenga en cuenta que aunque hacemos todo lo posible para estar disponibles para asuntos urgentes fuera del horario de [place], no estamos disponibles las 24 horas del da, los 7 das de la [place].   Si tiene un problema urgente y no puede comunicarse con nosotros, puede optar por buscar atencin mdica  en el consultorio de su doctor(a), en una clnica privada, en un centro de atencin urgente o en una sala de emergencias.  Si tiene Engineering geologist, por favor llame inmediatamente al 911 o vaya a la sala de emergencias.  Nmeros de bper  - Dr. Nehemiah Massed: [phone]  - Dra. Moye: [phone]  - Dra. Nicole Kindred: [phone]  En caso de inclemencias del [place], por favor llame a Johnsie Kindred principal al [phone] para una actualizacin sobre el [place] de cualquier retraso o cierre.  Consejos para la medicacin en dermatologa: Por favor, guarde las cajas en las que vienen los medicamentos de uso tpico para ayudarle a seguir las instrucciones sobre dnde y cmo usarlos. Las farmacias generalmente imprimen las instrucciones del medicamento slo en las cajas y no directamente en los tubos del [place].   Si su medicamento es muy caro, por favor, pngase en contacto con Zigmund Daniel llamando al [phone] y presione la opcin 4 o envenos un mensaje a travs de Pharmacist, community.   No podemos decirle cul ser su copago por los medicamentos por adelantado ya que esto es diferente dependiendo de la cobertura de su seguro. Sin embargo, es posible que podamos encontrar un medicamento sustituto a Contractor un formulario para que el seguro cubra el medicamento que se considera necesario.   Si se requiere una autorizacin previa para que su compaa de seguros [place] su medicamento, por favor permtanos de 1 a 2 das hbiles para completar este proceso.  Los precios de los medicamentos varan con frecuencia dependiendo del Environmental consultant de dnde se surte la receta y alguna farmacias pueden ofrecer precios ms baratos.  El sitio web www.goodrx.com tiene cupones para medicamentos de Airline pilot. Los precios aqu no tienen en cuenta lo que podra costar con la ayuda del seguro (puede ser ms barato con su seguro), pero el sitio web puede darle el precio si no utiliz Research scientist (physical sciences).  - Puede imprimir el cupn correspondiente y llevarlo con su receta a la farmacia.  - Tambin puede pasar por nuestra oficina durante el horario de atencin regular y Charity fundraiser una tarjeta de cupones de GoodRx.  - Si necesita que su receta se enve electrnicamente a Chiropodist, informe a nuestra oficina a  Lawerance Cruel de MyChart de [place] o por telfono llamando al [phone] y presione la opcin 4.

## 2022-12-02 NOTE — Progress Notes (Signed)
   Follow-Up Visit   Subjective  Kendra Salas is a 72 y.o. adult who presents for the following: Eczema (Trunk, extremities, Dupixent q 2 wks, no se from medications, doing much better).  The following portions of the chart were reviewed this encounter and updated as appropriate:   Tobacco  Allergies  Meds  Problems  Med Hx  Surg Hx  Fam Hx     Review of Systems:  No other skin or systemic complaints except as noted in HPI or Assessment and Plan.  Objective  Well appearing patient in no apparent distress; mood and affect are within normal limits.  A focused examination was performed including buttocks, arms. Relevant physical exam findings are noted in the Assessment and Plan.  trunk, extremities Buttocks clear, R lat antecubital crusting and scale   Assessment & Plan  Atopic neurodermatitis trunk, extremities  Atopic dermatitis - Severe, on Dupixent (biologic medication).  Atopic dermatitis (eczema) is a chronic, relapsing, pruritic condition that can significantly affect quality of life. It is often associated with allergic rhinitis and/or asthma and can require treatment with topical medications, phototherapy, or in severe cases biologic medications, which require long term medication management.    Cont Dupixent '300mg'$ /67m sq injections q 2 wks Cont Opzelura cr qd prn flares  Dupilumab (Dupixent) is a treatment given by injection for adults and children with moderate-to-severe atopic dermatitis. Goal is control of skin condition, not cure. It is given as 2 injections at the first dose followed by 1 injection ever 2 weeks thereafter.  Young children are dosed monthly.  Potential side effects include allergic reaction, herpes infections, injection site reactions and conjunctivitis (inflammation of the eyes).  The use of Dupixent requires long term medication management, including periodic office visits.   Ruxolitinib Phosphate (OPZELURA) 1.5 % CREA - trunk,  extremities Apply 1 Application topically as directed. Qd to aa eczema  Long-term use of high-risk medication  Medication management   Return in about 6 months (around 06/02/2023) for TBSE, Atopic Derm, Hx of Melanoma.  I, SOthelia Pulling RMA, am acting as scribe for DSarina Ser MD . Documentation: I have reviewed the above documentation for accuracy and completeness, and I agree with the above.  DSarina Ser MD

## 2022-12-09 ENCOUNTER — Other Ambulatory Visit: Payer: Self-pay

## 2022-12-09 ENCOUNTER — Other Ambulatory Visit: Payer: Self-pay | Admitting: Internal Medicine

## 2022-12-09 ENCOUNTER — Encounter: Payer: Self-pay | Admitting: Dermatology

## 2022-12-09 MED FILL — Rosuvastatin Calcium Tab 40 MG: ORAL | 90 days supply | Qty: 90 | Fill #0 | Status: AC

## 2022-12-10 ENCOUNTER — Other Ambulatory Visit: Payer: Self-pay

## 2022-12-11 ENCOUNTER — Other Ambulatory Visit (HOSPITAL_COMMUNITY): Payer: Self-pay

## 2022-12-22 LAB — BASIC METABOLIC PANEL
BUN: 12 (ref 4–21)
CO2: 21 (ref 13–22)
Chloride: 105 (ref 99–108)
Creatinine: 0.8
Glucose: 89
Potassium: 3.7 mEq/L (ref 3.5–5.1)
Sodium: 140 (ref 137–147)

## 2022-12-22 LAB — HEPATIC FUNCTION PANEL
ALT: 16 U/L
AST: 17
Alkaline Phosphatase: 54 (ref 25–125)
Bilirubin, Total: 0.3

## 2022-12-22 LAB — LIPID PANEL
Cholesterol: 116 (ref 0–200)
HDL: 47 (ref 35–70)
LDL Cholesterol: 51
Triglycerides: 92 (ref 40–160)

## 2022-12-22 LAB — TSH: TSH: 1.68 (ref 0.41–5.90)

## 2022-12-22 LAB — COMPREHENSIVE METABOLIC PANEL
Albumin: 4.2 (ref 3.5–5.0)
Calcium: 8.8 (ref 8.7–10.7)
Globulin: 2.6
eGFR: 84

## 2022-12-22 LAB — CBC AND DIFFERENTIAL
HCT: 39 (ref 39–52)
Hemoglobin: 13 (ref 13.0–17.0)
Neutrophils Absolute: 61
Platelets: 251 10*3/uL (ref 150–400)
WBC: 6.5

## 2022-12-22 LAB — HEMOGLOBIN A1C: Hemoglobin A1C: 5.6

## 2022-12-22 LAB — CBC: RBC: 4.74 (ref 3.87–5.11)

## 2023-01-05 ENCOUNTER — Other Ambulatory Visit (HOSPITAL_COMMUNITY): Payer: Self-pay

## 2023-01-05 ENCOUNTER — Other Ambulatory Visit: Payer: Self-pay | Admitting: Dermatology

## 2023-01-05 ENCOUNTER — Other Ambulatory Visit: Payer: Self-pay

## 2023-01-05 MED ORDER — DUPIXENT 300 MG/2ML ~~LOC~~ SOSY
PREFILLED_SYRINGE | SUBCUTANEOUS | 6 refills | Status: DC
  Filled 2023-01-05: qty 4, 28d supply, fill #0

## 2023-01-06 ENCOUNTER — Other Ambulatory Visit (HOSPITAL_COMMUNITY): Payer: Self-pay

## 2023-01-08 ENCOUNTER — Other Ambulatory Visit (HOSPITAL_COMMUNITY): Payer: Self-pay

## 2023-01-08 ENCOUNTER — Other Ambulatory Visit: Payer: Self-pay | Admitting: Pharmacist

## 2023-01-08 ENCOUNTER — Other Ambulatory Visit: Payer: Self-pay

## 2023-01-08 MED ORDER — DUPIXENT 300 MG/2ML ~~LOC~~ SOSY
PREFILLED_SYRINGE | SUBCUTANEOUS | 6 refills | Status: DC
  Filled 2023-01-08: qty 4, 28d supply, fill #0
  Filled 2023-02-08: qty 4, 28d supply, fill #1
  Filled 2023-03-11: qty 4, 28d supply, fill #2
  Filled 2023-04-13: qty 4, 28d supply, fill #3
  Filled 2023-05-19: qty 4, 28d supply, fill #4
  Filled 2023-06-18: qty 4, 28d supply, fill #5
  Filled 2023-07-15: qty 4, 28d supply, fill #6

## 2023-01-12 ENCOUNTER — Encounter: Payer: Self-pay | Admitting: Internal Medicine

## 2023-01-12 ENCOUNTER — Ambulatory Visit (INDEPENDENT_AMBULATORY_CARE_PROVIDER_SITE_OTHER): Payer: Commercial Managed Care - PPO | Admitting: Internal Medicine

## 2023-01-12 VITALS — BP 120/70 | HR 76 | Ht 71.0 in | Wt 192.4 lb

## 2023-01-12 DIAGNOSIS — C8334 Diffuse large B-cell lymphoma, lymph nodes of axilla and upper limb: Secondary | ICD-10-CM | POA: Diagnosis not present

## 2023-01-12 DIAGNOSIS — D6869 Other thrombophilia: Secondary | ICD-10-CM | POA: Diagnosis not present

## 2023-01-12 DIAGNOSIS — E782 Mixed hyperlipidemia: Secondary | ICD-10-CM | POA: Diagnosis not present

## 2023-01-12 DIAGNOSIS — Z1231 Encounter for screening mammogram for malignant neoplasm of breast: Secondary | ICD-10-CM | POA: Diagnosis not present

## 2023-01-12 DIAGNOSIS — I10 Essential (primary) hypertension: Secondary | ICD-10-CM | POA: Diagnosis not present

## 2023-01-12 NOTE — Progress Notes (Signed)
Date:  01/12/2023   Name:  Kendra Salas   DOB:  [birth year]   MRN:  [id number]   Chief Complaint: Hypertension  Hypertension This is a chronic problem. The problem is controlled. Pertinent negatives include no chest pain, headaches, palpitations or shortness of breath. Past treatments include calcium channel blockers, direct vasodilators and angiotensin blockers. The current treatment provides significant improvement. Hypertensive end-organ damage includes CAD/MI. There is no history of kidney disease or CVA.  Hyperlipidemia This is a chronic problem. The problem is controlled. Pertinent negatives include no chest pain or shortness of breath. Current antihyperlipidemic treatment includes statins. The current treatment provides significant improvement of lipids.  Lymphoma - no symptoms noted.  She is monitoring herself closely for systemic symptoms.  Oncology has released her from routine follow up.  Lab Results  Component Value Date   NA 140 12/22/2022   K 3.7 12/22/2022   CO2 21 12/22/2022   GLUCOSE 93 03/18/2022   BUN 12 12/22/2022   CREATININE 0.8 12/22/2022   CALCIUM 8.8 12/22/2022   EGFR 84 12/22/2022   GFRNONAA >60 03/18/2022   Lab Results  Component Value Date   CHOL 116 12/22/2022   HDL 47 12/22/2022   LDLCALC 51 12/22/2022   TRIG 92 12/22/2022   CHOLHDL 3.2 05/04/2019   Lab Results  Component Value Date   TSH 1.68 12/22/2022   Lab Results  Component Value Date   HGBA1C 5.6 12/22/2022   Lab Results  Component Value Date   WBC 6.5 12/22/2022   HGB 13.0 12/22/2022   HCT 39 12/22/2022   MCV 82.0 05/18/2022   PLT 251 12/22/2022   Lab Results  Component Value Date   ALT 16 12/22/2022   AST 17 12/22/2022   ALKPHOS 54 12/22/2022   BILITOT 0.6 03/18/2022   No results found for: "25OHVITD2", "25OHVITD3", "VD25OH"   Review of Systems  Constitutional:  Positive for fatigue. Negative for appetite change, chills, diaphoresis and unexpected weight change.   HENT:  Negative for nosebleeds and trouble swallowing.   Eyes:  Negative for visual disturbance.  Respiratory:  Negative for cough, chest tightness, shortness of breath and wheezing.   Cardiovascular:  Negative for chest pain, palpitations and leg swelling.  Gastrointestinal:  Negative for abdominal pain, constipation and diarrhea.  Musculoskeletal:  Negative for arthralgias.  Skin:  Positive for color change and rash.  Neurological:  Negative for dizziness, weakness, light-headedness and headaches.  Psychiatric/Behavioral:  Negative for dysphoric mood and sleep disturbance. The patient is not nervous/anxious.     Patient Active Problem List   Diagnosis Date Noted   Acquired thrombophilia 01/12/2023   Polyp of descending colon 08/07/2022   S/P angioplasty with stent 03/17/2021   Hx of melanoma of skin 05/22/2020   Psoriasis 03/30/2018   Mixed hyperlipidemia 03/31/2016   Breathlessness on exertion 03/31/2016   Screen for colon cancer    Benign neoplasm of ascending colon    CAD (coronary artery disease) 03/03/2015   Diverticulosis 03/03/2015   Benign essential HTN 03/03/2015   Diffuse large B-cell lymphoma of lymph nodes of axilla 02/10/2015    No Known Allergies  Past Surgical History:  Procedure Laterality Date   AUGMENTATION MAMMAPLASTY Bilateral 2003   CARDIAC CATHETERIZATION     X3 - has 9 stents. 4 LAD, 1 posterior, Most recent cath 03/2021   COLONOSCOPY  06/29/2012   repeat 10 yrs - Dr. Bluford Kaufmann   COLONOSCOPY WITH PROPOFOL N/A 08/07/2022   Procedure: COLONOSCOPY WITH PROPOFOL;  Surgeon: Midge Minium, MD;  Location: [facility];  Service: Endoscopy;  Laterality: N/A;  needs to be first case   ESOPHAGOGASTRODUODENOSCOPY (EGD) WITH PROPOFOL N/A 11/22/2015   Procedure: ESOPHAGOGASTRODUODENOSCOPY (EGD) WITH PROPOFOL;  Surgeon: Midge Minium, MD;  Location: [facility];  Service: Endoscopy;  Laterality: N/A;   FACIAL COSMETIC SURGERY     gender re-affirmation      HERNIA REPAIR     x2   POLYPECTOMY  08/07/2022   Procedure: POLYPECTOMY;  Surgeon: Midge Minium, MD;  Location: [facility];  Service: Endoscopy;;   PORTA CATH REMOVAL N/A 04/10/2019   Procedure: PORTA CATH REMOVAL;  Surgeon: Annice Needy, MD;  Location: [facility] INVASIVE CV LAB;  Service: Cardiovascular;  Laterality: N/A;    Social History   Tobacco Use   Smoking status: Never   Smokeless tobacco: Never  Vaping Use   Vaping Use: Never used  Substance Use Topics   Alcohol use: Yes    Alcohol/week: 2.0 standard drinks of alcohol    Types: 2 Shots of liquor per week    Comment: 1 Margarita - Wed and Sat   Drug use: Never     Medication list has been reviewed and updated.  Current Meds  Medication Sig   amLODipine (NORVASC) 5 MG tablet Take 1 tablet (5 mg total) by mouth once daily   aspirin 81 MG tablet Take 81 mg by mouth daily.   clopidogrel (PLAVIX) 75 MG tablet Take 1 tablet (75 mg total) by mouth daily. OFFICE VISIT NEEDED FOR ADDITIONAL REFILLS   dupilumab (DUPIXENT) 300 MG/2ML prefilled syringe INJECT 1 SYRINGE UNDER THE SKIN AS DIRECTED EVERY OTHER WEEK   estradiol (ESTRACE) 2 MG tablet TAKE 1 TABLET BY MOUTH DAILY.   finasteride (PROSCAR) 5 MG tablet TAKE 1 TABLET BY MOUTH DAILY.   hyoscyamine (ANASPAZ) 0.125 MG TBDP disintergrating tablet Place 1 tablet (0.125 mg total) under the tongue every 6 (six) hours as needed.   isosorbide mononitrate (IMDUR) 30 MG 24 hr tablet Take 1 tablet (30 mg total) by mouth once daily   meclizine (ANTIVERT) 25 MG tablet Take 1 tablet (25 mg total) by mouth 3 (three) times daily as needed for dizziness.   nitroGLYCERIN (NITROSTAT) 0.4 MG SL tablet Place under the tongue.   nystatin cream (MYCOSTATIN) Apply to the affected area(s) 2 (two) times daily.   ondansetron (ZOFRAN) 4 MG tablet Take 1 tablet (4 mg total) by mouth every 8 (eight) hours as needed for nausea or vomiting.   pantoprazole (PROTONIX) 40 MG tablet TAKE 1 TABLET (40 MG  TOTAL) BY MOUTH DAILY.   rosuvastatin (CRESTOR) 40 MG tablet Take 1 tablet (40 mg total) by mouth daily.   Ruxolitinib Phosphate (OPZELURA) 1.5 % CREA Apply 1 Application topically as directed. Qd to aa eczema   sulfacetamide (BLEPH-10) 10 % ophthalmic solution Place 1 drop into both eyes every 3 (three) hours.   triamcinolone cream (KENALOG) 0.1 % Apply 1 application topically 2 (two) times daily. To rash on legs   valsartan (DIOVAN) 160 MG tablet Take 1 tablet (160 mg total) by mouth once daily   [DISCONTINUED] guaiFENesin-codeine (ROBITUSSIN AC) 100-10 MG/5ML syrup Take 5 mLs by mouth 3 (three) times daily as needed for cough.   [DISCONTINUED] nitroGLYCERIN (NITROSTAT) 0.4 MG SL tablet Place 1 tablet (0.4 mg total) under the tongue every 5 (five) minutes as needed for chest pain. May take up to 3 doses.       01/12/2023   11:12  AM 02/03/2022    3:13 PM 06/04/2021   11:13 AM 05/22/2020    1:55 PM  GAD 7 : Generalized Anxiety Score  Nervous, Anxious, on Edge 0 0 0 0  Control/stop worrying 0 0 0 0  Worry too much - different things 0 0 0 0  Trouble relaxing 0 0 0 0  Restless 0 0 0 0  Easily annoyed or irritable 0 0 0 0  Afraid - awful might happen 0 0 0 0  Total GAD 7 Score 0 0 0 0  Anxiety Difficulty Not difficult at all Not difficult at all  Not difficult at all       01/12/2023   11:11 AM 02/03/2022    3:13 PM 06/04/2021   11:12 AM  Depression screen PHQ 2/9  Decreased Interest 1 0 0  Down, Depressed, Hopeless 1 0 0  PHQ - 2 Score 2 0 0  Altered sleeping 3 3 0  Tired, decreased energy 2 3 0  Change in appetite 0 0 0  Feeling bad or failure about yourself  0 0 0  Trouble concentrating 0 0 0  Moving slowly or fidgety/restless 0 0 0  Suicidal thoughts 0 0 0  PHQ-9 Score 7 6 0  Difficult doing work/chores Not difficult at all Somewhat difficult Not difficult at all    BP Readings from Last 3 Encounters:  01/12/23 120/70  12/02/22 113/84  08/07/22 100/78    Physical  Exam Vitals and nursing note reviewed.  Constitutional:      General: She is not in acute distress.    Appearance: Normal appearance. She is well-developed.  HENT:     Head: Normocephalic and atraumatic.  Neck:     Vascular: No carotid bruit.  Cardiovascular:     Rate and Rhythm: Normal rate and regular rhythm.     Pulses: Normal pulses.     Heart sounds: No murmur heard. Pulmonary:     Effort: Pulmonary effort is normal. No respiratory distress.     Breath sounds: No wheezing or rhonchi.  Abdominal:     General: Abdomen is flat. Bowel sounds are normal.     Palpations: Abdomen is soft. There is no hepatomegaly or splenomegaly.     Tenderness: There is no abdominal tenderness. There is no guarding or rebound.  Musculoskeletal:     Cervical back: Normal range of motion.     Right lower leg: No edema.     Left lower leg: No edema.  Lymphadenopathy:     Cervical: No cervical adenopathy.  Skin:    General: Skin is warm and dry.     Capillary Refill: Capillary refill takes less than 2 seconds.     Findings: No rash.  Neurological:     General: No focal deficit present.     Mental Status: She is alert and oriented to person, place, and time.  Psychiatric:        Mood and Affect: Mood normal.        Behavior: Behavior normal.     Wt Readings from Last 3 Encounters:  01/12/23 192 lb 6.4 oz (87.3 kg)  08/07/22 187 lb (84.8 kg)  03/18/22 193 lb 9.6 oz (87.8 kg)    BP 120/70   Pulse 76   Ht 5\' 11"  (1.803 m)   Wt 192 lb 6.4 oz (87.3 kg)   SpO2 96%   BMI 26.83 kg/m   Assessment and Plan:  Problem List Items Addressed This Visit  Cardiovascular and Mediastinum   Benign essential HTN - Primary    Clinically stable exam with well controlled BP on amlodipine, Imdur and valsartan. Tolerating medications without side effects. Pt to continue current regimen and low sodium diet.         Immune and Lymphatic   Diffuse large B-cell lymphoma of lymph nodes of axilla     In Remission Followed by Oncology annually until last year Now to return PRN - continue monitoring for sweats, weight loss or other systemic symptoms         Hematopoietic and Hemostatic   Acquired thrombophilia    Taking Plavix daily with ASA due to coronary stents        Other   Mixed hyperlipidemia    Tolerating statin medications without concerns LDL is  Lab Results  Component Value Date   LDLCALC 51 12/22/2022  On Crestor 40 mg. with a goal of < 70.        Other Visit Diagnoses     Encounter for screening mammogram for breast cancer       Relevant Orders   MM 3D SCREENING MAMMOGRAM BILATERAL BREAST       Return in about 6 months (around 07/14/2023) for HTN, CPX.   Partially dictated using Dragon software, any errors are not intentional.  Reubin MilanLaura H. Sung Parodi, MD [facility] [place], [place]

## 2023-01-12 NOTE — Assessment & Plan Note (Signed)
Clinically stable exam with well controlled BP on amlodipine, Imdur and valsartan. Tolerating medications without side effects. Pt to continue current regimen and low sodium diet.

## 2023-01-12 NOTE — Assessment & Plan Note (Signed)
Taking Plavix daily with ASA due to coronary stents

## 2023-01-12 NOTE — Assessment & Plan Note (Addendum)
In Remission Followed by Oncology annually until last year Now to return PRN - continue monitoring for sweats, weight loss or other systemic symptoms

## 2023-01-12 NOTE — Assessment & Plan Note (Signed)
Tolerating statin medications without concerns LDL is  Lab Results  Component Value Date   LDLCALC 51 12/22/2022  On Crestor 40 mg. with a goal of < 70.

## 2023-01-13 ENCOUNTER — Other Ambulatory Visit: Payer: Self-pay

## 2023-01-13 MED FILL — Finasteride Tab 5 MG: ORAL | 90 days supply | Qty: 90 | Fill #1 | Status: AC

## 2023-01-13 MED FILL — Pantoprazole Sodium EC Tab 40 MG (Base Equiv): ORAL | 90 days supply | Qty: 90 | Fill #1 | Status: AC

## 2023-01-13 MED FILL — Clopidogrel Bisulfate Tab 75 MG (Base Equiv): ORAL | 90 days supply | Qty: 90 | Fill #1 | Status: AC

## 2023-01-13 MED FILL — Estradiol Tab 2 MG: ORAL | 90 days supply | Qty: 90 | Fill #1 | Status: AC

## 2023-01-14 ENCOUNTER — Other Ambulatory Visit (HOSPITAL_COMMUNITY): Payer: Self-pay

## 2023-01-19 ENCOUNTER — Other Ambulatory Visit (HOSPITAL_COMMUNITY): Payer: Self-pay

## 2023-02-04 ENCOUNTER — Other Ambulatory Visit: Payer: Self-pay

## 2023-02-08 ENCOUNTER — Other Ambulatory Visit: Payer: Self-pay

## 2023-03-09 ENCOUNTER — Other Ambulatory Visit: Payer: Self-pay

## 2023-03-09 ENCOUNTER — Other Ambulatory Visit: Payer: Self-pay | Admitting: Internal Medicine

## 2023-03-09 ENCOUNTER — Ambulatory Visit
Admission: RE | Admit: 2023-03-09 | Discharge: 2023-03-09 | Disposition: A | Discharge status: Home / Self Care | Payer: Commercial Managed Care - PPO | Source: Ambulatory Visit | Attending: Internal Medicine | Admitting: Internal Medicine

## 2023-03-09 DIAGNOSIS — I25118 Atherosclerotic heart disease of native coronary artery with other forms of angina pectoris: Secondary | ICD-10-CM

## 2023-03-09 DIAGNOSIS — Z1231 Encounter for screening mammogram for malignant neoplasm of breast: Secondary | ICD-10-CM | POA: Insufficient documentation

## 2023-03-09 MED FILL — Amlodipine Besylate Tab 5 MG (Base Equivalent): ORAL | 90 days supply | Qty: 90 | Fill #0 | Status: AC

## 2023-03-09 MED FILL — Isosorbide Mononitrate Tab ER 24HR 30 MG: ORAL | 90 days supply | Qty: 90 | Fill #0 | Status: AC

## 2023-03-11 ENCOUNTER — Other Ambulatory Visit (HOSPITAL_COMMUNITY): Payer: Self-pay

## 2023-03-11 ENCOUNTER — Other Ambulatory Visit: Payer: Self-pay

## 2023-03-15 ENCOUNTER — Other Ambulatory Visit (HOSPITAL_COMMUNITY): Payer: Self-pay

## 2023-03-19 ENCOUNTER — Other Ambulatory Visit: Payer: 59

## 2023-03-19 ENCOUNTER — Ambulatory Visit: Payer: 59 | Admitting: Oncology

## 2023-03-24 ENCOUNTER — Other Ambulatory Visit: Payer: Self-pay

## 2023-03-24 MED FILL — Rosuvastatin Calcium Tab 40 MG: ORAL | 90 days supply | Qty: 90 | Fill #1 | Status: AC

## 2023-03-26 ENCOUNTER — Other Ambulatory Visit: Payer: Self-pay

## 2023-04-13 ENCOUNTER — Other Ambulatory Visit (HOSPITAL_COMMUNITY): Payer: Self-pay

## 2023-04-14 ENCOUNTER — Encounter: Payer: Self-pay | Admitting: Cardiology

## 2023-04-14 ENCOUNTER — Ambulatory Visit: Payer: Commercial Managed Care - PPO | Attending: Cardiology | Admitting: Cardiology

## 2023-04-14 VITALS — BP 120/87 | HR 76 | Ht 71.0 in | Wt 192.8 lb

## 2023-04-14 DIAGNOSIS — I10 Essential (primary) hypertension: Secondary | ICD-10-CM

## 2023-04-14 DIAGNOSIS — I25118 Atherosclerotic heart disease of native coronary artery with other forms of angina pectoris: Secondary | ICD-10-CM | POA: Diagnosis not present

## 2023-04-14 NOTE — Progress Notes (Signed)
Cardiology Office Note:    Date:  04/14/2023   ID:  Kendra Salas, DOB [birth year], MRN [id number]  PCP:  Kendra Milan, MD    HeartCare Providers Cardiologist:  Debbe Odea, MD     Referring MD: Kendra Milan, MD   Chief Complaint  Patient presents with   New Patient (Initial Visit)    Patient is trandferring cardiac care from [facility] with last visit in 10/2021.  Patient denies new or acute cardiac problems/concerns today.      History of Present Illness:    Kendra Salas is a 72 y.o. adult with a hx of CAD/PCI x 9 (8 stents to LAD and  one to D2), hypertension, hyperlipidemia, CAD, angina presenting to establish care.  Previously seen by [facility] cardiology from a cardiac perspective.  Prior cardiologist moved.  Has a history of angina, well-controlled with Imdur.  Her spouse ambulates in the wheelchair.  She gets occasional chest discomfort when she overexerts herself such as pushing her spouses wheelchair up a ramp.  Denies any chest pain with ordinary activities.  Compliant with medications as prescribed including aspirin and Plavix.  Overall doing okay, no new concerns at this time.  Prior notes/studies Left heart cath 03/2021 s/p PCI of D2, prior LAD stents mild ISR. Lexiscan Myoview 10/2021 fixed apical defect consistent with prior infarct.  Past Medical History:  Diagnosis Date   Anxiety    Basal cell carcinoma 03/07/2008   left dorsum mid forearm   Basal cell carcinoma 04/19/2013   left distal lat ant thigh   Basal cell carcinoma 04/11/2014   right forearm, right post shoulder near deltoid   Basal cell carcinoma 10/12/2018   left mid back 7.0 cm lat to spine   CAD (coronary artery disease)    Clotting disorder (HCC)    Dysplastic nevus 02/02/2007   left volar mid to prox forearm   Dysplastic nevus 07/09/2010   right medial breast 1.5 cm med to areola, right med breast parasternal, right post distal deltoid   Eczema    GERD  (gastroesophageal reflux disease)    Hyperlipemia    Hypertension    Lymphoma (HCC) 2015   Melanoma in situ (HCC) 12/22/2006   right medial breast, med sup quad   Personal history of chemotherapy 2017   6 months of chemo    Past Surgical History:  Procedure Laterality Date   AUGMENTATION MAMMAPLASTY Bilateral 2003   CARDIAC CATHETERIZATION     X3 - has 9 stents. 4 LAD, 1 posterior, Most recent cath 03/2021   COLONOSCOPY  06/29/2012   repeat 10 yrs - Dr. Bluford Kaufmann   COLONOSCOPY WITH PROPOFOL N/A 08/07/2022   Procedure: COLONOSCOPY WITH PROPOFOL;  Surgeon: Midge Minium, MD;  Location: [facility];  Service: Endoscopy;  Laterality: N/A;  needs to be first case   ESOPHAGOGASTRODUODENOSCOPY (EGD) WITH PROPOFOL N/A 11/22/2015   Procedure: ESOPHAGOGASTRODUODENOSCOPY (EGD) WITH PROPOFOL;  Surgeon: Midge Minium, MD;  Location: [facility];  Service: Endoscopy;  Laterality: N/A;   FACIAL COSMETIC SURGERY     gender re-affirmation     HERNIA REPAIR     x2   POLYPECTOMY  08/07/2022   Procedure: POLYPECTOMY;  Surgeon: Midge Minium, MD;  Location: [facility];  Service: Endoscopy;;   PORTA CATH REMOVAL N/A 04/10/2019   Procedure: PORTA CATH REMOVAL;  Surgeon: Annice Needy, MD;  Location: [facility] INVASIVE CV LAB;  Service: Cardiovascular;  Laterality: N/A;    Current Medications: Current  Meds  Medication Sig   amLODipine (NORVASC) 5 MG tablet Take 1 tablet (5 mg total) by mouth daily.   aspirin 81 MG tablet Take 81 mg by mouth daily.   clopidogrel (PLAVIX) 75 MG tablet Take 1 tablet (75 mg total) by mouth daily. OFFICE VISIT NEEDED FOR ADDITIONAL REFILLS   dupilumab (DUPIXENT) 300 MG/2ML prefilled syringe INJECT 1 SYRINGE UNDER THE SKIN AS DIRECTED EVERY OTHER WEEK   estradiol (ESTRACE) 2 MG tablet TAKE 1 TABLET BY MOUTH DAILY.   finasteride (PROSCAR) 5 MG tablet TAKE 1 TABLET BY MOUTH DAILY.   hyoscyamine (ANASPAZ) 0.125 MG TBDP disintergrating tablet Place 1 tablet (0.125 mg  total) under the tongue every 6 (six) hours as needed.   isosorbide mononitrate (IMDUR) 30 MG 24 hr tablet Take 1 tablet (30 mg total) by mouth daily.   meclizine (ANTIVERT) 25 MG tablet Take 1 tablet (25 mg total) by mouth 3 (three) times daily as needed for dizziness.   mupirocin ointment (BACTROBAN) 2 % Apply 1 Application topically 2 (two) times daily.   nystatin cream (MYCOSTATIN) Apply to the affected area(s) 2 (two) times daily.   ondansetron (ZOFRAN) 4 MG tablet Take 1 tablet (4 mg total) by mouth every 8 (eight) hours as needed for nausea or vomiting.   pantoprazole (PROTONIX) 40 MG tablet TAKE 1 TABLET (40 MG TOTAL) BY MOUTH DAILY.   rosuvastatin (CRESTOR) 40 MG tablet Take 1 tablet (40 mg total) by mouth daily.   Ruxolitinib Phosphate (OPZELURA) 1.5 % CREA Apply 1 Application topically as directed. Qd to aa eczema   sulfacetamide (BLEPH-10) 10 % ophthalmic solution Place 1 drop into both eyes every 3 (three) hours.   triamcinolone cream (KENALOG) 0.1 % Apply 1 application topically 2 (two) times daily. To rash on legs   valsartan (DIOVAN) 160 MG tablet Take 1 tablet (160 mg total) by mouth once daily     Allergies:   Patient has no known allergies.   Social History   Socioeconomic History   Marital status: Married    Spouse name: Not on file   Number of children: Not on file   Years of education: Not on file   Highest education level: Not on file  Occupational History   Not on file  Tobacco Use   Smoking status: Never   Smokeless tobacco: Never  Vaping Use   Vaping Use: Never used  Substance and Sexual Activity   Alcohol use: Yes    Alcohol/week: 2.0 standard drinks of alcohol    Types: 2 Shots of liquor per week    Comment: 1 Margarita - Wed and Sat   Drug use: Never   Sexual activity: Not on file  Other Topics Concern   Not on file  Social History Narrative   Not on file   Social Determinants of Health   Financial Resource Strain: Low Risk  (04/10/2019)    Overall Financial Resource Strain (CARDIA)    Difficulty of Paying Living Expenses: Not hard at all  Food Insecurity: No Food Insecurity (02/03/2022)   Hunger Vital Sign    Worried About Running Out of Food in the Last Year: Never true    Ran Out of Food in the Last Year: Never true  Transportation Needs: No Transportation Needs (02/03/2022)   PRAPARE - Administrator, Civil Service (Medical): No    Lack of Transportation (Non-Medical): No  Physical Activity: Insufficiently Active (02/03/2022)   Exercise Vital Sign    Days of Exercise  per Week: 2 days    Minutes of Exercise per Session: 60 min  Stress: Stress Concern Present (02/03/2022)   [employer] of Occupational Health - Occupational Stress Questionnaire    Feeling of Stress : Very much  Social Connections: Moderately Integrated (02/03/2022)   Social Connection and Isolation Panel [NHANES]    Frequency of Communication with Friends and Family: More than three times a week    Frequency of Social Gatherings with Friends and Family: More than three times a week    Attends Religious Services: 1 to 4 times per year    Active Member of [employer] or Organizations: No    Attends Banker Meetings: Never    Marital Status: Married     Family History: The patient's family history includes Breast cancer (age of onset: 58) in her maternal aunt; Heart attack in her mother.  ROS:   Please see the history of present illness.     All other systems reviewed and are negative.  EKGs/Labs/Other Studies Reviewed:    The following studies were reviewed today:  EKG Interpretation Date/Time:  Wednesday April 14 2023 08:30:07 EDT Ventricular Rate:  76 PR Interval:  162 QRS Duration:  92 QT Interval:  440 QTC Calculation: 495 R Axis:   34  Text Interpretation: Sinus rhythm with occasional Premature ventricular complexes Low voltage QRS Confirmed by Debbe Odea ([id number]) on 04/14/2023 8:33:24 AM    Recent  Labs: 12/22/2022: ALT 16; BUN 12; Creatinine 0.8; Hemoglobin 13.0; Platelets 251; Potassium 3.7; Sodium 140; TSH 1.68  Recent Lipid Panel    Component Value Date/Time   CHOL 116 12/22/2022 0000   CHOL 125 05/04/2019 0803   TRIG 92 12/22/2022 0000   HDL 47 12/22/2022 0000   HDL 39 (L) 05/04/2019 0803   CHOLHDL 3.2 05/04/2019 0803   LDLCALC 51 12/22/2022 0000   LDLCALC 66 05/04/2019 0803     Risk Assessment/Calculations:             Physical Exam:    VS:  BP 120/87 (BP Location: Left Arm, Patient Position: Sitting, Cuff Size: Normal)   Pulse 76   Ht 5\' 11"  (1.803 m)   Wt 192 lb 12.8 oz (87.5 kg)   SpO2 97%   BMI 26.89 kg/m     Wt Readings from Last 3 Encounters:  04/14/23 192 lb 12.8 oz (87.5 kg)  01/12/23 192 lb 6.4 oz (87.3 kg)  08/07/22 187 lb (84.8 kg)     GEN:  Well nourished, well developed in no acute distress HEENT: Normal NECK: No JVD; No carotid bruits CARDIAC: RRR, no murmurs, rubs, gallops RESPIRATORY:  Clear to auscultation without rales, wheezing or rhonchi  ABDOMEN: Soft, non-tender, non-distended MUSCULOSKELETAL:  No edema; No deformity  SKIN: Warm and dry NEUROLOGIC:  Alert and oriented x 3 PSYCHIATRIC:  Normal affect   ASSESSMENT:    1. Coronary artery disease of native artery of native heart with stable angina pectoris (HCC)   2. Primary hypertension    PLAN:    In order of problems listed above:  CAD s/p PCI x 9.  Stable angina well controlled with nitrates.  Continue aspirin, Plavix, Imdur 30 mg daily, Crestor 40 mg daily.  LDL at goal.  Obtain echocardiogram. Hypertension, BP controlled, continue valsartan 160 mg daily, Norvasc 5 mg daily.  Follow-up in 2 to 3 months after echo.      Medication Adjustments/Labs and Tests Ordered: Current medicines are reviewed at length with the patient today.  Concerns regarding medicines are outlined above.  Orders Placed This Encounter  Procedures   EKG 12-Lead   ECHOCARDIOGRAM COMPLETE   No  orders of the defined types were placed in this encounter.   Patient Instructions  Medication Instructions:   Your physician recommends that you continue on your current medications as directed. Please refer to the Current Medication list given to you today.  *If you need a refill on your cardiac medications before your next appointment, please call your pharmacy*   Lab Work:  None Ordered  If you have labs (blood work) drawn today and your tests are completely normal, you will receive your results only by: MyChart Message (if you have MyChart) OR A paper copy in the mail If you have any lab test that is abnormal or we need to change your treatment, we will call you to review the results.   Testing/Procedures:  Your physician has requested that you have an echocardiogram. Echocardiography is a painless test that uses sound waves to create images of your heart. It provides your doctor with information about the size and shape of your heart and how well your heart's chambers and valves are working. This procedure takes approximately one hour. There are no restrictions for this procedure. Please do NOT wear cologne, perfume, aftershave, or lotions (deodorant is allowed). Please arrive 15 minutes prior to your appointment time.    Follow-Up: At [facility], you and your health needs are our priority.  As part of our continuing mission to provide you with exceptional heart care, we have created designated Provider Care Teams.  These Care Teams include your primary Cardiologist (physician) and Advanced Practice Providers (APPs -  Physician Assistants and Nurse Practitioners) who all work together to provide you with the care you need, when you need it.  We recommend signing up for the patient portal called "MyChart".  Sign up information is provided on this After Visit Summary.  MyChart is used to connect with patients for Virtual Visits (Telemedicine).  Patients are able to view  lab/test results, encounter notes, upcoming appointments, etc.  Non-urgent messages can be sent to your provider as well.   To learn more about what you can do with MyChart, go to [email or web address].    Your next appointment:   2 - 3 month(s)  Provider:   You may see Debbe Odea, MD or one of the following Advanced Practice Providers on your designated Care Team:   Nicolasa Ducking, NP Eula Listen, PA-C Cadence Fransico Michael, PA-C Charlsie Quest, NP    Signed, Debbe Odea, MD  04/14/2023 9:48 AM    [facility]

## 2023-04-14 NOTE — Patient Instructions (Signed)
Medication Instructions:   Your physician recommends that you continue on your current medications as directed. Please refer to the Current Medication list given to you today.  *If you need a refill on your cardiac medications before your next appointment, please call your pharmacy*   Lab Work:  None Ordered  If you have labs (blood work) drawn today and your tests are completely normal, you will receive your results only by: MyChart Message (if you have MyChart) OR A paper copy in the mail If you have any lab test that is abnormal or we need to change your treatment, we will call you to review the results.   Testing/Procedures:  Your physician has requested that you have an echocardiogram. Echocardiography is a painless test that uses sound waves to create images of your heart. It provides your doctor with information about the size and shape of your heart and how well your heart's chambers and valves are working. This procedure takes approximately one hour. There are no restrictions for this procedure. Please do NOT wear cologne, perfume, aftershave, or lotions (deodorant is allowed). Please arrive 15 minutes prior to your appointment time.    Follow-Up: At [facility], you and your health needs are our priority.  As part of our continuing mission to provide you with exceptional heart care, we have created designated Provider Care Teams.  These Care Teams include your primary Cardiologist (physician) and Advanced Practice Providers (APPs -  Physician Assistants and Nurse Practitioners) who all work together to provide you with the care you need, when you need it.  We recommend signing up for the patient portal called "MyChart".  Sign up information is provided on this After Visit Summary.  MyChart is used to connect with patients for Virtual Visits (Telemedicine).  Patients are able to view lab/test results, encounter notes, upcoming appointments, etc.  Non-urgent messages can  be sent to your provider as well.   To learn more about what you can do with MyChart, go to [email or web address].    Your next appointment:   2 - 3 month(s)  Provider:   You may see Brian Agbor-Etang, MD or one of the following Advanced Practice Providers on your designated Care Team:   Christopher Berge, NP Ryan Dunn, PA-C Cadence Furth, PA-C Sheri Hammock, NP  

## 2023-04-15 ENCOUNTER — Other Ambulatory Visit (HOSPITAL_COMMUNITY): Payer: Self-pay

## 2023-04-15 ENCOUNTER — Other Ambulatory Visit: Payer: Self-pay

## 2023-04-22 ENCOUNTER — Other Ambulatory Visit: Payer: Self-pay

## 2023-04-22 ENCOUNTER — Other Ambulatory Visit: Payer: Self-pay | Admitting: Internal Medicine

## 2023-04-22 DIAGNOSIS — I10 Essential (primary) hypertension: Secondary | ICD-10-CM

## 2023-04-22 DIAGNOSIS — I25118 Atherosclerotic heart disease of native coronary artery with other forms of angina pectoris: Secondary | ICD-10-CM

## 2023-04-22 DIAGNOSIS — Z79890 Hormone replacement therapy (postmenopausal): Secondary | ICD-10-CM

## 2023-04-22 DIAGNOSIS — K219 Gastro-esophageal reflux disease without esophagitis: Secondary | ICD-10-CM

## 2023-04-22 MED ORDER — FINASTERIDE 5 MG PO TABS
5.0000 mg | ORAL_TABLET | Freq: Every day | ORAL | 1 refills | Status: DC
Start: 2023-04-22 — End: 2023-11-04
  Filled 2023-04-22: qty 90, 90d supply, fill #0
  Filled 2023-07-23: qty 90, 90d supply, fill #1

## 2023-04-22 MED ORDER — CLOPIDOGREL BISULFATE 75 MG PO TABS
75.0000 mg | ORAL_TABLET | Freq: Every day | ORAL | 1 refills | Status: DC
Start: 2023-04-22 — End: 2023-12-07
  Filled 2023-04-22: qty 90, 90d supply, fill #0
  Filled 2023-07-23: qty 90, 90d supply, fill #1

## 2023-04-22 MED ORDER — VALSARTAN 160 MG PO TABS
160.0000 mg | ORAL_TABLET | Freq: Every day | ORAL | 3 refills | Status: AC
Start: 2023-04-22 — End: ?
  Filled 2023-04-22: qty 60, 60d supply, fill #0
  Filled 2023-04-22: qty 30, 30d supply, fill #0
  Filled 2023-07-23: qty 90, 90d supply, fill #1
  Filled 2023-10-21: qty 90, 90d supply, fill #2
  Filled 2024-02-14: qty 90, 90d supply, fill #3

## 2023-04-22 MED ORDER — ESTRADIOL 2 MG PO TABS
2.0000 mg | ORAL_TABLET | Freq: Every day | ORAL | 1 refills | Status: DC
Start: 2023-04-22 — End: 2023-10-21
  Filled 2023-04-22: qty 90, 90d supply, fill #0
  Filled 2023-07-23: qty 90, 90d supply, fill #1

## 2023-04-22 MED ORDER — PANTOPRAZOLE SODIUM 40 MG PO TBEC
40.0000 mg | DELAYED_RELEASE_TABLET | Freq: Every day | ORAL | 1 refills | Status: DC
Start: 2023-04-22 — End: 2023-10-21
  Filled 2023-04-22: qty 90, 90d supply, fill #0
  Filled 2023-07-23: qty 90, 90d supply, fill #1

## 2023-04-29 ENCOUNTER — Other Ambulatory Visit: Payer: Self-pay

## 2023-05-12 ENCOUNTER — Ambulatory Visit: Payer: Commercial Managed Care - PPO

## 2023-05-12 DIAGNOSIS — I25118 Atherosclerotic heart disease of native coronary artery with other forms of angina pectoris: Secondary | ICD-10-CM

## 2023-05-12 DIAGNOSIS — I351 Nonrheumatic aortic (valve) insufficiency: Secondary | ICD-10-CM

## 2023-05-12 DIAGNOSIS — I77810 Thoracic aortic ectasia: Secondary | ICD-10-CM | POA: Diagnosis not present

## 2023-05-12 DIAGNOSIS — I5030 Unspecified diastolic (congestive) heart failure: Secondary | ICD-10-CM | POA: Diagnosis not present

## 2023-05-12 LAB — ECHOCARDIOGRAM COMPLETE
AV Mean grad: 2.5 mmHg
AV Peak grad: 4.1 mmHg
Ao pk vel: 1.01 m/s
Area-P 1/2: 2.62 cm2
S' Lateral: 3.2 cm

## 2023-05-19 ENCOUNTER — Other Ambulatory Visit (HOSPITAL_COMMUNITY): Payer: Self-pay

## 2023-05-31 ENCOUNTER — Other Ambulatory Visit (HOSPITAL_COMMUNITY): Payer: Self-pay

## 2023-06-16 ENCOUNTER — Encounter: Payer: Self-pay | Admitting: Cardiology

## 2023-06-16 ENCOUNTER — Ambulatory Visit: Payer: Commercial Managed Care - PPO | Attending: Cardiology | Admitting: Cardiology

## 2023-06-16 ENCOUNTER — Ambulatory Visit: Payer: Commercial Managed Care - PPO | Admitting: Dermatology

## 2023-06-16 ENCOUNTER — Other Ambulatory Visit: Payer: Self-pay

## 2023-06-16 VITALS — BP 124/82 | HR 64 | Ht 71.0 in | Wt 195.0 lb

## 2023-06-16 DIAGNOSIS — Z85828 Personal history of other malignant neoplasm of skin: Secondary | ICD-10-CM

## 2023-06-16 DIAGNOSIS — D229 Melanocytic nevi, unspecified: Secondary | ICD-10-CM | POA: Diagnosis not present

## 2023-06-16 DIAGNOSIS — W908XXA Exposure to other nonionizing radiation, initial encounter: Secondary | ICD-10-CM

## 2023-06-16 DIAGNOSIS — L814 Other melanin hyperpigmentation: Secondary | ICD-10-CM

## 2023-06-16 DIAGNOSIS — C8590 Non-Hodgkin lymphoma, unspecified, unspecified site: Secondary | ICD-10-CM

## 2023-06-16 DIAGNOSIS — I10 Essential (primary) hypertension: Secondary | ICD-10-CM

## 2023-06-16 DIAGNOSIS — Z79899 Other long term (current) drug therapy: Secondary | ICD-10-CM

## 2023-06-16 DIAGNOSIS — R0609 Other forms of dyspnea: Secondary | ICD-10-CM

## 2023-06-16 DIAGNOSIS — L821 Other seborrheic keratosis: Secondary | ICD-10-CM

## 2023-06-16 DIAGNOSIS — L578 Other skin changes due to chronic exposure to nonionizing radiation: Secondary | ICD-10-CM | POA: Diagnosis not present

## 2023-06-16 DIAGNOSIS — L2089 Other atopic dermatitis: Secondary | ICD-10-CM

## 2023-06-16 DIAGNOSIS — Z1283 Encounter for screening for malignant neoplasm of skin: Secondary | ICD-10-CM | POA: Diagnosis not present

## 2023-06-16 DIAGNOSIS — D492 Neoplasm of unspecified behavior of bone, soft tissue, and skin: Secondary | ICD-10-CM

## 2023-06-16 DIAGNOSIS — I25118 Atherosclerotic heart disease of native coronary artery with other forms of angina pectoris: Secondary | ICD-10-CM | POA: Diagnosis not present

## 2023-06-16 DIAGNOSIS — Z86006 Personal history of melanoma in-situ: Secondary | ICD-10-CM

## 2023-06-16 DIAGNOSIS — Z86018 Personal history of other benign neoplasm: Secondary | ICD-10-CM

## 2023-06-16 DIAGNOSIS — Z7189 Other specified counseling: Secondary | ICD-10-CM

## 2023-06-16 DIAGNOSIS — Z85820 Personal history of malignant melanoma of skin: Secondary | ICD-10-CM

## 2023-06-16 DIAGNOSIS — C44319 Basal cell carcinoma of skin of other parts of face: Secondary | ICD-10-CM | POA: Diagnosis not present

## 2023-06-16 DIAGNOSIS — L2081 Atopic neurodermatitis: Secondary | ICD-10-CM

## 2023-06-16 MED ORDER — METOPROLOL SUCCINATE ER 25 MG PO TB24
25.0000 mg | ORAL_TABLET | Freq: Every day | ORAL | 3 refills | Status: DC
  Filled 2023-06-16: qty 90, 90d supply, fill #0
  Filled 2023-09-09 – 2023-09-20 (×2): qty 90, 90d supply, fill #1
  Filled 2023-10-10 – 2023-12-20 (×2): qty 90, 90d supply, fill #2

## 2023-06-16 NOTE — Progress Notes (Signed)
Cardiology Office Note:    Date:  06/16/2023   ID:  Kendra Salas, DOB 02/05/1951, MRN [id number]  PCP:  Reubin Milan, MD   [facility] Providers Cardiologist:  Debbe Odea, MD     Referring MD: Reubin Milan, MD   Chief Complaint  Patient presents with   Follow-up    Discuss cardiac testing results.  Dyspnea on exertion not improving.      History of Present Illness:    Kendra Salas is a 72 y.o. adult with a hx of CAD/PCI x 9 (8 stents to LAD and  one to D2), hypertension, hyperlipidemia, who presents for follow-up.  Previously seen to establish care, endorses shortness of breath with exertion over the past 6 months.  Finds it difficult to walk up a hill or long distances while shopping.  Denies any chest pains today on the past several weeks.  Stress test about a year ago showed a fixed apical defect.  Left heart cath about 2 years ago showed patent LAD stents.  Prior notes/studies Left heart cath 03/2021 s/p PCI of D2, prior LAD stents mild ISR. Lexiscan Myoview 10/2021 fixed apical defect consistent with prior infarct.  Past Medical History:  Diagnosis Date   Anxiety    Basal cell carcinoma 03/07/2008   left dorsum mid forearm   Basal cell carcinoma 04/19/2013   left distal lat ant thigh   Basal cell carcinoma 04/11/2014   right forearm, right post shoulder near deltoid   Basal cell carcinoma 10/12/2018   left mid back 7.0 cm lat to spine   CAD (coronary artery disease)    Clotting disorder (HCC)    Dysplastic nevus 02/02/2007   left volar mid to prox forearm   Dysplastic nevus 07/09/2010   right medial breast 1.5 cm med to areola, right med breast parasternal, right post distal deltoid   Eczema    GERD (gastroesophageal reflux disease)    Hyperlipemia    Hypertension    Lymphoma (HCC) 2015   Melanoma in situ (HCC) 12/22/2006   right medial breast, med sup quad   Personal history of chemotherapy 2017   6 months of chemo    Past  Surgical History:  Procedure Laterality Date   AUGMENTATION MAMMAPLASTY Bilateral 2003   CARDIAC CATHETERIZATION     X3 - has 9 stents. 4 LAD, 1 posterior, Most recent cath 03/2021   COLONOSCOPY  06/29/2012   repeat 10 yrs - Dr. Bluford Kaufmann   COLONOSCOPY WITH PROPOFOL N/A 08/07/2022   Procedure: COLONOSCOPY WITH PROPOFOL;  Surgeon: Midge Minium, MD;  Location[facility];  Service: Endoscopy;  Laterality: N/A;  needs to be first case   ESOPHAGOGASTRODUODENOSCOPY (EGD) WITH PROPOFOL N/A 11/22/2015   Procedure: ESOPHAGOGASTRODUODENOSCOPY (EGD) WITH PROPOFOL;  Surgeon: Midge Minium, MD;  Location: [facility];  Service: Endoscopy;  Laterality: N/A;   FACIAL COSMETIC SURGERY     gender re-affirmation     HERNIA REPAIR     x2   POLYPECTOMY  08/07/2022   Procedure: POLYPECTOMY;  Surgeon: Midge Minium, MD;  Location: [facility];  Service: Endoscopy;;   PORTA CATH REMOVAL N/A 04/10/2019   Procedure: PORTA CATH REMOVAL;  Surgeon: Annice Needy, MD;  Location: [facility];  Service: Cardiovascular;  Laterality: N/A;    Current Medications: Current Meds  Medication Sig   aspirin 81 MG tablet Take 81 mg by mouth daily.   clopidogrel (PLAVIX) 75 MG tablet Take 1 tablet (75 mg  total) by mouth daily. OFFICE VISIT NEEDED FOR ADDITIONAL REFILLS   dupilumab (DUPIXENT) 300 MG/2ML prefilled syringe INJECT 1 SYRINGE UNDER THE SKIN AS DIRECTED EVERY OTHER WEEK   estradiol (ESTRACE) 2 MG tablet Take 1 tablet (2 mg total) by mouth daily.   finasteride (PROSCAR) 5 MG tablet Take 1 tablet (5 mg total) by mouth daily.   hyoscyamine (ANASPAZ) 0.125 MG TBDP disintergrating tablet Place 1 tablet (0.125 mg total) under the tongue every 6 (six) hours as needed.   isosorbide mononitrate (IMDUR) 30 MG 24 hr tablet Take 1 tablet (30 mg total) by mouth daily.   meclizine (ANTIVERT) 25 MG tablet Take 1 tablet (25 mg total) by mouth 3 (three) times daily as needed for dizziness.   metoprolol succinate  (TOPROL XL) 25 MG 24 hr tablet Take 1 tablet (25 mg total) by mouth daily.   mupirocin ointment (BACTROBAN) 2 % Apply 1 Application topically 2 (two) times daily.   nystatin cream (MYCOSTATIN) Apply to the affected area(s) 2 (two) times daily.   ondansetron (ZOFRAN) 4 MG tablet Take 1 tablet (4 mg total) by mouth every 8 (eight) hours as needed for nausea or vomiting.   pantoprazole (PROTONIX) 40 MG tablet Take 1 tablet (40 mg total) by mouth daily.   rosuvastatin (CRESTOR) 40 MG tablet Take 1 tablet (40 mg total) by mouth daily.   Ruxolitinib Phosphate (OPZELURA) 1.5 % CREA Apply 1 Application topically as directed. Qd to aa eczema   sulfacetamide (BLEPH-10) 10 % ophthalmic solution Place 1 drop into both eyes every 3 (three) hours.   triamcinolone cream (KENALOG) 0.1 % Apply 1 application topically 2 (two) times daily. To rash on legs   valsartan (DIOVAN) 160 MG tablet Take 1 tablet (160 mg total) by mouth daily.   [DISCONTINUED] amLODipine (NORVASC) 5 MG tablet Take 1 tablet (5 mg total) by mouth daily.     Allergies:   Patient has no known allergies.   Social History   Socioeconomic History   Marital status: Married    Spouse name: Not on file   Number of children: Not on file   Years of education: Not on file   Highest education level: Not on file  Occupational History   Not on file  Tobacco Use   Smoking status: Never   Smokeless tobacco: Never  Vaping Use   Vaping status: Never Used  Substance and Sexual Activity   Alcohol use: Yes    Alcohol/week: 2.0 standard drinks of alcohol    Types: 2 Shots of liquor per week    Comment: 1 Margarita - Wed and Sat   Drug use: Never   Sexual activity: Not on file  Other Topics Concern   Not on file  Social History Narrative   Not on file   Social Determinants of Health   Financial Resource Strain: Low Risk  (04/10/2019)   Overall Financial Resource Strain (CARDIA)    Difficulty of Paying Living Expenses: Not hard at all  Food  Insecurity: No Food Insecurity (02/03/2022)   Hunger Vital Sign    Worried About Running Out of Food in the Last Year: Never true    Ran Out of Food in the Last Year: Never true  Transportation Needs: No Transportation Needs (02/03/2022)   PRAPARE - Administrator, Civil Service (Medical): No    Lack of Transportation (Non-Medical): No  Physical Activity: Insufficiently Active (02/03/2022)   Exercise Vital Sign    Days of Exercise per Week:  2 days    Minutes of Exercise per Session: 60 min  Stress: Stress Concern Present (02/03/2022)   [employer] of Occupational Health - Occupational Stress Questionnaire    Feeling of Stress : Very much  Social Connections: Moderately Integrated (02/03/2022)   Social Connection and Isolation Panel [NHANES]    Frequency of Communication with Friends and Family: More than three times a week    Frequency of Social Gatherings with Friends and Family: More than three times a week    Attends Religious Services: 1 to 4 times per year    Active Member of [employer] or Organizations: No    Attends Banker Meetings: Never    Marital Status: Married     Family History: The patient's family history includes Breast cancer (age of onset: 91) in her maternal aunt; Heart attack in her mother.  ROS:   Please see the history of present illness.     All other systems reviewed and are negative.  EKGs/Labs/Other Studies Reviewed:    The following studies were reviewed today:       Recent Labs: 12/22/2022: ALT 16; BUN 12; Creatinine 0.8; Hemoglobin 13.0; Platelets 251; Potassium 3.7; Sodium 140; TSH 1.68  Recent Lipid Panel    Component Value Date/Time   CHOL 116 12/22/2022 0000   CHOL 125 05/04/2019 0803   TRIG 92 12/22/2022 0000   HDL 47 12/22/2022 0000   HDL 39 (L) 05/04/2019 0803   CHOLHDL 3.2 05/04/2019 0803   LDLCALC 51 12/22/2022 0000   LDLCALC 66 05/04/2019 0803     Risk Assessment/Calculations:             Physical Exam:     VS:  BP 124/82 (BP Location: Left Arm, Patient Position: Sitting, Cuff Size: Normal)   Pulse 64   Ht 5\' 11"  (1.803 m)   Wt 195 lb (88.5 kg)   SpO2 97%   BMI 27.20 kg/m     Wt Readings from Last 3 Encounters:  06/16/23 195 lb (88.5 kg)  04/14/23 192 lb 12.8 oz (87.5 kg)  01/12/23 192 lb 6.4 oz (87.3 kg)     GEN:  Well nourished, well developed in no acute distress HEENT: Normal NECK: No JVD; No carotid bruits CARDIAC: RRR, no murmurs, rubs, gallops RESPIRATORY:  Clear to auscultation without rales, wheezing or rhonchi  ABDOMEN: Soft, non-tender, non-distended MUSCULOSKELETAL:  No edema; No deformity  SKIN: Warm and dry NEUROLOGIC:  Alert and oriented x 3 PSYCHIATRIC:  Normal affect   ASSESSMENT:    1. Dyspnea on exertion   2. Coronary artery disease of native artery of native heart with stable angina pectoris (HCC)   3. Primary hypertension     PLAN:    In order of problems listed above:  Dyspnea on exertion, could be an anginal equivalent.  Last stress test showed fixed apical defects.  Patient is high risk.  Obtain right and left heart cath. CAD s/p PCI x 9.  History of stable angina, currently with dyspnea on exertion as above.  Start Toprol-XL 25 mg daily, continue Imdur 30 mg daily, aspirin, Plavix, Crestor 40 mg daily.  LDL at goal..  EF 50-55%. Hypertension, BP controlled, continue valsartan 160 mg daily, start Toprol-XL 25 mg daily for antianginal benefit.  Stop Norvasc.  Follow-up in 6 weeks  Informed Consent   Shared Decision Making/Informed Consent The risks [stroke (1 in 1000), death (1 in 1000), kidney failure [usually temporary] (1 in 500), bleeding (1 in 200), allergic  reaction [possibly serious] (1 in 200)], benefits (diagnostic support and management of coronary artery disease) and alternatives of a cardiac catheterization were discussed in detail with Kendra Salas and she is willing to proceed.          Medication Adjustments/Labs and Tests  Ordered: Current medicines are reviewed at length with the patient today.  Concerns regarding medicines are outlined above.  Orders Placed This Encounter  Procedures   Basic Metabolic Panel (BMET)   CBC   Meds ordered this encounter  Medications   metoprolol succinate (TOPROL XL) 25 MG 24 hr tablet    Sig: Take 1 tablet (25 mg total) by mouth daily.    Dispense:  90 tablet    Refill:  3    Patient Instructions  Medication Instructions:   STOP amlodipine  START Toprol XL - Take one tablet (25mg ) by mouth daily.   *If you need a refill on your cardiac medications before your next appointment, please call your pharmacy*   Lab Work:  1,. Your provider would like for you to have labs today BMP / CBC  If you have labs (blood work) drawn today and your tests are completely normal, you will receive your results only by: MyChart Message (if you have MyChart) OR A paper copy in the mail If you have any lab test that is abnormal or we need to change your treatment, we will call you to review the results.   Testing/Procedures:   [place] [facility] [place] [address] [place], [address] [place] [address] Dept: [phone] Loc: [phone]  Kendra Salas  06/16/2023  You are scheduled for a Cardiac Catheterization on Thursday, September 12 with Dr. Lorine Bears.  1. Please arrive at the [facility] Entrance of [facility], [address], [place] [address] at 11:00 AM (This is 1 hour(s) prior to your procedure time).  Proceed to the Check-In Desk directly inside the entrance.  Procedure Parking: Use the entrance off of the [address] side of the hospital. Turn right upon entering and follow the driveway to parking that is directly in front of the Heart & Vascular Center. There is no valet parking available at this entrance, however there is an awning directly in front of the Heart  & Vascular Center for drop off/ pick up for patients.  Special note: Every effort is made to have your procedure done on time. Please understand that emergencies sometimes delay scheduled procedures.  2. Diet: Do not eat solid foods after midnight.  The patient may have clear liquids until 5am upon the day of the procedure.  3. Labs: You will need to have blood drawn TODAY  4. Medication instructions in preparation for your procedure:   Contrast Allergy: No  On the morning of your procedure, take your Plavix/Clopidogrel and any morning medicines NOT listed above.  You may use sips of water.  5. Plan to go home the same day, you will only stay overnight if medically necessary. 6. Bring a current list of your medications and current insurance cards. 7. You MUST have a responsible person to drive you home. 8. Someone MUST be with you the first 24 hours after you arrive home or your discharge will be delayed. 9. Please wear clothes that are easy to get on and off and wear slip-on shoes.  Thank you for allowing [place] to care for you!   -- [facility]  Invasive Cardiovascular services    Follow-Up: At [facility], you and your health needs are our priority.  As part of our continuing mission to provide you with exceptional heart care, we have created designated Provider Care Teams.  These Care Teams include your primary Cardiologist (physician) and Advanced Practice Providers (APPs -  Physician Assistants and Nurse Practitioners) who all work together to provide you with the care you need, when you need it.  We recommend signing up for the patient portal called "MyChart".  Sign up information is provided on this After Visit Summary.  MyChart is used to connect with patients for Virtual Visits (Telemedicine).  Patients are able to view lab/test results, encounter notes, upcoming appointments, etc.  Non-urgent messages can be sent to your provider as well.   To learn more about what you can  do with MyChart, go to [email or web address].    Your next appointment:   6 week(s)  Provider:   You may see Debbe Odea, MD or one of the following Advanced Practice Providers on your designated Care Team:   Nicolasa Ducking, NP Eula Listen, PA-C Cadence Fransico Michael, PA-C Charlsie Quest, NP   Signed, Debbe Odea, MD  06/16/2023 10:02 AM    [facility]

## 2023-06-16 NOTE — Patient Instructions (Addendum)

## 2023-06-16 NOTE — Patient Instructions (Signed)
Medication Instructions:   STOP amlodipine  START Toprol XL - Take one tablet (25mg ) by mouth daily.   *If you need a refill on your cardiac medications before your next appointment, please call your pharmacy*   Lab Work:  1,. Your provider would like for you to have labs today BMP / CBC  If you have labs (blood work) drawn today and your tests are completely normal, you will receive your results only by: MyChart Message (if you have MyChart) OR A paper copy in the mail If you have any lab test that is abnormal or we need to change your treatment, we will call you to review the results.   Testing/Procedures:   [place] [facility] [place] [address], [address] [place] [address] Dept: [phone] Loc: [phone]  MILIYAH MCCREA  06/16/2023  You are scheduled for a Cardiac Catheterization on Thursday, September 12 with Dr. Lorine Bears.  1. Please arrive at the [facility] Entrance of [facility], [address], [place] [address] at 11:00 AM (This is 1 hour(s) prior to your procedure time).  Proceed to the Check-In Desk directly inside the entrance.  Procedure Parking: Use the entrance off of the [address] side of the hospital. Turn right upon entering and follow the driveway to parking that is directly in front of the Heart & Vascular Center. There is no valet parking available at this entrance, however there is an awning directly in front of the Heart & Vascular Center for drop off/ pick up for patients.  Special note: Every effort is made to have your procedure done on time. Please understand that emergencies sometimes delay scheduled procedures.  2. Diet: Do not eat solid foods after midnight.  The patient may have clear liquids until 5am upon the day of the procedure.  3. Labs: You will need to have blood drawn TODAY  4. Medication instructions in preparation for  your procedure:   Contrast Allergy: No  On the morning of your procedure, take your Plavix/Clopidogrel and any morning medicines NOT listed above.  You may use sips of water.  5. Plan to go home the same day, you will only stay overnight if medically necessary. 6. Bring a current list of your medications and current insurance cards. 7. You MUST have a responsible person to drive you home. 8. Someone MUST be with you the first 24 hours after you arrive home or your discharge will be delayed. 9. Please wear clothes that are easy to get on and off and wear slip-on shoes.  Thank you for allowing [place] to care for you!   -- [place] Invasive Cardiovascular services    Follow-Up: At [facility], you and your health needs are our priority.  As part of our continuing mission to provide you with exceptional heart care, we have created designated Provider Care Teams.  These Care Teams include your primary Cardiologist (physician) and Advanced Practice Providers (APPs -  Physician Assistants and Nurse Practitioners) who all work together to provide you with the care you need, when you need it.  We recommend signing up for the patient portal called "MyChart".  Sign up information is provided on this After Visit Summary.  MyChart is used to connect with patients for Virtual Visits (Telemedicine).  Patients are able to view lab/test results, encounter notes, upcoming appointments, etc.  Non-urgent messages can be sent to your provider  as well.   To learn more about what you can do with MyChart, go to [email or web address].    Your next appointment:   6 week(s)  Provider:   You may see Debbe Odea, MD or one of the following Advanced Practice Providers on your designated Care Team:   Nicolasa Ducking, NP Eula Listen, PA-C Cadence Fransico Michael, PA-C Charlsie Quest, NP

## 2023-06-16 NOTE — Progress Notes (Unsigned)
Follow-Up Visit   Subjective  Kendra Salas is a 72 y.o. adult who presents for the following: Skin Cancer Screening and Full Body Skin Exam, hx of Melanoma in situ, hx of Dysplastic nevus, hx of BCC.  Atopic dermatitis treating with Dupixent injections every 2 weeks with a good response.   The patient presents for Total-Body Skin Exam (TBSE) for skin cancer screening and mole check. The patient has spots, moles and lesions to be evaluated, some may be new or changing and the patient may have concern these could be cancer.    The following portions of the chart were reviewed this encounter and updated as appropriate: medications, allergies, medical history  Review of Systems:  No other skin or systemic complaints except as noted in HPI or Assessment and Plan.  Objective  Well appearing patient in no apparent distress; mood and affect are within normal limits.  A full examination was performed including scalp, head, eyes, ears, nose, lips, neck, chest, axillae, abdomen, back, buttocks, bilateral upper extremities, bilateral lower extremities, hands, feet, fingers, toes, fingernails, and toenails. All findings within normal limits unless otherwise noted below.   Relevant physical exam findings are noted in the Assessment and Plan.  right forehead 1.3 cm crusty papule        Assessment & Plan   Atopic neurodermatitis trunk, extremities- clear skin at buttocks    Atopic dermatitis - Severe, on Dupixent (biologic medication).  Atopic dermatitis (eczema) is a chronic, relapsing, pruritic condition that can significantly affect quality of life. It is often associated with allergic rhinitis and/or asthma and can require treatment with topical medications, phototherapy, or in severe cases biologic medications, which require long term medication management.     Cont Dupixent 300mg /67ml sq injections q 2 wks Cont Opzelura cr qd prn flares   Dupilumab (Dupixent) is a treatment given  by injection for adults and children with moderate-to-severe atopic dermatitis. Goal is control of skin condition, not cure. It is given as 2 injections at the first dose followed by 1 injection ever 2 weeks thereafter.  Young children are dosed monthly.   Potential side effects include allergic reaction, herpes infections, injection site reactions and conjunctivitis (inflammation of the eyes).  The use of Dupixent requires long term medication management, including periodic office visits.    SKIN CANCER SCREENING PERFORMED TODAY.  ACTINIC DAMAGE - Chronic condition, secondary to cumulative UV/sun exposure - diffuse scaly erythematous macules with underlying dyspigmentation - Recommend daily broad spectrum sunscreen SPF 30+ to sun-exposed areas, reapply every 2 hours as needed.  - Staying in the shade or wearing long sleeves, sun glasses (UVA+UVB protection) and wide brim hats (4-inch brim around the entire circumference of the hat) are also recommended for sun protection.  - Call for new or changing lesions.  LENTIGINES, SEBORRHEIC KERATOSES, HEMANGIOMAS - Benign normal skin lesions - Benign-appearing - Call for any changes  MELANOCYTIC NEVI - Tan-brown and/or pink-flesh-colored symmetric macules and papules - Benign appearing on exam today - Observation - Call clinic for new or changing moles - Recommend daily use of broad spectrum spf 30+ sunscreen to sun-exposed areas.   History of Dysplastic Nevi Multiple see history  - No evidence of recurrence today - Recommend regular full body skin exams - Recommend daily broad spectrum sunscreen SPF 30+ to sun-exposed areas, reapply every 2 hours as needed.  - Call if any new or changing lesions are noted between office visits    History of Basal Cell Carcinoma of  the Skin Multiple see history  - No evidence of recurrence today - Recommend regular full body skin exams - Recommend daily broad spectrum sunscreen SPF 30+ to sun-exposed  areas, reapply every 2 hours as needed.  - Call if any new or changing lesions are noted between office visits    History of Melanoma in Situ Right medial breast 2008 - No evidence of recurrence today - Recommend regular full body skin exams - Recommend daily broad spectrum sunscreen SPF 30+ to sun-exposed areas, reapply every 2 hours as needed.  - Call if any new or changing lesions are noted between office visits    Non Non-Hodgkin lymphoma in remission  Neoplasm of skin right forehead  Epidermal / dermal shaving  Lesion diameter (cm):  0.8 Informed consent: discussed and consent obtained   Timeout: patient name, date of birth, surgical site, and procedure verified   Procedure prep:  Patient was prepped and draped in usual sterile fashion Prep type:  Isopropyl alcohol Anesthesia: the lesion was anesthetized in a standard fashion   Anesthetic:  1% lidocaine w/ epinephrine 1-100,000 buffered w/ 8.4% NaHCO3 Hemostasis achieved with: pressure, aluminum chloride and electrodesiccation   Outcome: patient tolerated procedure well   Post-procedure details: sterile dressing applied and wound care instructions given   Dressing type: bandage and petrolatum    Destruction of lesion  Destruction method: electrodesiccation and curettage   Informed consent: discussed and consent obtained   Timeout:  patient name, date of birth, surgical site, and procedure verified Anesthesia: the lesion was anesthetized in a standard fashion   Anesthetic:  1% lidocaine w/ epinephrine 1-100,000 buffered w/ 8.4% NaHCO3 Curettage performed in three different directions: Yes   Electrodesiccation performed over the curetted area: Yes   Curettage cycles:  3 Lesion length (cm):  1.3 Lesion width (cm):  1.3 Margin per side (cm):  0.2 Final wound size (cm):  1.7 Hemostasis achieved with:  electrodesiccation Outcome: patient tolerated procedure well with no complications   Post-procedure details: sterile  dressing applied and wound care instructions given   Dressing type: petrolatum    Specimen 1 - Surgical pathology Differential Diagnosis: R/O BCC  Check Margins: No  Skin cancer screening  Actinic skin damage  Lentigo  Melanocytic nevus, unspecified location  History of malignant melanoma  History of dysplastic nevus  History of basal cell carcinoma  Medication management  Other atopic dermatitis  Counseling and coordination of care   Return in about 6 months (around 12/14/2023) for Dupixent and TBSE in 12 months hx of MMIS .  IAngelique Holm, CMA, am acting as scribe for Armida Sans, MD .   Documentation: I have reviewed the above documentation for accuracy and completeness, and I agree with the above.  Armida Sans, MD

## 2023-06-16 NOTE — H&P (View-Only) (Signed)
Cardiology Office Note:    Date:  06/16/2023   ID:  Kendra Salas, DOB Aug 29, 1951, MRN [id number]  PCP:  Reubin Milan, MD   [facility] Providers Cardiologist:  Debbe Odea, MD     Referring MD: Reubin Milan, MD   Chief Complaint  Patient presents with   Follow-up    Discuss cardiac testing results.  Dyspnea on exertion not improving.      History of Present Illness:    Kendra Salas is a 72 y.o. adult with a hx of CAD/PCI x 9 (8 stents to LAD and  one to D2), hypertension, hyperlipidemia, who presents for follow-up.  Previously seen to establish care, endorses shortness of breath with exertion over the past 6 months.  Finds it difficult to walk up a hill or long distances while shopping.  Denies any chest pains today on the past several weeks.  Stress test about a year ago showed a fixed apical defect.  Left heart cath about 2 years ago showed patent LAD stents.  Prior notes/studies Left heart cath 03/2021 s/p PCI of D2, prior LAD stents mild ISR. Lexiscan Myoview 10/2021 fixed apical defect consistent with prior infarct.  Past Medical History:  Diagnosis Date   Anxiety    Basal cell carcinoma 03/07/2008   left dorsum mid forearm   Basal cell carcinoma 04/19/2013   left distal lat ant thigh   Basal cell carcinoma 04/11/2014   right forearm, right post shoulder near deltoid   Basal cell carcinoma 10/12/2018   left mid back 7.0 cm lat to spine   CAD (coronary artery disease)    Clotting disorder (HCC)    Dysplastic nevus 02/02/2007   left volar mid to prox forearm   Dysplastic nevus 07/09/2010   right medial breast 1.5 cm med to areola, right med breast parasternal, right post distal deltoid   Eczema    GERD (gastroesophageal reflux disease)    Hyperlipemia    Hypertension    Lymphoma (HCC) 2015   Melanoma in situ (HCC) 12/22/2006   right medial breast, med sup quad   Personal history of chemotherapy 2017   6 months of chemo    Past  Surgical History:  Procedure Laterality Date   AUGMENTATION MAMMAPLASTY Bilateral 2003   CARDIAC CATHETERIZATION     X3 - has 9 stents. 4 LAD, 1 posterior, Most recent cath 03/2021   COLONOSCOPY  06/29/2012   repeat 10 yrs - Dr. Bluford Kaufmann   COLONOSCOPY WITH PROPOFOL N/A 08/07/2022   Procedure: COLONOSCOPY WITH PROPOFOL;  Surgeon: Midge Minium, MD;  Location: [facility];  Service: Endoscopy;  Laterality: N/A;  needs to be first case   ESOPHAGOGASTRODUODENOSCOPY (EGD) WITH PROPOFOL N/A 11/22/2015   Procedure: ESOPHAGOGASTRODUODENOSCOPY (EGD) WITH PROPOFOL;  Surgeon: Midge Minium, MD;  Location: [facility];  Service: Endoscopy;  Laterality: N/A;   FACIAL COSMETIC SURGERY     gender re-affirmation     HERNIA REPAIR     x2   POLYPECTOMY  08/07/2022   Procedure: POLYPECTOMY;  Surgeon: Midge Minium, MD;  Location: [facility];  Service: Endoscopy;;   PORTA CATH REMOVAL N/A 04/10/2019   Procedure: PORTA CATH REMOVAL;  Surgeon: Annice Needy, MD;  Location: [facility];  Service: Cardiovascular;  Laterality: N/A;    Current Medications: Current Meds  Medication Sig   aspirin 81 MG tablet Take 81 mg by mouth daily.   clopidogrel (PLAVIX) 75 MG tablet Take 1 tablet (75 mg  total) by mouth daily. OFFICE VISIT NEEDED FOR ADDITIONAL REFILLS   dupilumab (DUPIXENT) 300 MG/2ML prefilled syringe INJECT 1 SYRINGE UNDER THE SKIN AS DIRECTED EVERY OTHER WEEK   estradiol (ESTRACE) 2 MG tablet Take 1 tablet (2 mg total) by mouth daily.   finasteride (PROSCAR) 5 MG tablet Take 1 tablet (5 mg total) by mouth daily.   hyoscyamine (ANASPAZ) 0.125 MG TBDP disintergrating tablet Place 1 tablet (0.125 mg total) under the tongue every 6 (six) hours as needed.   isosorbide mononitrate (IMDUR) 30 MG 24 hr tablet Take 1 tablet (30 mg total) by mouth daily.   meclizine (ANTIVERT) 25 MG tablet Take 1 tablet (25 mg total) by mouth 3 (three) times daily as needed for dizziness.   metoprolol succinate  (TOPROL XL) 25 MG 24 hr tablet Take 1 tablet (25 mg total) by mouth daily.   mupirocin ointment (BACTROBAN) 2 % Apply 1 Application topically 2 (two) times daily.   nystatin cream (MYCOSTATIN) Apply to the affected area(s) 2 (two) times daily.   ondansetron (ZOFRAN) 4 MG tablet Take 1 tablet (4 mg total) by mouth every 8 (eight) hours as needed for nausea or vomiting.   pantoprazole (PROTONIX) 40 MG tablet Take 1 tablet (40 mg total) by mouth daily.   rosuvastatin (CRESTOR) 40 MG tablet Take 1 tablet (40 mg total) by mouth daily.   Ruxolitinib Phosphate (OPZELURA) 1.5 % CREA Apply 1 Application topically as directed. Qd to aa eczema   sulfacetamide (BLEPH-10) 10 % ophthalmic solution Place 1 drop into both eyes every 3 (three) hours.   triamcinolone cream (KENALOG) 0.1 % Apply 1 application topically 2 (two) times daily. To rash on legs   valsartan (DIOVAN) 160 MG tablet Take 1 tablet (160 mg total) by mouth daily.   [DISCONTINUED] amLODipine (NORVASC) 5 MG tablet Take 1 tablet (5 mg total) by mouth daily.     Allergies:   Patient has no known allergies.   Social History   Socioeconomic History   Marital status: Married    Spouse name: Not on file   Number of children: Not on file   Years of education: Not on file   Highest education level: Not on file  Occupational History   Not on file  Tobacco Use   Smoking status: Never   Smokeless tobacco: Never  Vaping Use   Vaping status: Never Used  Substance and Sexual Activity   Alcohol use: Yes    Alcohol/week: 2.0 standard drinks of alcohol    Types: 2 Shots of liquor per week    Comment: 1 Margarita - Wed and Sat   Drug use: Never   Sexual activity: Not on file  Other Topics Concern   Not on file  Social History Narrative   Not on file   Social Determinants of Health   Financial Resource Strain: Low Risk  (04/10/2019)   Overall Financial Resource Strain (CARDIA)    Difficulty of Paying Living Expenses: Not hard at all  Food  Insecurity: No Food Insecurity (02/03/2022)   Hunger Vital Sign    Worried About Running Out of Food in the Last Year: Never true    Ran Out of Food in the Last Year: Never true  Transportation Needs: No Transportation Needs (02/03/2022)   PRAPARE - Administrator, Civil Service (Medical): No    Lack of Transportation (Non-Medical): No  Physical Activity: Insufficiently Active (02/03/2022)   Exercise Vital Sign    Days of Exercise per Week:  2 days    Minutes of Exercise per Session: 60 min  Stress: Stress Concern Present (02/03/2022)   [employer] of Occupational Health - Occupational Stress Questionnaire    Feeling of Stress : Very much  Social Connections: Moderately Integrated (02/03/2022)   Social Connection and Isolation Panel [NHANES]    Frequency of Communication with Friends and Family: More than three times a week    Frequency of Social Gatherings with Friends and Family: More than three times a week    Attends Religious Services: 1 to 4 times per year    Active Member of [employer] or Organizations: No    Attends Banker Meetings: Never    Marital Status: Married     Family History: The patient's family history includes Breast cancer (age of onset: 3) in her maternal aunt; Heart attack in her mother.  ROS:   Please see the history of present illness.     All other systems reviewed and are negative.  EKGs/Labs/Other Studies Reviewed:    The following studies were reviewed today:       Recent Labs: 12/22/2022: ALT 16; BUN 12; Creatinine 0.8; Hemoglobin 13.0; Platelets 251; Potassium 3.7; Sodium 140; TSH 1.68  Recent Lipid Panel    Component Value Date/Time   CHOL 116 12/22/2022 0000   CHOL 125 05/04/2019 0803   TRIG 92 12/22/2022 0000   HDL 47 12/22/2022 0000   HDL 39 (L) 05/04/2019 0803   CHOLHDL 3.2 05/04/2019 0803   LDLCALC 51 12/22/2022 0000   LDLCALC 66 05/04/2019 0803     Risk Assessment/Calculations:             Physical Exam:     VS:  BP 124/82 (BP Location: Left Arm, Patient Position: Sitting, Cuff Size: Normal)   Pulse 64   Ht 5\' 11"  (1.803 m)   Wt 195 lb (88.5 kg)   SpO2 97%   BMI 27.20 kg/m     Wt Readings from Last 3 Encounters:  06/16/23 195 lb (88.5 kg)  04/14/23 192 lb 12.8 oz (87.5 kg)  01/12/23 192 lb 6.4 oz (87.3 kg)     GEN:  Well nourished, well developed in no acute distress HEENT: Normal NECK: No JVD; No carotid bruits CARDIAC: RRR, no murmurs, rubs, gallops RESPIRATORY:  Clear to auscultation without rales, wheezing or rhonchi  ABDOMEN: Soft, non-tender, non-distended MUSCULOSKELETAL:  No edema; No deformity  SKIN: Warm and dry NEUROLOGIC:  Alert and oriented x 3 PSYCHIATRIC:  Normal affect   ASSESSMENT:    1. Dyspnea on exertion   2. Coronary artery disease of native artery of native heart with stable angina pectoris (HCC)   3. Primary hypertension     PLAN:    In order of problems listed above:  Dyspnea on exertion, could be an anginal equivalent.  Last stress test showed fixed apical defects.  Patient is high risk.  Obtain right and left heart cath. CAD s/p PCI x 9.  History of stable angina, currently with dyspnea on exertion as above.  Start Toprol-XL 25 mg daily, continue Imdur 30 mg daily, aspirin, Plavix, Crestor 40 mg daily.  LDL at goal..  EF 50-55%. Hypertension, BP controlled, continue valsartan 160 mg daily, start Toprol-XL 25 mg daily for antianginal benefit.  Stop Norvasc.  Follow-up in 6 weeks  Informed Consent   Shared Decision Making/Informed Consent The risks [stroke (1 in 1000), death (1 in 1000), kidney failure [usually temporary] (1 in 500), bleeding (1 in 200), allergic  reaction [possibly serious] (1 in 200)], benefits (diagnostic support and management of coronary artery disease) and alternatives of a cardiac catheterization were discussed in detail with Kendra Salas and she is willing to proceed.          Medication Adjustments/Labs and Tests  Ordered: Current medicines are reviewed at length with the patient today.  Concerns regarding medicines are outlined above.  Orders Placed This Encounter  Procedures   Basic Metabolic Panel (BMET)   CBC   Meds ordered this encounter  Medications   metoprolol succinate (TOPROL XL) 25 MG 24 hr tablet    Sig: Take 1 tablet (25 mg total) by mouth daily.    Dispense:  90 tablet    Refill:  3    Patient Instructions  Medication Instructions:   STOP amlodipine  START Toprol XL - Take one tablet (25mg ) by mouth daily.   *If you need a refill on your cardiac medications before your next appointment, please call your pharmacy*   Lab Work:  1,. Your provider would like for you to have labs today BMP / CBC  If you have labs (blood work) drawn today and your tests are completely normal, you will receive your results only by: MyChart Message (if you have MyChart) OR A paper copy in the mail If you have any lab test that is abnormal or we need to change your treatment, we will call you to review the results.   Testing/Procedures:   [place] [facility] [place] [address], [address] [place] [address] Dept: [phone] Loc: [phone]  Kendra Salas  06/16/2023  You are scheduled for a Cardiac Catheterization on Thursday, September 12 with Dr. Lorine Bears.  1. Please arrive at the [facility] Entrance of [facility], [address], [place] [address] at 11:00 AM (This is 1 hour(s) prior to your procedure time).  Proceed to the Check-In Desk directly inside the entrance.  Procedure Parking: Use the entrance off of the [address] side of the hospital. Turn right upon entering and follow the driveway to parking that is directly in front of the [facility] Center. There is no valet parking available at this entrance, however there is an awning directly in front of the Heart  & Vascular Center for drop off/ pick up for patients.  Special note: Every effort is made to have your procedure done on time. Please understand that emergencies sometimes delay scheduled procedures.  2. Diet: Do not eat solid foods after midnight.  The patient may have clear liquids until 5am upon the day of the procedure.  3. Labs: You will need to have blood drawn TODAY  4. Medication instructions in preparation for your procedure:   Contrast Allergy: No  On the morning of your procedure, take your Plavix/Clopidogrel and any morning medicines NOT listed above.  You may use sips of water.  5. Plan to go home the same day, you will only stay overnight if medically necessary. 6. Bring a current list of your medications and current insurance cards. 7. You MUST have a responsible person to drive you home. 8. Someone MUST be with you the first 24 hours after you arrive home or your discharge will be delayed. 9. Please wear clothes that are easy to get on and off and wear slip-on shoes.  Thank you for allowing [place] to care for you!   -- [facility]  Invasive Cardiovascular services    Follow-Up: At [facility], you and your health needs are our priority.  As part of our continuing mission to provide you with exceptional heart care, we have created designated Provider Care Teams.  These Care Teams include your primary Cardiologist (physician) and Advanced Practice Providers (APPs -  Physician Assistants and Nurse Practitioners) who all work together to provide you with the care you need, when you need it.  We recommend signing up for the patient portal called "MyChart".  Sign up information is provided on this After Visit Summary.  MyChart is used to connect with patients for Virtual Visits (Telemedicine).  Patients are able to view lab/test results, encounter notes, upcoming appointments, etc.  Non-urgent messages can be sent to your provider as well.   To learn more about what you can  do with MyChart, go to [email or web address].    Your next appointment:   6 week(s)  Provider:   You may see Debbe Odea, MD or one of the following Advanced Practice Providers on your designated Care Team:   Nicolasa Ducking, NP Eula Listen, PA-C Cadence Fransico Michael, PA-C Charlsie Quest, NP   Signed, Debbe Odea, MD  06/16/2023 10:02 AM     [facility]

## 2023-06-16 NOTE — H&P (View-Only) (Signed)
Cardiology Office Note:    Date:  06/16/2023   ID:  Kendra Salas, DOB 02/05/1951, MRN [id number]  PCP:  Reubin Milan, MD   [facility] Providers Cardiologist:  Debbe Odea, MD     Referring MD: Reubin Milan, MD   Chief Complaint  Patient presents with   Follow-up    Discuss cardiac testing results.  Dyspnea on exertion not improving.      History of Present Illness:    Kendra Salas is a 72 y.o. adult with a hx of CAD/PCI x 9 (8 stents to LAD and  one to D2), hypertension, hyperlipidemia, who presents for follow-up.  Previously seen to establish care, endorses shortness of breath with exertion over the past 6 months.  Finds it difficult to walk up a hill or long distances while shopping.  Denies any chest pains today on the past several weeks.  Stress test about a year ago showed a fixed apical defect.  Left heart cath about 2 years ago showed patent LAD stents.  Prior notes/studies Left heart cath 03/2021 s/p PCI of D2, prior LAD stents mild ISR. Lexiscan Myoview 10/2021 fixed apical defect consistent with prior infarct.  Past Medical History:  Diagnosis Date   Anxiety    Basal cell carcinoma 03/07/2008   left dorsum mid forearm   Basal cell carcinoma 04/19/2013   left distal lat ant thigh   Basal cell carcinoma 04/11/2014   right forearm, right post shoulder near deltoid   Basal cell carcinoma 10/12/2018   left mid back 7.0 cm lat to spine   CAD (coronary artery disease)    Clotting disorder (HCC)    Dysplastic nevus 02/02/2007   left volar mid to prox forearm   Dysplastic nevus 07/09/2010   right medial breast 1.5 cm med to areola, right med breast parasternal, right post distal deltoid   Eczema    GERD (gastroesophageal reflux disease)    Hyperlipemia    Hypertension    Lymphoma (HCC) 2015   Melanoma in situ (HCC) 12/22/2006   right medial breast, med sup quad   Personal history of chemotherapy 2017   6 months of chemo    Past  Surgical History:  Procedure Laterality Date   AUGMENTATION MAMMAPLASTY Bilateral 2003   CARDIAC CATHETERIZATION     X3 - has 9 stents. 4 LAD, 1 posterior, Most recent cath 03/2021   COLONOSCOPY  06/29/2012   repeat 10 yrs - Dr. Bluford Kaufmann   COLONOSCOPY WITH PROPOFOL N/A 08/07/2022   Procedure: COLONOSCOPY WITH PROPOFOL;  Surgeon: Midge Minium, MD;  Location: [facility];  Service: Endoscopy;  Laterality: N/A;  needs to be first case   ESOPHAGOGASTRODUODENOSCOPY (EGD) WITH PROPOFOL N/A 11/22/2015   Procedure: ESOPHAGOGASTRODUODENOSCOPY (EGD) WITH PROPOFOL;  Surgeon: Midge Minium, MD;  Location: [facility];  Service: Endoscopy;  Laterality: N/A;   FACIAL COSMETIC SURGERY     gender re-affirmation     HERNIA REPAIR     x2   POLYPECTOMY  08/07/2022   Procedure: POLYPECTOMY;  Surgeon: Midge Minium, MD;  Location: [facility];  Service: Endoscopy;;   PORTA CATH REMOVAL N/A 04/10/2019   Procedure: PORTA CATH REMOVAL;  Surgeon: Annice Needy, MD;  Location: [facility];  Service: Cardiovascular;  Laterality: N/A;    Current Medications: Current Meds  Medication Sig   aspirin 81 MG tablet Take 81 mg by mouth daily.   clopidogrel (PLAVIX) 75 MG tablet Take 1 tablet (75 mg  total) by mouth daily. OFFICE VISIT NEEDED FOR ADDITIONAL REFILLS   dupilumab (DUPIXENT) 300 MG/2ML prefilled syringe INJECT 1 SYRINGE UNDER THE SKIN AS DIRECTED EVERY OTHER WEEK   estradiol (ESTRACE) 2 MG tablet Take 1 tablet (2 mg total) by mouth daily.   finasteride (PROSCAR) 5 MG tablet Take 1 tablet (5 mg total) by mouth daily.   hyoscyamine (ANASPAZ) 0.125 MG TBDP disintergrating tablet Place 1 tablet (0.125 mg total) under the tongue every 6 (six) hours as needed.   isosorbide mononitrate (IMDUR) 30 MG 24 hr tablet Take 1 tablet (30 mg total) by mouth daily.   meclizine (ANTIVERT) 25 MG tablet Take 1 tablet (25 mg total) by mouth 3 (three) times daily as needed for dizziness.   metoprolol succinate  (TOPROL XL) 25 MG 24 hr tablet Take 1 tablet (25 mg total) by mouth daily.   mupirocin ointment (BACTROBAN) 2 % Apply 1 Application topically 2 (two) times daily.   nystatin cream (MYCOSTATIN) Apply to the affected area(s) 2 (two) times daily.   ondansetron (ZOFRAN) 4 MG tablet Take 1 tablet (4 mg total) by mouth every 8 (eight) hours as needed for nausea or vomiting.   pantoprazole (PROTONIX) 40 MG tablet Take 1 tablet (40 mg total) by mouth daily.   rosuvastatin (CRESTOR) 40 MG tablet Take 1 tablet (40 mg total) by mouth daily.   Ruxolitinib Phosphate (OPZELURA) 1.5 % CREA Apply 1 Application topically as directed. Qd to aa eczema   sulfacetamide (BLEPH-10) 10 % ophthalmic solution Place 1 drop into both eyes every 3 (three) hours.   triamcinolone cream (KENALOG) 0.1 % Apply 1 application topically 2 (two) times daily. To rash on legs   valsartan (DIOVAN) 160 MG tablet Take 1 tablet (160 mg total) by mouth daily.   [DISCONTINUED] amLODipine (NORVASC) 5 MG tablet Take 1 tablet (5 mg total) by mouth daily.     Allergies:   Patient has no known allergies.   Social History   Socioeconomic History   Marital status: Married    Spouse name: Not on file   Number of children: Not on file   Years of education: Not on file   Highest education level: Not on file  Occupational History   Not on file  Tobacco Use   Smoking status: Never   Smokeless tobacco: Never  Vaping Use   Vaping status: Never Used  Substance and Sexual Activity   Alcohol use: Yes    Alcohol/week: 2.0 standard drinks of alcohol    Types: 2 Shots of liquor per week    Comment: 1 Margarita - Wed and Sat   Drug use: Never   Sexual activity: Not on file  Other Topics Concern   Not on file  Social History Narrative   Not on file   Social Determinants of Health   Financial Resource Strain: Low Risk  (04/10/2019)   Overall Financial Resource Strain (CARDIA)    Difficulty of Paying Living Expenses: Not hard at all  Food  Insecurity: No Food Insecurity (02/03/2022)   Hunger Vital Sign    Worried About Running Out of Food in the Last Year: Never true    Ran Out of Food in the Last Year: Never true  Transportation Needs: No Transportation Needs (02/03/2022)   PRAPARE - Administrator, Civil Service (Medical): No    Lack of Transportation (Non-Medical): No  Physical Activity: Insufficiently Active (02/03/2022)   Exercise Vital Sign    Days of Exercise per Week:  2 days    Minutes of Exercise per Session: 60 min  Stress: Stress Concern Present (02/03/2022)   [employer] of Occupational Health - Occupational Stress Questionnaire    Feeling of Stress : Very much  Social Connections: Moderately Integrated (02/03/2022)   Social Connection and Isolation Panel [NHANES]    Frequency of Communication with Friends and Family: More than three times a week    Frequency of Social Gatherings with Friends and Family: More than three times a week    Attends Religious Services: 1 to 4 times per year    Active Member of [employer] or Organizations: No    Attends Banker Meetings: Never    Marital Status: Married     Family History: The patient's family history includes Breast cancer (age of onset: 91) in her maternal aunt; Heart attack in her mother.  ROS:   Please see the history of present illness.     All other systems reviewed and are negative.  EKGs/Labs/Other Studies Reviewed:    The following studies were reviewed today:       Recent Labs: 12/22/2022: ALT 16; BUN 12; Creatinine 0.8; Hemoglobin 13.0; Platelets 251; Potassium 3.7; Sodium 140; TSH 1.68  Recent Lipid Panel    Component Value Date/Time   CHOL 116 12/22/2022 0000   CHOL 125 05/04/2019 0803   TRIG 92 12/22/2022 0000   HDL 47 12/22/2022 0000   HDL 39 (L) 05/04/2019 0803   CHOLHDL 3.2 05/04/2019 0803   LDLCALC 51 12/22/2022 0000   LDLCALC 66 05/04/2019 0803     Risk Assessment/Calculations:             Physical Exam:     VS:  BP 124/82 (BP Location: Left Arm, Patient Position: Sitting, Cuff Size: Normal)   Pulse 64   Ht 5\' 11"  (1.803 m)   Wt 195 lb (88.5 kg)   SpO2 97%   BMI 27.20 kg/m     Wt Readings from Last 3 Encounters:  06/16/23 195 lb (88.5 kg)  04/14/23 192 lb 12.8 oz (87.5 kg)  01/12/23 192 lb 6.4 oz (87.3 kg)     GEN:  Well nourished, well developed in no acute distress HEENT: Normal NECK: No JVD; No carotid bruits CARDIAC: RRR, no murmurs, rubs, gallops RESPIRATORY:  Clear to auscultation without rales, wheezing or rhonchi  ABDOMEN: Soft, non-tender, non-distended MUSCULOSKELETAL:  No edema; No deformity  SKIN: Warm and dry NEUROLOGIC:  Alert and oriented x 3 PSYCHIATRIC:  Normal affect   ASSESSMENT:    1. Dyspnea on exertion   2. Coronary artery disease of native artery of native heart with stable angina pectoris (HCC)   3. Primary hypertension     PLAN:    In order of problems listed above:  Dyspnea on exertion, could be an anginal equivalent.  Last stress test showed fixed apical defects.  Patient is high risk.  Obtain right and left heart cath. CAD s/p PCI x 9.  History of stable angina, currently with dyspnea on exertion as above.  Start Toprol-XL 25 mg daily, continue Imdur 30 mg daily, aspirin, Plavix, Crestor 40 mg daily.  LDL at goal..  EF 50-55%. Hypertension, BP controlled, continue valsartan 160 mg daily, start Toprol-XL 25 mg daily for antianginal benefit.  Stop Norvasc.  Follow-up in 6 weeks  Informed Consent   Shared Decision Making/Informed Consent The risks [stroke (1 in 1000), death (1 in 1000), kidney failure [usually temporary] (1 in 500), bleeding (1 in 200), allergic  reaction [possibly serious] (1 in 200)], benefits (diagnostic support and management of coronary artery disease) and alternatives of a cardiac catheterization were discussed in detail with Ms. Hemker and she is willing to proceed.          Medication Adjustments/Labs and Tests  Ordered: Current medicines are reviewed at length with the patient today.  Concerns regarding medicines are outlined above.  Orders Placed This Encounter  Procedures   Basic Metabolic Panel (BMET)   CBC   Meds ordered this encounter  Medications   metoprolol succinate (TOPROL XL) 25 MG 24 hr tablet    Sig: Take 1 tablet (25 mg total) by mouth daily.    Dispense:  90 tablet    Refill:  3    Patient Instructions  Medication Instructions:   STOP amlodipine  START Toprol XL - Take one tablet (25mg ) by mouth daily.   *If you need a refill on your cardiac medications before your next appointment, please call your pharmacy*   Lab Work:  1,. Your provider would like for you to have labs today BMP / CBC  If you have labs (blood work) drawn today and your tests are completely normal, you will receive your results only by: MyChart Message (if you have MyChart) OR A paper copy in the mail If you have any lab test that is abnormal or we need to change your treatment, we will call you to review the results.   Testing/Procedures:   [place] [facility] DEPT [facility] [place] [address] [place], [address] [place] [address] Dept: [phone] Loc: [phone]  MIRSA FATE  06/16/2023  You are scheduled for a Cardiac Catheterization on Thursday, September 12 with Dr. Lorine Bears.  1. Please arrive at the [facility] Entrance of [facility], [address], [place] [address] at 11:00 AM (This is 1 hour(s) prior to your procedure time).  Proceed to the Check-In Desk directly inside the entrance.  Procedure Parking: Use the entrance off of the [address] side of the hospital. Turn right upon entering and follow the driveway to parking that is directly in front of the Heart & Vascular Center. There is no valet parking available at this entrance, however there is an awning directly in front of the Heart  & Vascular Center for drop off/ pick up for patients.  Special note: Every effort is made to have your procedure done on time. Please understand that emergencies sometimes delay scheduled procedures.  2. Diet: Do not eat solid foods after midnight.  The patient may have clear liquids until 5am upon the day of the procedure.  3. Labs: You will need to have blood drawn TODAY  4. Medication instructions in preparation for your procedure:   Contrast Allergy: No  On the morning of your procedure, take your Plavix/Clopidogrel and any morning medicines NOT listed above.  You may use sips of water.  5. Plan to go home the same day, you will only stay overnight if medically necessary. 6. Bring a current list of your medications and current insurance cards. 7. You MUST have a responsible person to drive you home. 8. Someone MUST be with you the first 24 hours after you arrive home or your discharge will be delayed. 9. Please wear clothes that are easy to get on and off and wear slip-on shoes.  Thank you for allowing [place] to care for you!   -- [facility]  Invasive Cardiovascular services    Follow-Up: At [facility], you and your health needs are our priority.  As part of our continuing mission to provide you with exceptional heart care, we have created designated Provider Care Teams.  These Care Teams include your primary Cardiologist (physician) and Advanced Practice Providers (APPs -  Physician Assistants and Nurse Practitioners) who all work together to provide you with the care you need, when you need it.  We recommend signing up for the patient portal called "MyChart".  Sign up information is provided on this After Visit Summary.  MyChart is used to connect with patients for Virtual Visits (Telemedicine).  Patients are able to view lab/test results, encounter notes, upcoming appointments, etc.  Non-urgent messages can be sent to your provider as well.   To learn more about what you can  do with MyChart, go to [email or web address].    Your next appointment:   6 week(s)  Provider:   You may see Debbe Odea, MD or one of the following Advanced Practice Providers on your designated Care Team:   Nicolasa Ducking, NP Eula Listen, PA-C Cadence Fransico Michael, PA-C Charlsie Quest, NP   Signed, Debbe Odea, MD  06/16/2023 10:02 AM    [facility]

## 2023-06-17 LAB — CBC
Hematocrit: 40.2 % (ref 34.0–46.6)
Hemoglobin: 13.1 g/dL (ref 11.1–15.9)
MCH: 27.8 pg (ref 26.6–33.0)
MCHC: 32.6 g/dL (ref 31.5–35.7)
MCV: 85 fL (ref 79–97)
Platelets: 247 10*3/uL (ref 150–450)
RBC: 4.71 x10E6/uL (ref 3.77–5.28)
RDW: 13.2 % (ref 11.7–15.4)
WBC: 6.6 10*3/uL (ref 3.4–10.8)

## 2023-06-17 LAB — BASIC METABOLIC PANEL
BUN/Creatinine Ratio: 13 (ref 12–28)
BUN: 11 mg/dL (ref 8–27)
CO2: 22 mmol/L (ref 20–29)
Calcium: 8.8 mg/dL (ref 8.7–10.3)
Chloride: 105 mmol/L (ref 96–106)
Creatinine, Ser: 0.84 mg/dL (ref 0.57–1.00)
Glucose: 95 mg/dL (ref 70–99)
Potassium: 4 mmol/L (ref 3.5–5.2)
Sodium: 141 mmol/L (ref 134–144)
eGFR: 74 mL/min/{1.73_m2} (ref >59)

## 2023-06-18 ENCOUNTER — Ambulatory Visit
Admission: RE | Admit: 2023-06-18 | Discharge: 2023-06-18 | Disposition: A | Discharge status: Home / Self Care | Payer: Commercial Managed Care - PPO | Attending: Cardiovascular Disease | Admitting: Cardiovascular Disease

## 2023-06-18 ENCOUNTER — Encounter
Admission: RE | Disposition: A | Discharge status: Home / Self Care | Payer: Self-pay | Source: Home / Self Care | Attending: Cardiovascular Disease

## 2023-06-18 ENCOUNTER — Other Ambulatory Visit (HOSPITAL_COMMUNITY): Payer: Self-pay

## 2023-06-18 ENCOUNTER — Other Ambulatory Visit: Payer: Self-pay

## 2023-06-18 ENCOUNTER — Encounter: Payer: Self-pay | Admitting: Dermatology

## 2023-06-18 ENCOUNTER — Telehealth: Payer: Self-pay | Admitting: *Deleted

## 2023-06-18 ENCOUNTER — Encounter: Payer: Self-pay | Admitting: Cardiovascular Disease

## 2023-06-18 DIAGNOSIS — E785 Hyperlipidemia, unspecified: Secondary | ICD-10-CM | POA: Insufficient documentation

## 2023-06-18 DIAGNOSIS — I10 Essential (primary) hypertension: Secondary | ICD-10-CM | POA: Diagnosis not present

## 2023-06-18 DIAGNOSIS — Z955 Presence of coronary angioplasty implant and graft: Secondary | ICD-10-CM | POA: Insufficient documentation

## 2023-06-18 DIAGNOSIS — T82855A Stenosis of coronary artery stent, initial encounter: Secondary | ICD-10-CM | POA: Insufficient documentation

## 2023-06-18 DIAGNOSIS — I2584 Coronary atherosclerosis due to calcified coronary lesion: Secondary | ICD-10-CM | POA: Diagnosis not present

## 2023-06-18 DIAGNOSIS — Y832 Surgical operation with anastomosis, bypass or graft as the cause of abnormal reaction of the patient, or of later complication, without mention of misadventure at the time of the procedure: Secondary | ICD-10-CM | POA: Insufficient documentation

## 2023-06-18 DIAGNOSIS — Z79899 Other long term (current) drug therapy: Secondary | ICD-10-CM | POA: Insufficient documentation

## 2023-06-18 DIAGNOSIS — R0609 Other forms of dyspnea: Secondary | ICD-10-CM | POA: Diagnosis not present

## 2023-06-18 DIAGNOSIS — I2582 Chronic total occlusion of coronary artery: Secondary | ICD-10-CM | POA: Diagnosis not present

## 2023-06-18 DIAGNOSIS — I25118 Atherosclerotic heart disease of native coronary artery with other forms of angina pectoris: Secondary | ICD-10-CM

## 2023-06-18 HISTORY — PX: RIGHT/LEFT HEART CATH AND CORONARY ANGIOGRAPHY: CATH118266

## 2023-06-18 LAB — POCT I-STAT 7, (LYTES, BLD GAS, ICA,H+H)
Acid-base deficit: 3 mmol/L — ABNORMAL HIGH (ref 0.0–2.0)
Bicarbonate: 21.5 mmol/L (ref 20.0–28.0)
Calcium, Ion: 1.19 mmol/L (ref 1.15–1.40)
HCT: 35 % — ABNORMAL LOW (ref 36.0–46.0)
Hemoglobin: 11.9 g/dL — ABNORMAL LOW (ref 12.0–15.0)
O2 Saturation: 94 %
Potassium: 3.6 mmol/L (ref 3.5–5.1)
Sodium: 140 mmol/L (ref 135–145)
TCO2: 23 mmol/L (ref 22–32)
pCO2 arterial: 37 mmHg (ref 32–48)
pH, Arterial: 7.372 (ref 7.35–7.45)
pO2, Arterial: 73 mmHg — ABNORMAL LOW (ref 83–108)

## 2023-06-18 LAB — POCT I-STAT EG7
Acid-base deficit: 2 mmol/L (ref 0.0–2.0)
Bicarbonate: 23.2 mmol/L (ref 20.0–28.0)
Calcium, Ion: 1.19 mmol/L (ref 1.15–1.40)
HCT: 35 % — ABNORMAL LOW (ref 36.0–46.0)
Hemoglobin: 11.9 g/dL — ABNORMAL LOW (ref 12.0–15.0)
O2 Saturation: 70 %
Potassium: 3.6 mmol/L (ref 3.5–5.1)
Sodium: 140 mmol/L (ref 135–145)
TCO2: 24 mmol/L (ref 22–32)
pCO2, Ven: 41.9 mmHg — ABNORMAL LOW (ref 44–60)
pH, Ven: 7.351 (ref 7.25–7.43)
pO2, Ven: 39 mmHg (ref 32–45)

## 2023-06-18 SURGERY — RIGHT/LEFT HEART CATH AND CORONARY ANGIOGRAPHY
Anesthesia: Moderate Sedation | Laterality: Bilateral

## 2023-06-18 MED ORDER — ACETAMINOPHEN 325 MG PO TABS: 650.0000 mg | ORAL_TABLET | ORAL | Status: DC | PRN

## 2023-06-18 MED ORDER — SODIUM CHLORIDE 0.9 % WEIGHT BASED INFUSION: 3.0000 mL/kg/h | INTRAVENOUS | Status: AC

## 2023-06-18 MED ORDER — VERAPAMIL HCL 2.5 MG/ML IV SOLN
INTRAVENOUS | Status: DC | PRN
  Administered 2023-06-18: 2.5 mg via INTRAVENOUS

## 2023-06-18 MED ORDER — SODIUM CHLORIDE 0.9% FLUSH: 3.0000 mL | Freq: Two times a day (BID) | INTRAVENOUS | Status: DC

## 2023-06-18 MED ORDER — HEPARIN (PORCINE) IN NACL 1000-0.9 UT/500ML-% IV SOLN
INTRAVENOUS | Status: AC
  Filled 2023-06-18: qty 1000

## 2023-06-18 MED ORDER — LIDOCAINE HCL (PF) 1 % IJ SOLN
INTRAMUSCULAR | Status: DC | PRN
  Administered 2023-06-18: 2 mL

## 2023-06-18 MED ORDER — MIDAZOLAM HCL 2 MG/2ML IJ SOLN
INTRAMUSCULAR | Status: AC
  Filled 2023-06-18: qty 2

## 2023-06-18 MED ORDER — HEPARIN SODIUM (PORCINE) 1000 UNIT/ML IJ SOLN
INTRAMUSCULAR | Status: DC | PRN
  Administered 2023-06-18: 4000 [IU] via INTRAVENOUS

## 2023-06-18 MED ORDER — MIDAZOLAM HCL 2 MG/2ML IJ SOLN
INTRAMUSCULAR | Status: DC | PRN
  Administered 2023-06-18: 1 mg via INTRAVENOUS

## 2023-06-18 MED ORDER — LIDOCAINE HCL 1 % IJ SOLN
INTRAMUSCULAR | Status: AC
  Filled 2023-06-18: qty 20

## 2023-06-18 MED ORDER — VERAPAMIL HCL 2.5 MG/ML IV SOLN
INTRAVENOUS | Status: AC
  Filled 2023-06-18: qty 2

## 2023-06-18 MED ORDER — FENTANYL CITRATE (PF) 100 MCG/2ML IJ SOLN
INTRAMUSCULAR | Status: DC | PRN
  Administered 2023-06-18: 25 ug via INTRAVENOUS

## 2023-06-18 MED ORDER — IOHEXOL 300 MG/ML  SOLN
INTRAMUSCULAR | Status: DC | PRN
  Administered 2023-06-18: 76 mL

## 2023-06-18 MED ORDER — ONDANSETRON HCL 4 MG/2ML IJ SOLN: 4.0000 mg | Freq: Four times a day (QID) | INTRAMUSCULAR | Status: DC | PRN

## 2023-06-18 MED ORDER — SODIUM CHLORIDE 0.9% FLUSH: 3.0000 mL | INTRAVENOUS | Status: DC | PRN

## 2023-06-18 MED ORDER — HEPARIN (PORCINE) IN NACL 2000-0.9 UNIT/L-% IV SOLN
INTRAVENOUS | Status: DC | PRN
  Administered 2023-06-18: 1000 mL

## 2023-06-18 MED ORDER — HEPARIN SODIUM (PORCINE) 1000 UNIT/ML IJ SOLN
INTRAMUSCULAR | Status: AC
  Filled 2023-06-18: qty 10

## 2023-06-18 MED ORDER — FENTANYL CITRATE (PF) 100 MCG/2ML IJ SOLN
INTRAMUSCULAR | Status: AC
  Filled 2023-06-18: qty 2

## 2023-06-18 MED ORDER — SODIUM CHLORIDE 0.9 % WEIGHT BASED INFUSION: 1.0000 mL/kg/h | INTRAVENOUS | Status: DC

## 2023-06-18 MED ORDER — SODIUM CHLORIDE 0.9 % IV SOLN: INTRAVENOUS | Status: DC

## 2023-06-18 MED ORDER — SODIUM CHLORIDE 0.9 % IV SOLN: 250.0000 mL | INTRAVENOUS | Status: DC | PRN

## 2023-06-18 SURGICAL SUPPLY — 16 items
CATH AMP RT 5F (CATHETERS) IMPLANT
CATH BALLN WEDGE 5F 110CM (CATHETERS) IMPLANT
CATH INFINITI 5 FR 3DRC (CATHETERS) IMPLANT
CATH INFINITI 5 FR JL3.5 (CATHETERS) IMPLANT
CATH INFINITI AMBI 5FR JK (CATHETERS) IMPLANT
DEVICE RAD TR BAND REGULAR (VASCULAR PRODUCTS) IMPLANT
DRAPE BRACHIAL (DRAPES) IMPLANT
GLIDESHEATH SLEND SS 6F .021 (SHEATH) IMPLANT
GUIDEWIRE EMER 3M J .025X150CM (WIRE) IMPLANT
GUIDEWIRE INQWIRE 1.5J.035X260 (WIRE) IMPLANT
INQWIRE 1.5J .035X260CM (WIRE) ×1
PACK CARDIAC CATH (CUSTOM PROCEDURE TRAY) ×1 IMPLANT
PROTECTION STATION PRESSURIZED (MISCELLANEOUS) ×1
SET ATX-X65L (MISCELLANEOUS) IMPLANT
SHEATH GLIDE SLENDER 4/5FR (SHEATH) IMPLANT
STATION PROTECTION PRESSURIZED (MISCELLANEOUS) IMPLANT

## 2023-06-18 NOTE — Telephone Encounter (Signed)
Spoke with patient to confirm her scheduled heart cath today. Instructed her to arrive at 12:30 pm with procedure time of 1:30 pm. She verbalized understanding with procedure time change and had no further questions.

## 2023-06-18 NOTE — Interval H&P Note (Signed)
History and Physical Interval Note:  06/18/2023 3:40 PM  Kendra Salas  has presented today for surgery, with the diagnosis of R and L Cath    Dyspnea on exertion.  The various methods of treatment have been discussed with the patient and family. After consideration of risks, benefits and other options for treatment, the patient has consented to  Procedure(s): RIGHT/LEFT HEART CATH AND CORONARY ANGIOGRAPHY (Bilateral) as a surgical intervention.  The patient's history has been reviewed, patient examined, no change in status, stable for surgery.  I have reviewed the patient's chart and labs.  Questions were answered to the patient's satisfaction.     Lorine Bears

## 2023-06-21 ENCOUNTER — Encounter: Payer: Self-pay | Admitting: Cardiovascular Disease

## 2023-06-21 LAB — SURGICAL PATHOLOGY

## 2023-06-22 ENCOUNTER — Other Ambulatory Visit: Payer: Self-pay

## 2023-06-22 ENCOUNTER — Telehealth: Payer: Self-pay

## 2023-06-22 MED FILL — Isosorbide Mononitrate Tab ER 24HR 30 MG: ORAL | 90 days supply | Qty: 90 | Fill #1 | Status: AC

## 2023-06-22 NOTE — Telephone Encounter (Signed)
Patient informed of pathology results

## 2023-06-22 NOTE — Telephone Encounter (Signed)
-----   Message from Armida Sans sent at 06/22/2023 11:03 AM EDT ----- FINAL DIAGNOSIS        1. Skin, right forehead :       BASAL CELL CARCINOMA, SUPERFICIAL AND NODULAR PATTERNS   Cancer = BCC Already treated Recheck next visit

## 2023-06-25 ENCOUNTER — Other Ambulatory Visit (HOSPITAL_COMMUNITY): Payer: Self-pay

## 2023-06-25 ENCOUNTER — Other Ambulatory Visit: Payer: Self-pay

## 2023-06-25 MED FILL — Rosuvastatin Calcium Tab 40 MG: ORAL | 90 days supply | Qty: 90 | Fill #2 | Status: AC

## 2023-06-26 ENCOUNTER — Encounter (HOSPITAL_COMMUNITY): Payer: Self-pay

## 2023-06-28 ENCOUNTER — Ambulatory Visit: Payer: Self-pay | Admitting: Emergency Medicine

## 2023-06-28 NOTE — Heart Team MDD (Signed)
Heart Team Multi-Disciplinary Discussion  Patient: Kendra Salas  DOB: 1951-08-22  MRN: [id number]   Date: 06/28/2023  11:42 AM    Attendees: Interventional Cardiology: Alverda Skeans, MD Bryan Lemma, MD Yates Decamp, MD Peter Sw[place], MD Nanetta Batty, MD Lorine Bears, MD Shawnie Pons, MD Verne Carrow, MD Everette Rank, MD  Cardiothoracic Surgery: Brynda Greathouse, MD     Patient History: 72 y.o. adult with a hx of CAD/PCI x 9 (8 stents to LAD and one to D2), hypertension, hyperlipidemia, who endorses shortness of breath with exertion over the past 6 months.  Finds it difficult to walk up a hill or long distances while shopping.  Denies any chest pains today or the past several weeks.  Stress test about a year ago showed a fixed apical defect.        Risk Factors: Hypertension Hyperlipidemia   CAD History: CAD s/p PCI x 9.  History of stable angina, currently with dyspnea on exertion.       Review of Prior Angiography and PCI Procedures:  The R/L heart cath and coronary angiogram from 06/18/2023 images were reviewed and discussed in detail including: moderately to severely calcified coronary arteries with significant two-vessel coronary artery disease.  Multiple overlapped stents noted in the mid to distal LAD with severe in-stent restenosis distally.  First diagonal stent is patent with mild in-stent restenosis.  Proximal RCA stent is patent with mild in-stent restenosis and heavily calcified 70% stenosis in the mid right coronary artery.     Discussion: After presentation, consideration of treatment options occurred including contrasting two-vessel CABG versus drug-coated balloon angioplasty to the LAD for in-stent restenosis and likely intravascular lithotripsy and drug eluting stent placement to the right coronary artery. Relative to referral to CT surgery for CABG, it was decided she would not be a good candidate. After extensive discussion and  consensus from the team, the recommendation is for PCI with drug-coated balloon to LAD with imaging and possibly adding another stent if needed. For the RCA, it was recommended to use intravascular lithotripsy and drug eluting stent placement.    Recommendations: PCI      Alison Murray, RN  06/28/2023 11:42 AM

## 2023-06-30 ENCOUNTER — Telehealth: Payer: Self-pay | Admitting: *Deleted

## 2023-06-30 DIAGNOSIS — I25118 Atherosclerotic heart disease of native coronary artery with other forms of angina pectoris: Secondary | ICD-10-CM

## 2023-06-30 NOTE — Telephone Encounter (Signed)
Patient has been called and made aware of the instructions for the PCI. Message has been sent to MyChart as well. Labs will be completed tomorrow.    You are scheduled for a Cardiac Catheterization on Wednesday, October 2 with Dr. Lorine Bears.  1. Please arrive at the [facility] (Main Entrance A) at [facility]: [address] [place], [place] [address] at 10:30 AM (This time is 2 hour(s) before your procedure to ensure your preparation). Free valet parking service is available. You will check in at ADMITTING. The support person will be asked to wait in the waiting room.  It is OK to have someone drop you off and come back when you are ready to be discharged.    Special note: Every effort is made to have your procedure done on time. Please understand that emergencies sometimes delay scheduled procedures.  2. Diet: Do not eat solid foods after midnight.  The patient may have clear liquids until 5am upon the day of the procedure.  3. Labs: You will need to have blood drawn on 07/01/23. You do not need to be fasting.  4. Medication instructions in preparation for your procedure: Nothing to hold  On the morning of your procedure, take your Aspirin 81 mg and Plavix/Clopidogrel and any morning medicines NOT listed above.  You may use sips of water.  5. Plan to go home the same day, you will only stay overnight if medically necessary. 6. Bring a current list of your medications and current insurance cards. 7. You MUST have a responsible person to drive you home. 8. Someone MUST be with you the first 24 hours after you arrive home or your discharge will be delayed. 9. Please wear clothes that are easy to get on and off and wear slip-on shoes.  Thank you for allowing [place] to care for you!   -- [facility] Cardi[facility]

## 2023-07-01 ENCOUNTER — Other Ambulatory Visit: Payer: Self-pay | Admitting: Internal Medicine

## 2023-07-01 DIAGNOSIS — I25118 Atherosclerotic heart disease of native coronary artery with other forms of angina pectoris: Secondary | ICD-10-CM | POA: Diagnosis not present

## 2023-07-01 MED ORDER — NITROGLYCERIN 0.4 MG SL SUBL
0.4000 mg | SUBLINGUAL_TABLET | SUBLINGUAL | 1 refills | Status: DC | PRN
Start: 2023-07-01 — End: 2023-09-23

## 2023-07-02 LAB — BASIC METABOLIC PANEL
BUN/Creatinine Ratio: 13 (ref 12–28)
BUN: 12 mg/dL (ref 8–27)
CO2: 20 mmol/L (ref 20–29)
Calcium: 9 mg/dL (ref 8.7–10.3)
Chloride: 102 mmol/L (ref 96–106)
Creatinine, Ser: 0.9 mg/dL (ref 0.57–1.00)
Glucose: 97 mg/dL (ref 70–99)
Potassium: 3.9 mmol/L (ref 3.5–5.2)
Sodium: 139 mmol/L (ref 134–144)
eGFR: 68 mL/min/{1.73_m2} (ref >59)

## 2023-07-02 LAB — CBC
Hematocrit: 43.6 % (ref 34.0–46.6)
Hemoglobin: 13.9 g/dL (ref 11.1–15.9)
MCH: 27.5 pg (ref 26.6–33.0)
MCHC: 31.9 g/dL (ref 31.5–35.7)
MCV: 86 fL (ref 79–97)
Platelets: 293 10*3/uL (ref 150–450)
RBC: 5.05 x10E6/uL (ref 3.77–5.28)
RDW: 13.7 % (ref 11.7–15.4)
WBC: 8.7 10*3/uL (ref 3.4–10.8)

## 2023-07-05 ENCOUNTER — Telehealth: Payer: Self-pay | Admitting: *Deleted

## 2023-07-05 NOTE — Telephone Encounter (Signed)
Coronary Stent scheduled at [facility] for: Wednesday July 07, 2023 12:30 PM Arrival time [facility] Main Entrance A at: 10:30 AM  Nothing to eat after midnight prior to procedure, clear liquids until 5 AM day of procedure.  Medication instructions: -Usual morning medications can be taken with sips of water including aspirin 81 mg and Plavix 75 mg.  Plan to go home the same day, you will only stay overnight if medically necessary.  You must have responsible adult to drive you home.  Someone must be with you the first 24 hours after you arrive home.  Reviewed procedure instructions with patient.

## 2023-07-06 ENCOUNTER — Other Ambulatory Visit: Payer: Self-pay

## 2023-07-06 MED ORDER — AMLODIPINE BESYLATE 5 MG PO TABS
5.0000 mg | ORAL_TABLET | Freq: Every day | ORAL | 1 refills | Status: DC
  Filled 2023-07-06: qty 90, 90d supply, fill #0
  Filled 2023-10-02: qty 90, 90d supply, fill #1

## 2023-07-07 ENCOUNTER — Ambulatory Visit (HOSPITAL_COMMUNITY)
Admission: RE | Admit: 2023-07-07 | Discharge: 2023-07-07 | Disposition: A | Discharge status: Home / Self Care | Payer: Commercial Managed Care - PPO | Attending: Cardiovascular Disease | Admitting: Cardiovascular Disease

## 2023-07-07 ENCOUNTER — Ambulatory Visit (HOSPITAL_COMMUNITY)
Admission: RE | Disposition: A | Discharge status: Home / Self Care | Payer: Self-pay | Source: Home / Self Care | Attending: Cardiovascular Disease

## 2023-07-07 ENCOUNTER — Other Ambulatory Visit: Payer: Self-pay

## 2023-07-07 DIAGNOSIS — R079 Chest pain, unspecified: Secondary | ICD-10-CM

## 2023-07-07 DIAGNOSIS — Z955 Presence of coronary angioplasty implant and graft: Secondary | ICD-10-CM | POA: Diagnosis not present

## 2023-07-07 DIAGNOSIS — Z7902 Long term (current) use of antithrombotics/antiplatelets: Secondary | ICD-10-CM | POA: Diagnosis not present

## 2023-07-07 DIAGNOSIS — I10 Essential (primary) hypertension: Secondary | ICD-10-CM | POA: Diagnosis not present

## 2023-07-07 DIAGNOSIS — I2584 Coronary atherosclerosis due to calcified coronary lesion: Secondary | ICD-10-CM | POA: Insufficient documentation

## 2023-07-07 DIAGNOSIS — I25118 Atherosclerotic heart disease of native coronary artery with other forms of angina pectoris: Secondary | ICD-10-CM | POA: Diagnosis not present

## 2023-07-07 DIAGNOSIS — Z79899 Other long term (current) drug therapy: Secondary | ICD-10-CM | POA: Insufficient documentation

## 2023-07-07 DIAGNOSIS — Z7982 Long term (current) use of aspirin: Secondary | ICD-10-CM | POA: Insufficient documentation

## 2023-07-07 DIAGNOSIS — E785 Hyperlipidemia, unspecified: Secondary | ICD-10-CM | POA: Insufficient documentation

## 2023-07-07 DIAGNOSIS — I2510 Atherosclerotic heart disease of native coronary artery without angina pectoris: Secondary | ICD-10-CM | POA: Diagnosis present

## 2023-07-07 DIAGNOSIS — R0609 Other forms of dyspnea: Secondary | ICD-10-CM | POA: Diagnosis not present

## 2023-07-07 HISTORY — PX: CORONARY STENT INTERVENTION: CATH118234

## 2023-07-07 HISTORY — PX: CORONARY IMAGING/OCT: CATH118326

## 2023-07-07 HISTORY — PX: CORONARY LITHOTRIPSY: CATH118330

## 2023-07-07 LAB — POCT ACTIVATED CLOTTING TIME
Activated Clotting Time: 287 s
Activated Clotting Time: 214 s
Activated Clotting Time: 403 s

## 2023-07-07 SURGERY — CORONARY STENT INTERVENTION
Anesthesia: LOCAL

## 2023-07-07 MED ORDER — LIDOCAINE HCL (PF) 1 % IJ SOLN
INTRAMUSCULAR | Status: AC
  Filled 2023-07-07: qty 30

## 2023-07-07 MED ORDER — LIDOCAINE HCL (PF) 1 % IJ SOLN
INTRAMUSCULAR | Status: DC | PRN
  Administered 2023-07-07: 2 mL

## 2023-07-07 MED ORDER — ONDANSETRON HCL 4 MG/2ML IJ SOLN: 4.0000 mg | Freq: Four times a day (QID) | INTRAMUSCULAR | Status: DC | PRN

## 2023-07-07 MED ORDER — MIDAZOLAM HCL 2 MG/2ML IJ SOLN
INTRAMUSCULAR | Status: DC | PRN
  Administered 2023-07-07: 1 mg via INTRAVENOUS

## 2023-07-07 MED ORDER — SODIUM CHLORIDE 0.9 % WEIGHT BASED INFUSION
3.0000 mL/kg/h | INTRAVENOUS | Status: AC
  Administered 2023-07-07: 3 mL/kg/h via INTRAVENOUS

## 2023-07-07 MED ORDER — ACETAMINOPHEN 325 MG PO TABS: 650.0000 mg | ORAL_TABLET | ORAL | Status: DC | PRN

## 2023-07-07 MED ORDER — HEPARIN SODIUM (PORCINE) 1000 UNIT/ML IJ SOLN
INTRAMUSCULAR | Status: DC | PRN
  Administered 2023-07-07: 2000 [IU] via INTRAVENOUS
  Administered 2023-07-07: 8000 [IU] via INTRAVENOUS

## 2023-07-07 MED ORDER — MIDAZOLAM HCL 2 MG/2ML IJ SOLN
INTRAMUSCULAR | Status: AC
  Filled 2023-07-07: qty 2

## 2023-07-07 MED ORDER — CLOPIDOGREL BISULFATE 75 MG PO TABS: 75.0000 mg | ORAL_TABLET | ORAL | Status: DC

## 2023-07-07 MED ORDER — CLOPIDOGREL BISULFATE 300 MG PO TABS
ORAL_TABLET | ORAL | Status: DC | PRN
  Administered 2023-07-07: 300 mg via ORAL

## 2023-07-07 MED ORDER — VERAPAMIL HCL 2.5 MG/ML IV SOLN
INTRAVENOUS | Status: AC
  Filled 2023-07-07: qty 2

## 2023-07-07 MED ORDER — SODIUM CHLORIDE 0.9 % IV SOLN: 250.0000 mL | INTRAVENOUS | Status: DC | PRN

## 2023-07-07 MED ORDER — SODIUM CHLORIDE 0.9 % WEIGHT BASED INFUSION: 1.0000 mL/kg/h | INTRAVENOUS | Status: DC

## 2023-07-07 MED ORDER — CLOPIDOGREL BISULFATE 300 MG PO TABS
ORAL_TABLET | ORAL | Status: AC
  Filled 2023-07-07: qty 1

## 2023-07-07 MED ORDER — SODIUM CHLORIDE 0.9 % IV SOLN: INTRAVENOUS | Status: AC

## 2023-07-07 MED ORDER — SODIUM CHLORIDE 0.9% FLUSH: 3.0000 mL | Freq: Two times a day (BID) | INTRAVENOUS | Status: DC

## 2023-07-07 MED ORDER — NITROGLYCERIN 1 MG/10 ML FOR IR/CATH LAB
INTRA_ARTERIAL | Status: AC
  Filled 2023-07-07: qty 10

## 2023-07-07 MED ORDER — ASPIRIN 81 MG PO CHEW: 81.0000 mg | CHEWABLE_TABLET | ORAL | Status: DC

## 2023-07-07 MED ORDER — FENTANYL CITRATE (PF) 100 MCG/2ML IJ SOLN
INTRAMUSCULAR | Status: AC
  Filled 2023-07-07: qty 2

## 2023-07-07 MED ORDER — HEPARIN SODIUM (PORCINE) 1000 UNIT/ML IJ SOLN
INTRAMUSCULAR | Status: AC
  Filled 2023-07-07: qty 10

## 2023-07-07 MED ORDER — FENTANYL CITRATE (PF) 100 MCG/2ML IJ SOLN
INTRAMUSCULAR | Status: DC | PRN
  Administered 2023-07-07: 25 ug via INTRAVENOUS

## 2023-07-07 MED ORDER — VERAPAMIL HCL 2.5 MG/ML IV SOLN
INTRAVENOUS | Status: DC | PRN
  Administered 2023-07-07: 10 mL via INTRA_ARTERIAL

## 2023-07-07 MED ORDER — IOHEXOL 350 MG/ML SOLN
INTRAVENOUS | Status: DC | PRN
  Administered 2023-07-07: 105 mL via INTRA_ARTERIAL

## 2023-07-07 MED ORDER — SODIUM CHLORIDE 0.9% FLUSH: 3.0000 mL | INTRAVENOUS | Status: DC | PRN

## 2023-07-07 MED ORDER — HEPARIN (PORCINE) IN NACL 1000-0.9 UT/500ML-% IV SOLN
INTRAVENOUS | Status: DC | PRN
  Administered 2023-07-07: 1000 mL via INTRA_ARTERIAL

## 2023-07-07 SURGICAL SUPPLY — 21 items
BALLN ~~LOC~~ EMERGE MR 3.25X15 (BALLOONS) ×1
BALLN ~~LOC~~ EMERGE MR 4.0X12 (BALLOONS) ×1
BALLOON ~~LOC~~ EMERGE MR 3.25X15 (BALLOONS) IMPLANT
BALLOON ~~LOC~~ EMERGE MR 4.0X12 (BALLOONS) IMPLANT
CATH DRAGONFLY OPSTAR (CATHETERS) IMPLANT
CATH LAUNCHER 6FR AL.75 (CATHETERS) IMPLANT
CATH LAUNCHER 6FR JR4 (CATHETERS) IMPLANT
CATH SHOCKWAVE C2 3.0X12 (CATHETERS) IMPLANT
DEVICE RAD COMP TR BAND LRG (VASCULAR PRODUCTS) IMPLANT
ELECT DEFIB PAD ADLT CADENCE (PAD) IMPLANT
GLIDESHEATH SLEND SS 6F .021 (SHEATH) IMPLANT
GUIDEWIRE INQWIRE 1.5J.035X260 (WIRE) IMPLANT
INQWIRE 1.5J .035X260CM (WIRE) ×1
KIT ENCORE 26 ADVANTAGE (KITS) IMPLANT
KIT HEMO VALVE WATCHDOG (MISCELLANEOUS) IMPLANT
PACK CARDIAC CATHETERIZATION (CUSTOM PROCEDURE TRAY) ×1 IMPLANT
SET ATX-X65L (MISCELLANEOUS) IMPLANT
STENT SYNERGY XD 3.50X20 (Permanent Stent) IMPLANT
SYNERGY XD 3.50X20 (Permanent Stent) ×1 IMPLANT
TUBING CIL FLEX 10 FLL-RA (TUBING) IMPLANT
WIRE RUNTHROUGH .014X180CM (WIRE) IMPLANT

## 2023-07-07 NOTE — Interval H&P Note (Signed)
History and Physical Interval Note:  07/07/2023 1:07 PM  Kendra Salas  has presented today for surgery, with the diagnosis of chest pain.  The various methods of treatment have been discussed with the patient and family. After consideration of risks, benefits and other options for treatment, the patient has consented to  Procedure(s): CORONARY STENT INTERVENTION (N/A) as a surgical intervention.  The patient's history has been reviewed, patient examined, no change in status, stable for surgery.  I have reviewed the patient's chart and labs.  Questions were answered to the patient's satisfaction.     Lorine Bears

## 2023-07-07 NOTE — Interval H&P Note (Signed)
Cath Lab Visit (complete for each Cath Lab visit)  Clinical Evaluation Leading to the Procedure:   ACS: No.  Non-ACS:    Anginal Classification: CCS III  Anti-ischemic medical therapy: Maximal Therapy (2 or more classes of medications)  Non-Invasive Test Results: No non-invasive testing performed  Prior CABG: No previous CABG      History and Physical Interval Note:  07/07/2023 1:08 PM  Kendra Salas  has presented today for surgery, with the diagnosis of chest pain.  The various methods of treatment have been discussed with the patient and family. After consideration of risks, benefits and other options for treatment, the patient has consented to  Procedure(s): CORONARY STENT INTERVENTION (N/A) as a surgical intervention.  The patient's history has been reviewed, patient examined, no change in status, stable for surgery.  I have reviewed the patient's chart and labs.  Questions were answered to the patient's satisfaction.     Lorine Bears

## 2023-07-07 NOTE — Discharge Instructions (Signed)

## 2023-07-07 NOTE — Progress Notes (Signed)
TR band removed at 1700, gauze dressing applied. Right radial site level 0, clean, dry and intact. Patient walked to the bathroom without difficulties.

## 2023-07-07 NOTE — Discharge Summary (Signed)
Discharge Summary for Same Day PCI   Patient ID: Kendra Salas MRN: [id number]; DOB: Mar 24, 1951  Admit date: 07/07/2023 Discharge date: 07/07/2023  Primary Care Provider: Reubin Milan, MD  Primary Cardiologist: Debbe Odea, MD  Primary Electrophysiologist:  None   Discharge Diagnoses    Principal Problem:   Chest pain Active Problems:   CAD (coronary artery disease)    Diagnostic Studies/Procedures    Cardiac Catheterization 07/07/2023:    Prox RCA lesion is 30% stenosed.   Mid RCA to Dist RCA lesion is 30% stenosed.   1st RPL lesion is 60% stenosed.   Mid RCA lesion is 80% stenosed.   A drug-eluting stent was successfully placed using a SYNERGY XD 3.50X20.   Post intervention, there is a 0% residual stenosis.   Successful OCT guided intravascular lithotripsy and drug-eluting stent placement to the mid right coronary artery.   Recommendations: Continue indefinite dual antiplatelet therapy as tolerated. Staged LAD PCI with drug-coated balloon is recommended for in-stent restenosis.  Diagnostic Dominance: Right  Intervention   _____________   History of Present Illness     Kendra Salas is a 72 y.o. adult with with a hx of CAD/PCI x 9 (8 stents to LAD and  one to D2), hypertension, hyperlipidemia, who follows with Dr. Azucena Cecil as an outpatient.    He was seen in the office and reported shortness of breath with exertion over the past 6 months.  Finds it difficult to walk up a hill or long distances while shopping.  Denies any chest pains today on the past several weeks.  Stress test about a year ago showed a fixed apical defect.  Left heart cath about 2 years ago showed patent LAD stents.   Prior notes/studies Left heart cath 03/2021 s/p PCI of D2, prior LAD stents mild ISR. Lexiscan Myoview 10/2021 fixed apical defect consistent with prior infarct.  Given dyspnea on exertion he set up for outpatient cardiac cath.   Hospital Course     The  patient underwent cardiac cath as noted above with 80% mRCA lesion treated with PCI/DES with intravascular litho. Plan for DAPT with ASA/plavix indefinitely. The patient was seen by cardiac rehab while in short stay. There were no observed complications post cath. Radial cath site was re-evaluated prior to discharge and found to be stable without any complications. Instructions/precautions regarding cath site care were given prior to discharge.  Kendra Salas was seen by Dr. Kirke Corin and determined stable for discharge home. Follow up with our office has been arranged. Medications are listed below. Pertinent changes include n/a.  _____________  Cath/PCI Registry Performance & Quality Measures: Aspirin prescribed? - Yes ADP Receptor Inhibitor (Plavix/Clopidogrel, Brilinta/Ticagrelor or Effient/Prasugrel) prescribed (includes medically managed patients)? - Yes High Intensity Statin (Lipitor 40-80mg  or Crestor 20-40mg ) prescribed? - Yes For EF <40%, was ACEI/ARB prescribed? - Not Applicable (EF >/= 40%) For EF <40%, Aldosterone Antagonist (Spironolactone or Eplerenone) prescribed? - Not Applicable (EF >/= 40%) Cardiac Rehab Phase II ordered (Included Medically managed Patients)? - Yes  _____________   Discharge Vitals Blood pressure 135/88, pulse 82, temperature 98 F (36.7 C), temperature source Oral, resp. rate (!) 9, height 5\' 11"  (1.803 m), weight 87.1 kg, SpO2 94%.  Filed Weights   07/07/23 1023  Weight: 87.1 kg    Last Labs & Radiologic Studies    CBC No results for input(s): "WBC", "NEUTROABS", "HGB", "HCT", "MCV", "PLT" in the last 72 hours. Basic Metabolic Panel No results for input(s): "NA", "K", "  CL", "CO2", "GLUCOSE", "BUN", "CREATININE", "CALCIUM", "MG", "PHOS" in the last 72 hours. Liver Function Tests No results for input(s): "AST", "ALT", "ALKPHOS", "BILITOT", "PROT", "ALBUMIN" in the last 72 hours. No results for input(s): "LIPASE", "AMYLASE" in the last 72 hours. High  Sensitivity Troponin:   No results for input(s): "TROPONINIHS" in the last 720 hours.  BNP Invalid input(s): "POCBNP" D-Dimer No results for input(s): "DDIMER" in the last 72 hours. Hemoglobin A1C No results for input(s): "HGBA1C" in the last 72 hours. Fasting Lipid Panel No results for input(s): "CHOL", "HDL", "LDLCALC", "TRIG", "CHOLHDL", "LDLDIRECT" in the last 72 hours. Thyroid Function Tests No results for input(s): "TSH", "T4TOTAL", "T3FREE", "THYROIDAB" in the last 72 hours.  Invalid input(s): "FREET3" _____________  CARDIAC CATHETERIZATION  Result Date: 07/07/2023   Prox RCA lesion is 30% stenosed.   Mid RCA to Dist RCA lesion is 30% stenosed.   1st RPL lesion is 60% stenosed.   Mid RCA lesion is 80% stenosed.   A drug-eluting stent was successfully placed using a SYNERGY XD 3.50X20.   Post intervention, there is a 0% residual stenosis. Successful OCT guided intravascular lithotripsy and drug-eluting stent placement to the mid right coronary artery. Recommendations: Continue indefinite dual antiplatelet therapy as tolerated. Staged LAD PCI with drug-coated balloon is recommended for in-stent restenosis.   CARDIAC CATHETERIZATION  Result Date: 06/18/2023   Prox RCA lesion is 30% stenosed.   Mid RCA lesion is 70% stenosed.   Mid RCA to Dist RCA lesion is 30% stenosed.   Prox LAD lesion is 30% stenosed.   1st Diag lesion is 10% stenosed.   Mid LAD lesion is 50% stenosed.   Mid LAD to Dist LAD lesion is 90% stenosed.   2nd Sept lesion is 100% stenosed.   1st RPL lesion is 60% stenosed. 1.  Moderately to severely calcified coronary arteries with significant two-vessel coronary artery disease.  Multiple overlapped stents noted in the mid to distal LAD with severe in-stent restenosis distally.  First diagonal stent is patent with mild in-stent restenosis.  Proximal RCA stent is patent with mild in-stent restenosis.  There is heavily calcified 70% stenosis in the mid right coronary artery 2.   Left ventricular angiography was not performed.  EF was low normal by echo. 3.  Right heart catheterization showed normal filling pressures, normal pulmonary pressure and normal cardiac output. Recommendations: Will discuss revascularization option with the patient and also during the heart team meeting.  Options include two-vessel CABG versus drug-coated balloon angioplasty to the LAD for in-stent restenosis and likely intravascular lithotripsy and drug eluting stent placement to the right coronary artery.    Disposition   Pt is being discharged home today in good condition.  Follow-up Plans & Appointments     Follow-up Information     Debbe Odea, MD Follow up on 07/28/2023.   Specialties: Cardiology, Radiology Why: at 8:20am for your follow up with cardiology Contact information: [address] [place] [address] [phone]                Discharge Instructions     AMB Referral to Cardiac Rehabilitation - Phase II   Complete by: As directed    Diagnosis:  Stable Angina Coronary Stents     After initial evaluation and assessments completed: Virtual Based Care may be provided alone or in conjunction with Phase 2 Cardiac Rehab based on patient barriers.: Yes   Intensive Cardiac Rehabilitation (ICR) [facility] location only OR Traditional Cardiac Rehabilitation (TCR) *If criteria  for ICR are not met will enroll in TCR [facility] only): Yes      Discharge Medications   Allergies as of 07/07/2023   No Known Allergies      Medication List     STOP taking these medications    sulfacetamide 10 % ophthalmic solution Commonly known as: Bleph-10       TAKE these medications    amLODipine 5 MG tablet Commonly known as: NORVASC Take 1 tablet (5 mg total) by mouth daily.   aspirin 81 MG tablet Take 81 mg by mouth daily.   clopidogrel 75 MG tablet Commonly known as: PLAVIX Take 1 tablet (75 mg total) by mouth daily. OFFICE VISIT NEEDED FOR ADDITIONAL  REFILLS   Dupixent 300 MG/2ML prefilled syringe Generic drug: dupilumab INJECT 1 SYRINGE UNDER THE SKIN AS DIRECTED EVERY OTHER WEEK   estradiol 2 MG tablet Commonly known as: ESTRACE Take 1 tablet (2 mg total) by mouth daily.   famotidine 20 MG tablet Commonly known as: PEPCID Take 20 mg by mouth daily as needed for heartburn (Breakthrough).   finasteride 5 MG tablet Commonly known as: PROSCAR Take 1 tablet (5 mg total) by mouth daily.   hyoscyamine 0.125 MG Tbdp disintergrating tablet Commonly known as: ANASPAZ Place 1 tablet (0.125 mg total) under the tongue every 6 (six) hours as needed.   isosorbide mononitrate 30 MG 24 hr tablet Commonly known as: IMDUR Take 1 tablet (30 mg total) by mouth daily.   meclizine 25 MG tablet Commonly known as: ANTIVERT Take 1 tablet (25 mg total) by mouth 3 (three) times daily as needed for dizziness.   metoprolol succinate 25 MG 24 hr tablet Commonly known as: Toprol XL Take 1 tablet (25 mg total) by mouth daily.   mupirocin ointment 2 % Commonly known as: BACTROBAN Apply 1 Application topically 2 (two) times daily. What changed:  when to take this reasons to take this   nitroGLYCERIN 0.4 MG SL tablet Commonly known as: NITROSTAT Place 1 tablet (0.4 mg total) under the tongue every 5 (five) minutes as needed for chest pain.   nystatin cream Commonly known as: MYCOSTATIN Apply to the affected area(s) 2 (two) times daily. What changed:  when to take this reasons to take this   ondansetron 4 MG tablet Commonly known as: Zofran Take 1 tablet (4 mg total) by mouth every 8 (eight) hours as needed for nausea or vomiting.   Opzelura 1.5 % Crea Generic drug: Ruxolitinib Phosphate Apply 1 Application topically as directed. Qd to aa eczema   pantoprazole 40 MG tablet Commonly known as: PROTONIX Take 1 tablet (40 mg total) by mouth daily.   rosuvastatin 40 MG tablet Commonly known as: CRESTOR Take 1 tablet (40 mg total) by  mouth daily.   triamcinolone cream 0.1 % Commonly known as: KENALOG Apply 1 application topically 2 (two) times daily. To rash on legs What changed:  when to take this reasons to take this additional instructions   valsartan 160 MG tablet Commonly known as: DIOVAN Take 1 tablet (160 mg total) by mouth daily.        Allergies No Known Allergies  Outstanding Labs/Studies   N/a   Duration of Discharge Encounter   Greater than 30 minutes including physician time.  Signed, Laverda Page, NP 07/07/2023, 3:30 PM

## 2023-07-07 NOTE — Progress Notes (Signed)
Discussed with pt and daughter stent, restrictions, Plavix, diet, exercise, NTG and CRPII. Receptive. Prefers to walk at [facility] [address] but will refer to [facility] CRPII to meet protocol.  [id number] Ethelda Chick BS, ACSM-CEP 07/07/2023 3:36 PM

## 2023-07-08 ENCOUNTER — Encounter (HOSPITAL_COMMUNITY): Payer: Self-pay | Admitting: Cardiovascular Disease

## 2023-07-08 MED FILL — Nitroglycerin IV Soln 100 MCG/ML in D5W: INTRA_ARTERIAL | Qty: 10 | Status: AC

## 2023-07-15 ENCOUNTER — Other Ambulatory Visit: Payer: Self-pay

## 2023-07-15 ENCOUNTER — Encounter (HOSPITAL_COMMUNITY): Payer: Self-pay

## 2023-07-15 NOTE — Progress Notes (Signed)
Specialty Pharmacy Ongoing Clinical Assessment Note  Kendra Salas is a 72 y.o. adult who is being followed by the specialty pharmacy service for RxSp Atopic Dermatitis   Patient's specialty medication(s) reviewed today: Dupilumab   Missed doses in the last 4 weeks: 0   Patient/Caregiver did not have any additional questions or concerns.   Therapeutic benefit summary: Patient is achieving benefit   Adverse events/side effects summary: No adverse events/side effects   Patient's therapy is appropriate to: Continue    Goals Addressed             This Visit's Progress    Reduce signs and symptoms       Patient is on track. Patient will maintain adherence         Follow up:  6 months  Otto Herb Specialty Pharmacist

## 2023-07-15 NOTE — Progress Notes (Signed)
Specialty Pharmacy Refill Coordination Note  Kendra Salas is a 72 y.o. adult contacted today regarding refills of specialty medication(s) Dupilumab   Patient requested Courier to Provider Office   Delivery date: 07/22/23   Verified address: [facility] [address] [place], [place] [address]   Medication will be filled on 07/21/23.

## 2023-07-23 ENCOUNTER — Other Ambulatory Visit: Payer: Self-pay

## 2023-07-28 ENCOUNTER — Other Ambulatory Visit: Payer: Self-pay | Admitting: Dermatology

## 2023-07-28 ENCOUNTER — Other Ambulatory Visit (HOSPITAL_COMMUNITY): Payer: Self-pay

## 2023-07-28 ENCOUNTER — Other Ambulatory Visit: Payer: Self-pay

## 2023-07-28 ENCOUNTER — Ambulatory Visit: Payer: Commercial Managed Care - PPO | Attending: Cardiology | Admitting: Cardiology

## 2023-07-28 ENCOUNTER — Encounter: Payer: Self-pay | Admitting: Cardiology

## 2023-07-28 ENCOUNTER — Other Ambulatory Visit: Payer: Self-pay | Admitting: Pharmacist

## 2023-07-28 VITALS — BP 104/72 | HR 65 | Ht 71.0 in | Wt 179.6 lb

## 2023-07-28 DIAGNOSIS — I10 Essential (primary) hypertension: Secondary | ICD-10-CM

## 2023-07-28 DIAGNOSIS — I25118 Atherosclerotic heart disease of native coronary artery with other forms of angina pectoris: Secondary | ICD-10-CM

## 2023-07-28 MED ORDER — DUPIXENT 300 MG/2ML ~~LOC~~ SOSY
PREFILLED_SYRINGE | SUBCUTANEOUS | 6 refills | Status: DC
  Filled 2023-07-28: qty 4, fill #0
  Filled 2023-08-30: qty 4, 28d supply, fill #0
  Filled 2023-10-05: qty 4, 28d supply, fill #1
  Filled 2023-11-09: qty 4, 28d supply, fill #2

## 2023-07-28 MED FILL — Dupilumab Subcutaneous Soln Prefilled Syringe 300 MG/2ML: SUBCUTANEOUS | Qty: 4 | Fill #0 | Status: CN

## 2023-07-28 NOTE — Patient Instructions (Signed)

## 2023-07-28 NOTE — Progress Notes (Signed)
Cardiology Office Note:    Date:  07/28/2023   ID:  Kendra, Salas 09/04/1951, MRN [id number]  PCP:  Reubin Milan, MD   [facility] Providers Cardiologist:  Debbe Odea, MD     Referring MD: Reubin Milan, MD   Chief Complaint  Patient presents with   Follow-up    Patient denies new or acute cardiac problems/concerns today.      History of Present Illness:    Kendra Salas is a 72 y.o. adult with a hx of CAD/PCI x 10 (7 stents to LAD and two to RCA, 1 to diagonal), 80% mid RCA stenosis s/p drug-eluting stent 10/24, hypertension, hyperlipidemia, who presents for follow-up.  Patient presents after recent stent placement due to dyspnea and angina symptoms.  Overall she endorsed significant improvement, able to now walk briskly without symptoms of chest pain or shortness of breath.  She is hoping intervention to her mid to distal LAD could be pursued before end of year.  States being very happy with outcome for stent.  Tolerating Toprol-XL, Imdur with no adverse effects.  Prior notes/studies Left heart cath 06/2023 90% in-stent restenosis mid to distal LAD, overlapping stents in mid LAD 50% in-stent restenosis, proximal RCA stent 30% in-stent stenosis, diagonal stent 10% stenosis, 70% mid RCA stenosis. Left heart cath 03/2021 s/p PCI of D2, prior LAD stents mild ISR. Lexiscan Myoview 10/2021 fixed apical defect consistent with prior infarct.  Past Medical History:  Diagnosis Date   Anxiety    Basal cell carcinoma 03/07/2008   left dorsum mid forearm   Basal cell carcinoma 04/19/2013   left distal lat ant thigh   Basal cell carcinoma 04/11/2014   right forearm, right post shoulder near deltoid   Basal cell carcinoma 10/12/2018   left mid back 7.0 cm lat to spine   Basal cell carcinoma 06/16/2023   R forehead, ED&C   CAD (coronary artery disease)    Clotting disorder (HCC)    Dysplastic nevus 02/02/2007   left volar mid to prox forearm   Dysplastic  nevus 07/09/2010   right medial breast 1.5 cm med to areola, right med breast parasternal, right post distal deltoid   Eczema    GERD (gastroesophageal reflux disease)    Hyperlipemia    Hypertension    Lymphoma (HCC) 2015   Melanoma in situ (HCC) 12/22/2006   right medial breast, med sup quad   Personal history of chemotherapy 2017   6 months of chemo    Past Surgical History:  Procedure Laterality Date   AUGMENTATION MAMMAPLASTY Bilateral 2003   CARDIAC CATHETERIZATION     X3 - has 9 stents. 4 LAD, 1 posterior, Most recent cath 03/2021   COLONOSCOPY  06/29/2012   repeat 10 yrs - Dr. Bluford Kaufmann   COLONOSCOPY WITH PROPOFOL N/A 08/07/2022   Procedure: COLONOSCOPY WITH PROPOFOL;  Surgeon: Midge Minium, MD;  Location: [facility];  Service: Endoscopy;  Laterality: N/A;  needs to be first case   CORONARY IMAGING/OCT N/A 07/07/2023   Procedure: CORONARY IMAGING/OCT;  Surgeon: Iran Ouch, MD;  Location: [facility];  Service: Cardiovascular;  Laterality: N/A;   CORONARY LITHOTRIPSY N/A 07/07/2023   Procedure: CORONARY LITHOTRIPSY;  Surgeon: Iran Ouch, MD;  Location: [facility];  Service: Cardiovascular;  Laterality: N/A;   CORONARY STENT INTERVENTION N/A 07/07/2023   Procedure: CORONARY STENT INTERVENTION;  Surgeon: Iran Ouch, MD;  Location: [facility];  Service: Cardiovascular;  Laterality: N/A;   ESOPHAGOGASTRODUODENOSCOPY (EGD) WITH PROPOFOL N/A 11/22/2015   Procedure: ESOPHAGOGASTRODUODENOSCOPY (EGD) WITH PROPOFOL;  Surgeon: Midge Minium, MD;  Location: [facility];  Service: Endoscopy;  Laterality: N/A;   FACIAL COSMETIC SURGERY     gender re-affirmation     HERNIA REPAIR     x2   POLYPECTOMY  08/07/2022   Procedure: POLYPECTOMY;  Surgeon: Midge Minium, MD;  Location: [facility];  Service: Endoscopy;;   PORTA CATH REMOVAL N/A 04/10/2019   Procedure: PORTA CATH REMOVAL;  Surgeon: Annice Needy, MD;  Location: [facility] INV[facility] CV  LAB;  Service: Cardiovascular;  Laterality: N/A;   RIGHT/LEFT HEART CATH AND CORONARY ANGIOGRAPHY Bilateral 06/18/2023   Procedure: RIGHT/LEFT HEART CATH AND CORONARY ANGIOGRAPHY;  Surgeon: Iran Ouch, MD;  Location: [facility] INVAS[facility] CV [facility];  Service: Cardiovascular;  Laterality: Bilateral;    Current Medications: Current Meds  Medication Sig   amLODipine (NORVASC) 5 MG tablet Take 1 tablet (5 mg total) by mouth daily.   aspirin 81 MG tablet Take 81 mg by mouth daily.   clopidogrel (PLAVIX) 75 MG tablet Take 1 tablet (75 mg total) by mouth daily. OFFICE VISIT NEEDED FOR ADDITIONAL REFILLS   dupilumab (DUPIXENT) 300 MG/2ML prefilled syringe INJECT 1 SYRINGE UNDER THE SKIN AS DIRECTED EVERY OTHER WEEK   estradiol (ESTRACE) 2 MG tablet Take 1 tablet (2 mg total) by mouth daily.   famotidine (PEPCID) 20 MG tablet Take 20 mg by mouth daily as needed for heartburn (Breakthrough).   finasteride (PROSCAR) 5 MG tablet Take 1 tablet (5 mg total) by mouth daily.   hyoscyamine (ANASPAZ) 0.125 MG TBDP disintergrating tablet Place 1 tablet (0.125 mg total) under the tongue every 6 (six) hours as needed.   isosorbide mononitrate (IMDUR) 30 MG 24 hr tablet Take 1 tablet (30 mg total) by mouth daily.   meclizine (ANTIVERT) 25 MG tablet Take 1 tablet (25 mg total) by mouth 3 (three) times daily as needed for dizziness.   metoprolol succinate (TOPROL XL) 25 MG 24 hr tablet Take 1 tablet (25 mg total) by mouth daily.   mupirocin ointment (BACTROBAN) 2 % Apply 1 Application topically 2 (two) times daily. (Patient taking differently: Apply 1 Application topically daily as needed (Eczema).)   nitroGLYCERIN (NITROSTAT) 0.4 MG SL tablet Place 1 tablet (0.4 mg total) under the tongue every 5 (five) minutes as needed for chest pain.   nystatin cream (MYCOSTATIN) Apply to the affected area(s) 2 (two) times daily. (Patient taking differently: Apply 1 Application topically daily as needed (yeast infeaction).)    ondansetron (ZOFRAN) 4 MG tablet Take 1 tablet (4 mg total) by mouth every 8 (eight) hours as needed for nausea or vomiting.   pantoprazole (PROTONIX) 40 MG tablet Take 1 tablet (40 mg total) by mouth daily.   rosuvastatin (CRESTOR) 40 MG tablet Take 1 tablet (40 mg total) by mouth daily.   Ruxolitinib Phosphate (OPZELURA) 1.5 % CREA Apply 1 Application topically as directed. Qd to aa eczema   triamcinolone cream (KENALOG) 0.1 % Apply 1 application topically 2 (two) times daily. To rash on legs (Patient taking differently: Apply 1 application  topically daily as needed (Eczema).)   valsartan (DIOVAN) 160 MG tablet Take 1 tablet (160 mg total) by mouth daily.     Allergies:   Patient has no known allergies.   Social History   Socioeconomic History   Marital status: Married    Spouse name: Not on file   Number of children:  Not on file   Years of education: Not on file   Highest education level: Not on file  Occupational History   Not on file  Tobacco Use   Smoking status: Never   Smokeless tobacco: Never  Vaping Use   Vaping status: Never Used  Substance and Sexual Activity   Alcohol use: Yes    Alcohol/week: 2.0 standard drinks of alcohol    Types: 2 Shots of liquor per week    Comment: 1 Margarita - Wed and Sat   Drug use: Never   Sexual activity: Not on file  Other Topics Concern   Not on file  Social History Narrative   Not on file   Social Determinants of Health   Financial Resource Strain: Low Risk  (04/10/2019)   Overall Financial Resource Strain (CARDIA)    Difficulty of Paying Living Expenses: Not hard at all  Food Insecurity: No Food Insecurity (02/03/2022)   Hunger Vital Sign    Worried About Running Out of Food in the Last Year: Never true    Ran Out of Food in the Last Year: Never true  Transportation Needs: No Transportation Needs (02/03/2022)   PRAPARE - Administrator, Civil Service (Medical): No    Lack of Transportation (Non-Medical): No   Physical Activity: Insufficiently Active (02/03/2022)   Exercise Vital Sign    Days of Exercise per Week: 2 days    Minutes of Exercise per Session: 60 min  Stress: Stress Concern Present (02/03/2022)   [employer] of Occupational Health - Occupational Stress Questionnaire    Feeling of Stress : Very much  Social Connections: Moderately Integrated (02/03/2022)   Social Connection and Isolation Panel [NHANES]    Frequency of Communication with Friends and Family: More than three times a week    Frequency of Social Gatherings with Friends and Family: More than three times a week    Attends Religious Services: 1 to 4 times per year    Active Member of [employer] or Organizations: No    Attends Banker Meetings: Never    Marital Status: Married     Family History: The patient's family history includes Breast cancer (age of onset: 66) in her maternal aunt; Heart attack in her mother.  ROS:   Please see the history of present illness.     All other systems reviewed and are negative.  EKGs/Labs/Other Studies Reviewed:    The following studies were reviewed today:  EKG Interpretation Date/Time:  Wednesday July 28 2023 08:19:45 EDT Ventricular Rate:  65 PR Interval:  172 QRS Duration:  92 QT Interval:  412 QTC Calculation: 428 R Axis:   -13  Text Interpretation: Normal sinus rhythm Low voltage QRS Confirmed by Debbe Odea ([id number]) on 07/28/2023 8:24:47 AM    Recent Labs: 12/22/2022: ALT 16; TSH 1.68 07/01/2023: BUN 12; Creatinine, Ser 0.90; Hemoglobin 13.9; Platelets 293; Potassium 3.9; Sodium 139  Recent Lipid Panel    Component Value Date/Time   CHOL 116 12/22/2022 0000   CHOL 125 05/04/2019 0803   TRIG 92 12/22/2022 0000   HDL 47 12/22/2022 0000   HDL 39 (L) 05/04/2019 0803   CHOLHDL 3.2 05/04/2019 0803   LDLCALC 51 12/22/2022 0000   LDLCALC 66 05/04/2019 0803     Risk Assessment/Calculations:             Physical Exam:    VS:  BP 104/72 (BP  Location: Left Arm, Patient Position: Sitting, Cuff Size: Normal)   Pulse  65   Ht 5\' 11"  (1.803 m)   Wt 179 lb 9.6 oz (81.5 kg)   SpO2 95%   BMI 25.05 kg/m     Wt Readings from Last 3 Encounters:  07/28/23 179 lb 9.6 oz (81.5 kg)  07/07/23 192 lb (87.1 kg)  06/18/23 195 lb (88.5 kg)     GEN:  Well nourished, well developed in no acute distress HEENT: Normal NECK: No JVD; No carotid bruits CARDIAC: RRR, no murmurs, rubs, gallops RESPIRATORY:  Clear to auscultation without rales, wheezing or rhonchi  ABDOMEN: Soft, non-tender, non-distended MUSCULOSKELETAL:  No edema; No deformity  SKIN: Warm and dry NEUROLOGIC:  Alert and oriented x 3 PSYCHIATRIC:  Normal affect   ASSESSMENT:    1. Coronary artery disease of native artery of native heart with stable angina pectoris (HCC)   2. Primary hypertension    PLAN:    In order of problems listed above:  CAD s/p PCI x 10 total.  80% mid RCA s/p PCI to mid RCA 10/24.  Symptoms of dyspnea and chest pain much improved after recent RCA stent placement.  Continue Toprol-XL 25 mg daily,  Imdur 30 mg daily for antianginal benefit.  Continue aspirin, Plavix, Crestor 40 mg daily.  LDL at goal..  EF 50-55%. Hypertension, BP controlled, continue valsartan 160 mg daily, Toprol-XL 25 mg daily   Follow-up in 6 months          Medication Adjustments/Labs and Tests Ordered: Current medicines are reviewed at length with the patient today.  Concerns regarding medicines are outlined above.  Orders Placed This Encounter  Procedures   EKG 12-Lead   No orders of the defined types were placed in this encounter.   Patient Instructions  Medication Instructions:   Your physician recommends that you continue on your current medications as directed. Please refer to the Current Medication list given to you today.  *If you need a refill on your cardiac medications before your next appointment, please call your pharmacy*   Lab Work:  None  Ordered  If you have labs (blood work) drawn today and your tests are completely normal, you will receive your results only by: MyChart Message (if you have MyChart) OR A paper copy in the mail If you have any lab test that is abnormal or we need to change your treatment, we will call you to review the results.   Testing/Procedures:  None Ordered   Follow-Up: At [facility], you and your health needs are our priority.  As part of our continuing mission to provide you with exceptional heart care, we have created designated Provider Care Teams.  These Care Teams include your primary Cardiologist (physician) and Advanced Practice Providers (APPs -  Physician Assistants and Nurse Practitioners) who all work together to provide you with the care you need, when you need it.  We recommend signing up for the patient portal called "MyChart".  Sign up information is provided on this After Visit Summary.  MyChart is used to connect with patients for Virtual Visits (Telemedicine).  Patients are able to view lab/test results, encounter notes, upcoming appointments, etc.  Non-urgent messages can be sent to your provider as well.   To learn more about what you can do with MyChart, go to [email or web address].    Your next appointment:   6 month(s)  Provider:   You may see Debbe Odea, MD or one of the following Advanced Practice Providers on your designated Care Team:   Nicolasa Ducking, NP  Eula Listen, PA-C Cadence Furth, PA-C Charlsie Quest, NP   Signed, Debbe Odea, MD  07/28/2023 8:42 AM     [facility]

## 2023-07-29 ENCOUNTER — Other Ambulatory Visit: Payer: Self-pay

## 2023-08-10 ENCOUNTER — Other Ambulatory Visit (HOSPITAL_COMMUNITY): Payer: Self-pay

## 2023-08-11 ENCOUNTER — Other Ambulatory Visit: Payer: Self-pay

## 2023-08-20 ENCOUNTER — Telehealth: Payer: Self-pay | Admitting: Cardiovascular Disease

## 2023-08-20 NOTE — Telephone Encounter (Signed)
New Message"       Patient would like for Dr Kirke Corin to call her. This is concerning her next upcoming procedure that she needs.

## 2023-08-23 ENCOUNTER — Other Ambulatory Visit: Payer: Self-pay | Admitting: *Deleted

## 2023-08-23 DIAGNOSIS — C8334 Diffuse large B-cell lymphoma, lymph nodes of axilla and upper limb: Secondary | ICD-10-CM

## 2023-08-23 DIAGNOSIS — R1011 Right upper quadrant pain: Secondary | ICD-10-CM

## 2023-08-23 DIAGNOSIS — R634 Abnormal weight loss: Secondary | ICD-10-CM

## 2023-08-24 ENCOUNTER — Other Ambulatory Visit (HOSPITAL_COMMUNITY): Payer: Self-pay

## 2023-08-24 NOTE — Progress Notes (Signed)
Pharmacy Patient Advocate Encounter   Received notification from CoverMyMeds that prior authorization for Dupixent is required/requested. Current PA expires on 11/28   Insurance verification completed.   The patient is insured through [facility] .   Per test claim: PA required; PA submitted to above mentioned insurance via CoverMyMeds Key/confirmation #/EOC [id number] Status is pending

## 2023-08-25 ENCOUNTER — Ambulatory Visit
Admission: RE | Admit: 2023-08-25 | Discharge: 2023-08-25 | Disposition: A | Discharge status: Home / Self Care | Payer: Commercial Managed Care - PPO | Source: Ambulatory Visit | Attending: Oncology | Admitting: Oncology

## 2023-08-25 DIAGNOSIS — C8330 Diffuse large B-cell lymphoma, unspecified site: Secondary | ICD-10-CM | POA: Diagnosis not present

## 2023-08-25 DIAGNOSIS — C8334 Diffuse large B-cell lymphoma, lymph nodes of axilla and upper limb: Secondary | ICD-10-CM | POA: Diagnosis not present

## 2023-08-25 DIAGNOSIS — R1011 Right upper quadrant pain: Secondary | ICD-10-CM | POA: Diagnosis not present

## 2023-08-25 DIAGNOSIS — R634 Abnormal weight loss: Secondary | ICD-10-CM | POA: Diagnosis not present

## 2023-08-25 DIAGNOSIS — J439 Emphysema, unspecified: Secondary | ICD-10-CM | POA: Diagnosis not present

## 2023-08-25 DIAGNOSIS — K449 Diaphragmatic hernia without obstruction or gangrene: Secondary | ICD-10-CM | POA: Diagnosis not present

## 2023-08-25 DIAGNOSIS — I700 Atherosclerosis of aorta: Secondary | ICD-10-CM | POA: Diagnosis not present

## 2023-08-25 MED ORDER — IOHEXOL 300 MG/ML  SOLN
100.0000 mL | Freq: Once | INTRAMUSCULAR | Status: AC | PRN
  Administered 2023-08-25: 100 mL via INTRAVENOUS

## 2023-08-25 MED ORDER — BARIUM SULFATE 2 % PO SUSP
900.0000 mL | Freq: Once | ORAL | Status: AC
  Administered 2023-08-25: 900 mL via ORAL

## 2023-08-27 ENCOUNTER — Other Ambulatory Visit: Payer: Self-pay

## 2023-08-27 NOTE — Progress Notes (Signed)
Pharmacy Patient Advocate Encounter  Received notification from [facility] that Prior Authorization for Dupixent has been APPROVED from 09/03/23 to 09/01/24   PA #/Case ID/Reference #: [id number]

## 2023-08-30 ENCOUNTER — Other Ambulatory Visit: Payer: Self-pay

## 2023-08-30 NOTE — Progress Notes (Signed)
Specialty Pharmacy Refill Coordination Note  Kendra Salas is a 72 y.o. adult contacted today regarding refills of specialty medication(s) Dupilumab   Patient requested Courier to Provider Office   Delivery date: 09/08/23   Verified address: [facility] [address]   Medication will be filled on 09/07/23.

## 2023-09-01 ENCOUNTER — Other Ambulatory Visit: Payer: Self-pay

## 2023-09-03 ENCOUNTER — Other Ambulatory Visit: Payer: Self-pay

## 2023-09-06 ENCOUNTER — Other Ambulatory Visit: Payer: Self-pay

## 2023-09-06 ENCOUNTER — Other Ambulatory Visit: Payer: Self-pay | Admitting: Internal Medicine

## 2023-09-06 DIAGNOSIS — L0100 Impetigo, unspecified: Secondary | ICD-10-CM

## 2023-09-06 MED ORDER — MUPIROCIN 2 % EX OINT: 1.0000 | TOPICAL_OINTMENT | Freq: Two times a day (BID) | CUTANEOUS | 0 refills | Status: AC

## 2023-09-06 MED ORDER — AMOXICILLIN-POT CLAVULANATE 875-125 MG PO TABS
1.0000 | ORAL_TABLET | Freq: Two times a day (BID) | ORAL | 0 refills | Status: AC
Start: 2023-09-06 — End: 2023-09-16

## 2023-09-07 ENCOUNTER — Other Ambulatory Visit (HOSPITAL_COMMUNITY): Payer: Self-pay

## 2023-09-07 ENCOUNTER — Other Ambulatory Visit: Payer: Self-pay

## 2023-09-07 NOTE — Progress Notes (Signed)
Patient was contacted regarding delivery of speciality medication a Signature is Required in order to ship to patients work address. Patient expressed understanding of this requirement but asked if instead their delivery can be made to their home address of:  [address], [place], [address]  Medication will be filled and shipped 09/07/23 and will be delivered on 09/08/23

## 2023-09-16 ENCOUNTER — Other Ambulatory Visit: Payer: Self-pay

## 2023-09-16 MED FILL — Isosorbide Mononitrate Tab ER 24HR 30 MG: ORAL | 90 days supply | Qty: 90 | Fill #2 | Status: AC

## 2023-09-20 ENCOUNTER — Other Ambulatory Visit: Payer: Self-pay

## 2023-09-22 ENCOUNTER — Telehealth: Payer: Self-pay | Admitting: *Deleted

## 2023-09-22 DIAGNOSIS — I25118 Atherosclerotic heart disease of native coronary artery with other forms of angina pectoris: Secondary | ICD-10-CM

## 2023-09-22 NOTE — Telephone Encounter (Signed)
Left a message for the patient to call back to scheduled the coronary intervention.

## 2023-09-22 NOTE — Telephone Encounter (Signed)
Patient has been made aware of procedure date and time. She will have labs completed at her office.   Instructions have sent to MyChart as well.    [facility] A DEPT OF [facility] [address] [place], [address] [place] [address] Dept: [phone] Loc: [phone]  Kendra Salas  09/22/2023  You are scheduled for a Cardiac Catheterization on Wednesday, January 8 with Dr. Lorine Salas.  1. Please arrive at the [facility] (Main Entrance A) at [facility]: [address] [place], [place] [address] at 6:30 AM (This time is 2 hour(s) before your procedure to ensure your preparation).   Free valet parking service is available. You will check in at ADMITTING. The support person will be asked to wait in the waiting room.  It is OK to have someone drop you off and come back when you are ready to be discharged.    Special note: Every effort is made to have your procedure done on time. Please understand that emergencies sometimes delay scheduled procedures.  2. Diet: Do not eat solid foods after midnight.  The patient may have clear liquids until 5am upon the day of the procedure.  3. Labs: You will need to have blood drawn at [facility] by 10/08/23. You do not need to be fasting.  4. Medication instructions in preparation for your procedure: Nothing to hold  On the morning of your procedure, take your Aspirin 81 mg and Plavix/Clopidogrel and any morning medicines NOT listed above.  You may use sips of water.  5. Plan to go home the same day, you will only stay overnight if medically necessary. 6. Bring a current list of your medications and current insurance cards. 7. You MUST have a responsible person to drive you home. 8. Someone MUST be with you the first 24 hours after you arrive home or your discharge will be delayed. 9. Please wear clothes that are easy to get on and off and wear slip-on  shoes.  Thank you for allowing [place] to care for you!   -- [place] [facility]

## 2023-09-23 ENCOUNTER — Other Ambulatory Visit: Payer: Self-pay | Admitting: Internal Medicine

## 2023-09-23 DIAGNOSIS — I25118 Atherosclerotic heart disease of native coronary artery with other forms of angina pectoris: Secondary | ICD-10-CM

## 2023-09-23 MED ORDER — NITROGLYCERIN 0.4 MG SL SUBL: 0.4000 mg | SUBLINGUAL_TABLET | SUBLINGUAL | 1 refills | Status: AC | PRN

## 2023-09-23 MED FILL — Rosuvastatin Calcium Tab 40 MG: ORAL | 90 days supply | Qty: 90 | Fill #3 | Status: AC

## 2023-09-24 NOTE — Telephone Encounter (Signed)
We should bring her in for an office visit with APP so that we make sure she is seen within 1 month of the procedure.

## 2023-09-27 DIAGNOSIS — I25118 Atherosclerotic heart disease of native coronary artery with other forms of angina pectoris: Secondary | ICD-10-CM | POA: Diagnosis not present

## 2023-09-28 LAB — CBC
Hematocrit: 41.2 % (ref 34.0–46.6)
Hemoglobin: 13.4 g/dL (ref 11.1–15.9)
MCH: 27.8 pg (ref 26.6–33.0)
MCHC: 32.5 g/dL (ref 31.5–35.7)
MCV: 86 fL (ref 79–97)
Platelets: 283 10*3/uL (ref 150–450)
RBC: 4.82 x10E6/uL (ref 3.77–5.28)
RDW: 14 % (ref 11.7–15.4)
WBC: 6.5 10*3/uL (ref 3.4–10.8)

## 2023-09-28 LAB — BASIC METABOLIC PANEL
BUN/Creatinine Ratio: 11 — ABNORMAL LOW (ref 12–28)
BUN: 8 mg/dL (ref 8–27)
CO2: 22 mmol/L (ref 20–29)
Calcium: 8.9 mg/dL (ref 8.7–10.3)
Chloride: 101 mmol/L (ref 96–106)
Creatinine, Ser: 0.73 mg/dL (ref 0.57–1.00)
Glucose: 92 mg/dL (ref 70–99)
Potassium: 3.2 mmol/L — ABNORMAL LOW (ref 3.5–5.2)
Sodium: 137 mmol/L (ref 134–144)
eGFR: 88 mL/min/{1.73_m2} (ref >59)

## 2023-09-28 MED ORDER — POTASSIUM CHLORIDE ER 20 MEQ PO TBCR: 20.0000 meq | EXTENDED_RELEASE_TABLET | Freq: Every day | ORAL | 0 refills | Status: DC

## 2023-09-28 NOTE — Addendum Note (Signed)
Addended by: Sandi Mariscal on: 09/28/2023 11:14 AM   Modules accepted: Orders

## 2023-09-30 ENCOUNTER — Other Ambulatory Visit: Payer: Self-pay

## 2023-10-05 ENCOUNTER — Other Ambulatory Visit: Payer: Self-pay

## 2023-10-05 ENCOUNTER — Other Ambulatory Visit (HOSPITAL_COMMUNITY): Payer: Self-pay

## 2023-10-05 NOTE — Progress Notes (Signed)
 Specialty Pharmacy Refill Coordination Note  Kendra Salas is a 72 y.o. adult contacted today regarding refills of specialty medication(s) Dupilumab  (Dupixent )   Patient requested Delivery   Delivery date: 10/14/23   Verified address: [address] [place] [address]   Medication will be filled on 10/13/23.

## 2023-10-06 NOTE — H&P (View-Only) (Signed)
 Cardiology Clinic Note   Date: 10/08/2023 ID: [name], [name] 22-Jan-1951, MRN [id number]  Primary Cardiologist:  Redell Cave, MD  Patient Profile    Kendra Salas is a 73 y.o. adult who presents to the clinic today for follow up prior to cath.     Past medical history significant for: CAD. Multiple past PCI dating back to 1998 (8 stents to LAD, 1 stent to D2 June 2022) performed at [facility]. R/LHC 06/18/2023: Proximal RCA 30%.  Mid RCA 70%.  Mid to distal RCA 30%.  Proximal LAD 30%.  D1 10%.  Mid LAD 50%.  Mid to distal LAD 90%.  Second septal lesion 100%.  First RPL 60%.  Moderate to severely calcified coronary arteries with significant two-vessel coronary artery disease.  Multiple overlapping stents noted in the mid to distal LAD with severe in-stent restenosis distally.  D1 is patent with mild in-stent restenosis.  Proximal RCA stent patent with mild in-stent restenosis.  Heavily calcified 70% stenosis in mid RCA.  Right heart catheterization showed normal filling pressures, normal pulmonary pressure, normal cardiac output.  Discussion with heart team for CABG versus PCI. Staged intervention 07/07/2023: Successful OCT guided intravascular lithotripsy and PCI with DES 3.5 x 20 mm to mid RCA.  Plan for staged PCI to LAD. HFmrEF. Echo 05/12/2023: EF 45 to 50%.  Global hypokinesis.  Grade 1 DD.  Normal global strain.  Normal RV size/function.  Trivial MR.  Mild AI.  Mild dilatation of ascending aorta 44 mm. Hypertension. Hyperlipidemia. Lipid panel 12/22/2022: LDL 51, HDL 47, TG 92, total 116. Diverticulosis. Lymphoma.  In summary, patient was previously followed by [facility] cardiology for extensive CAD history undergoing PCI x 9 with 8 stents in LAD and 1 in D2 performed at [facility].  Last heart catheterization performed at [facility] June 2022 with PCI to D2.  Patient was first evaluated by Dr. Cave on 04/14/2023 to transfer cardiac care as prior  cardiologist moved.  She reported occasional chest discomfort with overexertion such as pushing her spouse in a wheelchair up a ramp.  Echo was updated and showed EF 45 to 50% as detailed above.  LHC in September 2024 showing moderate to severely calcified coronary arteries with significant two-vessel CAD as detailed above.  She underwent staged PCI with intravascular lithotripsy and DES to mid RCA.     History of Present Illness    Kendra Salas is followed by Dr. Cave for the above outlined history.  Patient was last seen in the office by Dr. Cave on 07/28/2023 for follow-up after cardiac intervention.  She was doing well at that time and reported the ability to walk briskly without chest pain or dyspnea.  She was anxious to schedule additional intervention prior to the end of the year.  Today, patient reports she is doing well. She continues to have exertional chest pain though none in the last week. Pain is the same as she has had previously. She denies shortness of breath. She has lost a lot of weight since September (~40 lb) secondary to depression. She states it is getting better. Offered referral but patient declined. She would like to proceed with planned coronary intervention.      ROS: All other systems reviewed and are otherwise negative except as noted in History of Present Illness.  EKGs/Labs Reviewed    EKG Interpretation Date/Time:  Friday October 08 2023 11:00:40 EST Ventricular Rate:  58 PR Interval:  160 QRS Duration:  90 QT Interval:  452 QTC Calculation: 443 R Axis:   14  Text Interpretation: Sinus bradycardia Low voltage QRS When compared with ECG of 28-Jul-2023 08:19, No significant change was found Confirmed by Loistine Sober [phone]) on 10/08/2023 11:20:23 AM   12/22/2022: ALT 16; AST 17 09/27/2023: BUN 8; Creatinine, Ser 0.73; Potassium 3.2; Sodium 137   09/27/2023: Hemoglobin 13.4; WBC 6.5   12/22/2022: TSH 1.68          Physical Exam     VS:  BP 123/86 (BP Location: Left Arm, Patient Position: Sitting, Cuff Size: Normal)   Pulse (!) 58   Ht 5' 11 (1.803 m)   Wt 156 lb (70.8 kg)   SpO2 91%   BMI 21.76 kg/m  , BMI Body mass index is 21.76 kg/m.  GEN: Well nourished, well developed, in no acute distress. Neck: No JVD or carotid bruits. Cardiac:  RRR. No murmurs. No rubs or gallops.   Respiratory:  Respirations regular and unlabored. Clear to auscultation without rales, wheezing or rhonchi. GI: Soft, nontender, nondistended. Extremities: Radials/DP/PT 2+ and equal bilaterally. No clubbing or cyanosis.Mild lower extremity edema. Compression socks in place.  Skin: Warm and dry, no rash. Neuro: Strength intact.  Assessment & Plan   CAD S/p multiple PCI with DES.  LHC September 2024 showed moderate to severely calcified coronary arteries with significant two-vessel CAD.  She underwent staged intervention in October 2024 with successful intravascular lithotripsy and PCI with DES to mid RCA.  She has now pending staged PCI to LAD.  She reports continued exertional chest pain consistent with previous pain. She denies dyspnea.  -Latest labs from 09/27/2023 reviewed: Creatinine 0.73, BUN 8, WBC 6.5, hemoglobin 13.4. -Proceed with staged intervention scheduled for 10/12/2022. -Continue aspirin , Plavix , amlodipine , Toprol , rosuvastatin , isosorbide , as needed SL NTG.  HFmrEF Echo August 2024 showed EF 45 to 50%, Grade I DD.  Patient reports stable lower extremity edema managed with compression socks. Mild lower extremity edema otherwise euvolemic and well compensated on exam. -Continue valsartan , Toprol , isosorbide .  Hypertension BP today 123/86.  -Continue amlodipine , Toprol , isosorbide , valsartan .  Hyperlipidemia LDL March 2024 51, at goal. -Continue rosuvastatin .  Disposition: Proceed with scheduled staged coronary intervention 1/8. Return in 2-3 weeks following cath or sooner as needed.      Informed Consent   Shared  Decision Making/Informed Consent The risks [stroke (1 in 1000), death (1 in 1000), kidney failure [usually temporary] (1 in 500), bleeding (1 in 200), allergic reaction [possibly serious] (1 in 200)], benefits (diagnostic support and management of coronary artery disease) and alternatives of a cardiac catheterization were discussed in detail with Ms. Diehl and she is willing to proceed.      Signed, Sober HERO. Redonna Wilbert, DNP, NP-C

## 2023-10-06 NOTE — Progress Notes (Signed)
 Cardiology Clinic Note   Date: 10/08/2023 ID: [name], [name] 22-Jan-1951, MRN [id number]  Primary Cardiologist:  Redell Cave, MD  Patient Profile    Kendra Salas is a 73 y.o. adult who presents to the clinic today for follow up prior to cath.     Past medical history significant for: CAD. Multiple past PCI dating back to 1998 (8 stents to LAD, 1 stent to D2 June 2022) performed at [facility]. R/LHC 06/18/2023: Proximal RCA 30%.  Mid RCA 70%.  Mid to distal RCA 30%.  Proximal LAD 30%.  D1 10%.  Mid LAD 50%.  Mid to distal LAD 90%.  Second septal lesion 100%.  First RPL 60%.  Moderate to severely calcified coronary arteries with significant two-vessel coronary artery disease.  Multiple overlapping stents noted in the mid to distal LAD with severe in-stent restenosis distally.  D1 is patent with mild in-stent restenosis.  Proximal RCA stent patent with mild in-stent restenosis.  Heavily calcified 70% stenosis in mid RCA.  Right heart catheterization showed normal filling pressures, normal pulmonary pressure, normal cardiac output.  Discussion with heart team for CABG versus PCI. Staged intervention 07/07/2023: Successful OCT guided intravascular lithotripsy and PCI with DES 3.5 x 20 mm to mid RCA.  Plan for staged PCI to LAD. HFmrEF. Echo 05/12/2023: EF 45 to 50%.  Global hypokinesis.  Grade 1 DD.  Normal global strain.  Normal RV size/function.  Trivial MR.  Mild AI.  Mild dilatation of ascending aorta 44 mm. Hypertension. Hyperlipidemia. Lipid panel 12/22/2022: LDL 51, HDL 47, TG 92, total 116. Diverticulosis. Lymphoma.  In summary, patient was previously followed by [facility] cardiology for extensive CAD history undergoing PCI x 9 with 8 stents in LAD and 1 in D2 performed at [facility].  Last heart catheterization performed at [facility] June 2022 with PCI to D2.  Patient was first evaluated by Dr. Cave on 04/14/2023 to transfer cardiac care as prior  cardiologist moved.  She reported occasional chest discomfort with overexertion such as pushing her spouse in a wheelchair up a ramp.  Echo was updated and showed EF 45 to 50% as detailed above.  LHC in September 2024 showing moderate to severely calcified coronary arteries with significant two-vessel CAD as detailed above.  She underwent staged PCI with intravascular lithotripsy and DES to mid RCA.     History of Present Illness    Kendra Salas is followed by Dr. Cave for the above outlined history.  Patient was last seen in the office by Dr. Cave on 07/28/2023 for follow-up after cardiac intervention.  She was doing well at that time and reported the ability to walk briskly without chest pain or dyspnea.  She was anxious to schedule additional intervention prior to the end of the year.  Today, patient reports she is doing well. She continues to have exertional chest pain though none in the last week. Pain is the same as she has had previously. She denies shortness of breath. She has lost a lot of weight since September (~40 lb) secondary to depression. She states it is getting better. Offered referral but patient declined. She would like to proceed with planned coronary intervention.      ROS: All other systems reviewed and are otherwise negative except as noted in History of Present Illness.  EKGs/Labs Reviewed    EKG Interpretation Date/Time:  Friday October 08 2023 11:00:40 EST Ventricular Rate:  58 PR Interval:  160 QRS Duration:  90 QT Interval:  452 QTC Calculation: 443 R Axis:   14  Text Interpretation: Sinus bradycardia Low voltage QRS When compared with ECG of 28-Jul-2023 08:19, No significant change was found Confirmed by Loistine Sober [phone]) on 10/08/2023 11:20:23 AM   12/22/2022: ALT 16; AST 17 09/27/2023: BUN 8; Creatinine, Ser 0.73; Potassium 3.2; Sodium 137   09/27/2023: Hemoglobin 13.4; WBC 6.5   12/22/2022: TSH 1.68          Physical Exam     VS:  BP 123/86 (BP Location: Left Arm, Patient Position: Sitting, Cuff Size: Normal)   Pulse (!) 58   Ht 5' 11 (1.803 m)   Wt 156 lb (70.8 kg)   SpO2 91%   BMI 21.76 kg/m  , BMI Body mass index is 21.76 kg/m.  GEN: Well nourished, well developed, in no acute distress. Neck: No JVD or carotid bruits. Cardiac:  RRR. No murmurs. No rubs or gallops.   Respiratory:  Respirations regular and unlabored. Clear to auscultation without rales, wheezing or rhonchi. GI: Soft, nontender, nondistended. Extremities: Radials/DP/PT 2+ and equal bilaterally. No clubbing or cyanosis.Mild lower extremity edema. Compression socks in place.  Skin: Warm and dry, no rash. Neuro: Strength intact.  Assessment & Plan   CAD S/p multiple PCI with DES.  LHC September 2024 showed moderate to severely calcified coronary arteries with significant two-vessel CAD.  She underwent staged intervention in October 2024 with successful intravascular lithotripsy and PCI with DES to mid RCA.  She has now pending staged PCI to LAD.  She reports continued exertional chest pain consistent with previous pain. She denies dyspnea.  -Latest labs from 09/27/2023 reviewed: Creatinine 0.73, BUN 8, WBC 6.5, hemoglobin 13.4. -Proceed with staged intervention scheduled for 10/12/2022. -Continue aspirin , Plavix , amlodipine , Toprol , rosuvastatin , isosorbide , as needed SL NTG.  HFmrEF Echo August 2024 showed EF 45 to 50%, Grade I DD.  Patient reports stable lower extremity edema managed with compression socks. Mild lower extremity edema otherwise euvolemic and well compensated on exam. -Continue valsartan , Toprol , isosorbide .  Hypertension BP today 123/86.  -Continue amlodipine , Toprol , isosorbide , valsartan .  Hyperlipidemia LDL March 2024 51, at goal. -Continue rosuvastatin .  Disposition: Proceed with scheduled staged coronary intervention 1/8. Return in 2-3 weeks following cath or sooner as needed.      Informed Consent   Shared  Decision Making/Informed Consent The risks [stroke (1 in 1000), death (1 in 1000), kidney failure [usually temporary] (1 in 500), bleeding (1 in 200), allergic reaction [possibly serious] (1 in 200)], benefits (diagnostic support and management of coronary artery disease) and alternatives of a cardiac catheterization were discussed in detail with Ms. Diehl and she is willing to proceed.      Signed, Sober HERO. Redonna Wilbert, DNP, NP-C

## 2023-10-08 ENCOUNTER — Ambulatory Visit: Payer: Commercial Managed Care - PPO | Attending: Student | Admitting: Student

## 2023-10-08 ENCOUNTER — Encounter: Payer: Self-pay | Admitting: Student

## 2023-10-08 VITALS — BP 123/86 | HR 58 | Ht 71.0 in | Wt 156.0 lb

## 2023-10-08 DIAGNOSIS — I10 Essential (primary) hypertension: Secondary | ICD-10-CM

## 2023-10-08 DIAGNOSIS — I25118 Atherosclerotic heart disease of native coronary artery with other forms of angina pectoris: Secondary | ICD-10-CM

## 2023-10-08 DIAGNOSIS — E785 Hyperlipidemia, unspecified: Secondary | ICD-10-CM

## 2023-10-08 DIAGNOSIS — I5022 Chronic systolic (congestive) heart failure: Secondary | ICD-10-CM | POA: Diagnosis not present

## 2023-10-08 NOTE — Patient Instructions (Signed)
 Medication Instructions:  - No changes *If you need a refill on your cardiac medications before your next appointment, please call your pharmacy*  Lab Work: - None ordered  Testing/Procedures: You are scheduled for a Cardiac Catheterization on Wednesday, January 8 with Dr. Deatrice Cage.   1. Please arrive at the [facility] (Main Entrance A) at [facility]: [address] [place], [place] [address] at 6:30 AM (This time is 2 hour(s) before your procedure to ensure your preparation).    Free valet parking service is available. You will check in at ADMITTING. The support person will be asked to wait in the waiting room.  It is OK to have someone drop you off and come back when you are ready to be discharged.     Special note: Every effort is made to have your procedure done on time. Please understand that emergencies sometimes delay scheduled procedures.   2. Diet: Do not eat solid foods after midnight.  The patient may have clear liquids until 5am upon the day of the procedure.   3. Labs: Completed 12/23   4. Medication instructions in preparation for your procedure: Nothing to hold   On the morning of your procedure, take your Aspirin  81 mg and Plavix /Clopidogrel  and any morning medicines NOT listed above.  You may use sips of water .   5. Plan to go home the same day, you will only stay overnight if medically necessary. 6. Bring a current list of your medications and current insurance cards. 7. You MUST have a responsible person to drive you home. 8. Someone MUST be with you the first 24 hours after you arrive home or your discharge will be delayed. 9. Please wear clothes that are easy to get on and off and wear slip-on shoes.   Thank you for allowing us  to care for you!   -- [facility]  Follow-Up: At [facility], you and your health needs are our priority.  As part of our continuing mission to provide you with exceptional heart  care, we have created designated Provider Care Teams.  These Care Teams include your primary Cardiologist (physician) and Advanced Practice Providers (APPs -  Physician Assistants and Nurse Practitioners) who all work together to provide you with the care you need, when you need it.  Your next appointment:   3 week(s) post cath  Provider:   You may see Redell Cave, MD or one of the following Advanced Practice Providers on your designated Care Team:   Lonni Meager, NP Bernardino Bring, PA-C Cadence Franchester, PA-C Tylene Lunch, NP Barnie Hila, NP

## 2023-10-10 ENCOUNTER — Other Ambulatory Visit: Payer: Self-pay

## 2023-10-12 ENCOUNTER — Telehealth: Payer: Self-pay | Admitting: *Deleted

## 2023-10-12 NOTE — Telephone Encounter (Signed)
 Copied from Dr Darron 10/12/23 Secure Chat message: I called Dr. Joshua and informed her that we are not able to proceed with angioplasty tomorrow because we do not have the Agent drug-coated balloon yet on the shelf yet. Please cancel her procedure for tomorrow. I told her that we will call her back to reschedule most likely to be done on January 15 but do not schedule yet until I get the final confirmation   Coronary Stent Intervention scheduled 10/13/23 has been cancelled.

## 2023-10-12 NOTE — Telephone Encounter (Addendum)
 Coronary Stent  scheduled at [facility] for: Wednesday October 13, 2023 8:30 AM Arrival time [facility] Main Entrance A at: 6:30 AM  Nothing to eat after midnight prior to procedure, clear liquids until 5 AM day of procedure.  Medication instructions: -Usual morning medications can be taken with sips of water  including aspirin  81 mg and Plavix  75 mg.   Plan to go home the same day, you will only stay overnight if medically necessary.  You must have responsible adult to drive you home.  Someone must be with you the first 24 hours after you arrive home.  Reviewed procedure instructions with patient.

## 2023-10-13 ENCOUNTER — Ambulatory Visit (HOSPITAL_COMMUNITY)
Admission: RE | Admit: 2023-10-13 | Payer: Commercial Managed Care - PPO | Source: Home / Self Care | Admitting: Cardiovascular Disease

## 2023-10-13 ENCOUNTER — Encounter (HOSPITAL_COMMUNITY): Admission: RE | Payer: Commercial Managed Care - PPO | Source: Home / Self Care

## 2023-10-13 ENCOUNTER — Other Ambulatory Visit: Payer: Self-pay

## 2023-10-13 SURGERY — CORONARY STENT INTERVENTION
Anesthesia: LOCAL

## 2023-10-14 ENCOUNTER — Encounter: Payer: Self-pay | Admitting: Pharmacist

## 2023-10-14 ENCOUNTER — Other Ambulatory Visit: Payer: Self-pay

## 2023-10-15 ENCOUNTER — Telehealth: Payer: Self-pay | Admitting: Cardiovascular Disease

## 2023-10-15 ENCOUNTER — Encounter: Payer: Self-pay | Admitting: Cardiology

## 2023-10-15 ENCOUNTER — Other Ambulatory Visit: Payer: Self-pay

## 2023-10-15 NOTE — Telephone Encounter (Signed)
 error

## 2023-10-15 NOTE — Telephone Encounter (Signed)
 Dr Elizabeth Sauer would like to speak to a Clinical Superviosr

## 2023-10-15 NOTE — Progress Notes (Signed)
 10/15/23: Dupixent - CMA  Dupixent credit card has been updated. Will ship 01/13.

## 2023-10-19 ENCOUNTER — Telehealth: Payer: Self-pay | Admitting: *Deleted

## 2023-10-19 NOTE — Telephone Encounter (Addendum)
 Coronary Stent scheduled at [facility] for: Wednesday October 20, 2023 8:30 AM Arrival time [facility] Main Entrance A at: 6:30 AM  Nothing to eat after midnight prior to procedure, clear liquids until 5 AM day of procedure.  Medication instructions: -Usual morning medications can be taken with sips of water  including aspirin  81 mg and Plavix  75 mg  Plan to go home the same day, you will only stay overnight if medically necessary.  You must have responsible adult to drive you home.  Someone must be with you the first 24 hours after you arrive home.  Reviewed procedure instructions with patient.

## 2023-10-19 NOTE — Telephone Encounter (Addendum)
 Attempted to contact Babs Sciara (ok per DPR). No answer- I left a message to please call back and asked to have me paged in the clinic.   Call initially routed to NL office.

## 2023-10-20 ENCOUNTER — Encounter (HOSPITAL_COMMUNITY)
Admission: RE | Disposition: A | Discharge status: Home / Self Care | Payer: Self-pay | Source: Home / Self Care | Attending: Cardiovascular Disease

## 2023-10-20 ENCOUNTER — Ambulatory Visit (HOSPITAL_COMMUNITY)
Admission: RE | Admit: 2023-10-20 | Discharge: 2023-10-20 | Disposition: A | Discharge status: Home / Self Care | Payer: Commercial Managed Care - PPO | Attending: Cardiovascular Disease | Admitting: Cardiovascular Disease

## 2023-10-20 ENCOUNTER — Other Ambulatory Visit: Payer: Self-pay

## 2023-10-20 DIAGNOSIS — T82855A Stenosis of coronary artery stent, initial encounter: Secondary | ICD-10-CM | POA: Insufficient documentation

## 2023-10-20 DIAGNOSIS — Z79899 Other long term (current) drug therapy: Secondary | ICD-10-CM | POA: Diagnosis not present

## 2023-10-20 DIAGNOSIS — E785 Hyperlipidemia, unspecified: Secondary | ICD-10-CM | POA: Diagnosis not present

## 2023-10-20 DIAGNOSIS — Z7982 Long term (current) use of aspirin: Secondary | ICD-10-CM | POA: Diagnosis not present

## 2023-10-20 DIAGNOSIS — X58XXXA Exposure to other specified factors, initial encounter: Secondary | ICD-10-CM | POA: Insufficient documentation

## 2023-10-20 DIAGNOSIS — Z955 Presence of coronary angioplasty implant and graft: Secondary | ICD-10-CM | POA: Diagnosis not present

## 2023-10-20 DIAGNOSIS — I25118 Atherosclerotic heart disease of native coronary artery with other forms of angina pectoris: Secondary | ICD-10-CM | POA: Insufficient documentation

## 2023-10-20 DIAGNOSIS — Z7902 Long term (current) use of antithrombotics/antiplatelets: Secondary | ICD-10-CM | POA: Insufficient documentation

## 2023-10-20 DIAGNOSIS — E876 Hypokalemia: Secondary | ICD-10-CM

## 2023-10-20 DIAGNOSIS — I5022 Chronic systolic (congestive) heart failure: Secondary | ICD-10-CM | POA: Insufficient documentation

## 2023-10-20 DIAGNOSIS — R079 Chest pain, unspecified: Secondary | ICD-10-CM | POA: Diagnosis present

## 2023-10-20 DIAGNOSIS — I110 Hypertensive heart disease with heart failure: Secondary | ICD-10-CM | POA: Diagnosis not present

## 2023-10-20 DIAGNOSIS — I2510 Atherosclerotic heart disease of native coronary artery without angina pectoris: Secondary | ICD-10-CM | POA: Diagnosis present

## 2023-10-20 DIAGNOSIS — I2584 Coronary atherosclerosis due to calcified coronary lesion: Secondary | ICD-10-CM | POA: Diagnosis not present

## 2023-10-20 HISTORY — PX: CORONARY ULTRASOUND/IVUS: CATH118244

## 2023-10-20 HISTORY — PX: CORONARY STENT INTERVENTION: CATH118234

## 2023-10-20 HISTORY — PX: CORONARY LITHOTRIPSY: CATH118330

## 2023-10-20 LAB — BASIC METABOLIC PANEL
Anion gap: 7 (ref 5–15)
BUN: 15 mg/dL (ref 8–23)
CO2: 25 mmol/L (ref 22–32)
Calcium: 8.5 mg/dL — ABNORMAL LOW (ref 8.9–10.3)
Chloride: 105 mmol/L (ref 98–111)
Creatinine, Ser: 0.96 mg/dL (ref 0.44–1.00)
GFR, Estimated: >60 mL/min (ref >60)
Glucose, Bld: 98 mg/dL (ref 70–99)
Potassium: 4.4 mmol/L (ref 3.5–5.1)
Sodium: 137 mmol/L (ref 135–145)

## 2023-10-20 LAB — POCT ACTIVATED CLOTTING TIME
Activated Clotting Time: 291 s
Activated Clotting Time: 199 s
Activated Clotting Time: 262 s
Activated Clotting Time: 308 s
Activated Clotting Time: 348 s

## 2023-10-20 SURGERY — CORONARY STENT INTERVENTION
Anesthesia: LOCAL

## 2023-10-20 MED ORDER — SODIUM CHLORIDE 0.9 % IV SOLN: INTRAVENOUS | Status: AC

## 2023-10-20 MED ORDER — VERAPAMIL HCL 2.5 MG/ML IV SOLN
INTRAVENOUS | Status: DC | PRN
  Administered 2023-10-20: 10 mL via INTRA_ARTERIAL

## 2023-10-20 MED ORDER — NITROGLYCERIN 1 MG/10 ML FOR IR/CATH LAB
INTRA_ARTERIAL | Status: AC
  Filled 2023-10-20: qty 10

## 2023-10-20 MED ORDER — CLOPIDOGREL BISULFATE 300 MG PO TABS
ORAL_TABLET | ORAL | Status: DC | PRN
  Administered 2023-10-20: 300 mg via ORAL

## 2023-10-20 MED ORDER — HEPARIN SODIUM (PORCINE) 1000 UNIT/ML IJ SOLN
INTRAMUSCULAR | Status: AC
  Filled 2023-10-20: qty 10

## 2023-10-20 MED ORDER — SODIUM CHLORIDE 0.9% FLUSH: 3.0000 mL | INTRAVENOUS | Status: DC | PRN

## 2023-10-20 MED ORDER — ASPIRIN 81 MG PO CHEW: 81.0000 mg | CHEWABLE_TABLET | ORAL | Status: DC

## 2023-10-20 MED ORDER — MIDAZOLAM HCL 2 MG/2ML IJ SOLN
INTRAMUSCULAR | Status: DC | PRN
  Administered 2023-10-20 (×2): 1 mg via INTRAVENOUS

## 2023-10-20 MED ORDER — HEPARIN (PORCINE) IN NACL 1000-0.9 UT/500ML-% IV SOLN
INTRAVENOUS | Status: DC | PRN
  Administered 2023-10-20 (×2): 500 mL

## 2023-10-20 MED ORDER — FENTANYL CITRATE (PF) 100 MCG/2ML IJ SOLN
INTRAMUSCULAR | Status: AC
  Filled 2023-10-20: qty 2

## 2023-10-20 MED ORDER — SODIUM CHLORIDE 0.9 % IV SOLN
INTRAVENOUS | Status: DC | PRN
  Administered 2023-10-20: 10 mL/h via INTRAVENOUS

## 2023-10-20 MED ORDER — FENTANYL CITRATE (PF) 100 MCG/2ML IJ SOLN
INTRAMUSCULAR | Status: DC | PRN
  Administered 2023-10-20 (×2): 25 ug via INTRAVENOUS

## 2023-10-20 MED ORDER — SODIUM CHLORIDE 0.9% FLUSH: 3.0000 mL | Freq: Two times a day (BID) | INTRAVENOUS | Status: DC

## 2023-10-20 MED ORDER — MIDAZOLAM HCL 2 MG/2ML IJ SOLN
INTRAMUSCULAR | Status: AC
  Filled 2023-10-20: qty 2

## 2023-10-20 MED ORDER — NITROGLYCERIN 1 MG/10 ML FOR IR/CATH LAB
INTRA_ARTERIAL | Status: DC | PRN
  Administered 2023-10-20: 100 ug via INTRACORONARY

## 2023-10-20 MED ORDER — SODIUM CHLORIDE 0.9 % IV SOLN: 250.0000 mL | INTRAVENOUS | Status: DC | PRN

## 2023-10-20 MED ORDER — ONDANSETRON HCL 4 MG/2ML IJ SOLN
INTRAMUSCULAR | Status: AC
  Filled 2023-10-20: qty 2

## 2023-10-20 MED ORDER — IOHEXOL 350 MG/ML SOLN
INTRAVENOUS | Status: DC | PRN
  Administered 2023-10-20: 110 mL

## 2023-10-20 MED ORDER — VERAPAMIL HCL 2.5 MG/ML IV SOLN
INTRAVENOUS | Status: AC
  Filled 2023-10-20: qty 2

## 2023-10-20 MED ORDER — SODIUM CHLORIDE 0.9 % IV SOLN: INTRAVENOUS | Status: DC

## 2023-10-20 MED ORDER — HEPARIN SODIUM (PORCINE) 1000 UNIT/ML IJ SOLN
INTRAMUSCULAR | Status: DC | PRN
  Administered 2023-10-20: 7000 [IU] via INTRAVENOUS
  Administered 2023-10-20: 2000 [IU] via INTRAVENOUS
  Administered 2023-10-20: 3000 [IU] via INTRAVENOUS

## 2023-10-20 MED ORDER — ONDANSETRON HCL 4 MG/2ML IJ SOLN
INTRAMUSCULAR | Status: DC | PRN
  Administered 2023-10-20: 4 mg via INTRAVENOUS

## 2023-10-20 MED ORDER — LIDOCAINE HCL (PF) 1 % IJ SOLN
INTRAMUSCULAR | Status: AC
  Filled 2023-10-20: qty 30

## 2023-10-20 MED ORDER — LIDOCAINE HCL (PF) 1 % IJ SOLN
INTRAMUSCULAR | Status: DC | PRN
  Administered 2023-10-20: 2 mL

## 2023-10-20 MED ORDER — ONDANSETRON HCL 4 MG/2ML IJ SOLN: 4.0000 mg | Freq: Four times a day (QID) | INTRAMUSCULAR | Status: DC | PRN

## 2023-10-20 SURGICAL SUPPLY — 36 items
BALL DC AGENT 3.0X30 (BALLOONS) IMPLANT
BALL DC AGENT 3.50X15 (BALLOONS) IMPLANT
BALLN EMERGE MR 2.0X30 (BALLOONS) ×1
BALLN SAPPHIRE 2.5X12 (BALLOONS) ×1
BALLN WOLVERINE 2.75X10 (BALLOONS) ×1
BALLN WOLVERINE 2.75X15 (BALLOONS) ×1
BALLN ~~LOC~~ EMERGE MR 2.75X30 (BALLOONS) ×1
BALLN ~~LOC~~ EMERGE MR 3.0X12 (BALLOONS) ×2
BALLN ~~LOC~~ EMERGE MR 3.25X12 (BALLOONS) ×1
BALLN ~~LOC~~ EMERGE MR 3.25X8 (BALLOONS) ×1
BALLOON EMERGE MR 2.0X30 (BALLOONS) IMPLANT
BALLOON SAPPHIRE 2.5X12 (BALLOONS) IMPLANT
BALLOON WOLVERINE 2.75X10 (BALLOONS) IMPLANT
BALLOON WOLVERINE 2.75X15 (BALLOONS) IMPLANT
BALLOON ~~LOC~~ EMERGE MR 2.75X30 (BALLOONS) IMPLANT
BALLOON ~~LOC~~ EMERGE MR 3.0X12 (BALLOONS) IMPLANT
BALLOON ~~LOC~~ EMERGE MR 3.25X12 (BALLOONS) IMPLANT
BALLOON ~~LOC~~ EMERGE MR 3.25X8 (BALLOONS) IMPLANT
CATH GUIDELINER COAST (CATHETERS) IMPLANT
CATH LAUNCHER 6FR EBU 3.75 (CATHETERS) IMPLANT
CATH LAUNCHER 6FR EBU3.5 (CATHETERS) IMPLANT
CATH OPTICROSS HD (CATHETERS) IMPLANT
CATH SHOCKWAVE C2 3.5X12 (CATHETERS) IMPLANT
DEVICE RAD COMP TR BAND LRG (VASCULAR PRODUCTS) IMPLANT
GLIDESHEATH SLEND SS 6F .021 (SHEATH) IMPLANT
GUIDEWIRE INQWIRE 1.5J.035X260 (WIRE) IMPLANT
INQWIRE 1.5J .035X260CM (WIRE) ×1
KIT ENCORE 26 ADVANTAGE (KITS) IMPLANT
KIT SINGLE USE MANIFOLD (KITS) IMPLANT
KIT SYRINGE INJ CVI SPIKEX1 (MISCELLANEOUS) IMPLANT
PACK CARDIAC CATHETERIZATION (CUSTOM PROCEDURE TRAY) ×1 IMPLANT
PROTECTION STATION PRESSURIZED (MISCELLANEOUS) ×1
SET ATX-X65L (MISCELLANEOUS) IMPLANT
STATION PROTECTION PRESSURIZED (MISCELLANEOUS) IMPLANT
TUBING CIL FLEX 10 FLL-RA (TUBING) IMPLANT
WIRE RUNTHROUGH .014X180CM (WIRE) IMPLANT

## 2023-10-20 NOTE — Interval H&P Note (Signed)
 Cath Lab Visit (complete for each Cath Lab visit)  Clinical Evaluation Leading to the Procedure:   ACS: No.  Non-ACS:    Anginal Classification: CCS III  Anti-ischemic medical therapy: Maximal Therapy (2 or more classes of medications)  Non-Invasive Test Results: No non-invasive testing performed  Prior CABG: No previous CABG      History and Physical Interval Note:  10/20/2023 8:48 AM  Kendra Salas  has presented today for surgery, with the diagnosis of coronary artery disease.  The various methods of treatment have been discussed with the patient and family. After consideration of risks, benefits and other options for treatment, the patient has consented to  Procedure(s): CORONARY STENT INTERVENTION (N/A) as a surgical intervention.  The patient's history has been reviewed, patient examined, no change in status, stable for surgery.  I have reviewed the patient's chart and labs.  Questions were answered to the patient's satisfaction.     Kendra Salas

## 2023-10-20 NOTE — Discharge Summary (Signed)
Discharge Summary for Same Day PCI   Patient ID: ALPHA RYLEE MRN: [id number]; DOB: 06-Sep-1951  Admit date: 10/20/2023 Discharge date: 10/20/2023  Primary Care Provider: Reubin Milan, MD  Primary Cardiologist: Kendra Odea, MD  Primary Electrophysiologist:  None   Discharge Diagnoses    Principal Problem:   Chest pain Active Problems:   CAD (coronary artery disease)  Diagnostic Studies/Procedures    Cardiac Catheterization 10/20/2023:    1st Diag lesion is 10% stenosed.   2nd Sept lesion is 100% stenosed.   Prox LAD lesion is 90% stenosed.   Mid LAD to Dist LAD lesion is 90% stenosed.   Mid LAD lesion is 70% stenosed.   Drug-coated balloon angioplasty was performed using a BALL DC AGENT 3.50X15.   Drug-coated balloon angioplasty was performed using a BALL DC AGENT 3.0X30.   Drug-coated balloon angioplasty was performed using a BALL DC AGENT 3.0X30.   Post intervention, there is a 20% residual stenosis.   Post intervention, there is a 15% residual stenosis.   Post intervention, there is a 20% residual stenosis.   1.  Significant progression of proximal LAD in-stent restenosis since September with severely underexpanded stent by IVUS.  In addition, progression of in-stent restenosis in the mid and distal LAD stents. 2.  Successful complex and very difficult Cutting Balloon angioplasty, intravascular lithotripsy to the proximal LAD and drug-coated balloon angioplasty to the whole stented segments.  Inability to deliver equipment distally without using a guide liner extension catheter.  The proximal LAD stent could not be fully dilated in spite of intravascular lithotripsy and high-pressure noncompliant balloon.   Recommendations: Continue indefinite dual antiplatelet therapy. If there is future restenosis in the LAD, recommend LIMA to LAD given that we already exhausted all the PCI options.  Diagnostic Dominance: Right  Intervention   _____________   History  of Present Illness     Kendra Salas is a 73 y.o. adult with PMH of CAD s/p multiple PCIs, HFmrEF, HTN, HLD, lymphoma who was recently seen in the clinic on 1/3. Patient was previously followed by [facility] cardiology for extensive CAD history undergoing PCI x 9 with 8 stents in LAD and 1 in D2 performed at [facility]. Last heart catheterization performed at [facility] June 2022 with PCI to D2. Patient was first evaluated by Dr. Azucena Cecil on 04/14/2023 to transfer cardiac care as prior cardiologist moved. They reported occasional chest discomfort with overexertion such as pushing their spouse in a wheelchair up a ramp. Echo was updated and showed EF 45 to 50% as detailed above. LHC in September 2024 showing moderate to severely calcified coronary arteries with significant two-vessel CAD as detailed above. Underwent staged PCI with intravascular lithotripsy and DES to mid RCA. Seen in the office by Dr. Azucena Cecil on 07/28/2023 for follow-up after cardiac intervention. Was doing well at that time and reported the ability to walk briskly without chest pain or dyspnea. Was anxious to schedule additional intervention prior to the end of the year. Last seen in the office with Carlos Levering on 1/3 and set up for outpatient cardiac cath.  Hospital Course     The patient underwent cardiac cath as noted above with significant ISR of pLAD treated with successful complex cutting balloon angioplasty and lithotripsy. Inability to deliver equipment distally without using a guide liner extension catheter. The pLAD stent could not be fully dilated in spite of intravascular lithotripsy and high-pressure noncompliant balloon. Plan for DAPT with ASA/plavix indefinitely. The  patient was seen by cardiac rehab while in short stay. There were no observed complications post cath. Radial cath site was re-evaluated prior to discharge and found to be stable without any complications. Instructions/precautions  regarding cath site care were given prior to discharge.  Kendra Salas was seen by Dr. Kirke Corin and determined stable for discharge home. Follow up with our office has been arranged. Medications are listed below. Pertinent changes include n/a.  _____________  Cath/PCI Registry Performance & Quality Measures: Aspirin prescribed? - Yes ADP Receptor Inhibitor (Plavix/Clopidogrel, Brilinta/Ticagrelor or Effient/Prasugrel) prescribed (includes medically managed patients)? - Yes High Intensity Statin (Lipitor 40-80mg  or Crestor 20-40mg ) prescribed? - Yes For EF <40%, was ACEI/ARB prescribed? - Not Applicable (EF >/= 40%) For EF <40%, Aldosterone Antagonist (Spironolactone or Eplerenone) prescribed? - Not Applicable (EF >/= 40%) Cardiac Rehab Phase II ordered (Included Medically managed Patients)? - Yes  _____________   Discharge Vitals Blood pressure 114/85, pulse (!) 58, temperature 97.9 F (36.6 C), temperature source Oral, resp. rate 20, height 5\' 11"  (1.803 m), weight 70.3 kg, SpO2 95%.  Filed Weights   10/20/23 0657  Weight: 70.3 kg    Last Labs & Radiologic Studies    CBC No results for input(s): "WBC", "NEUTROABS", "HGB", "HCT", "MCV", "PLT" in the last 72 hours. Basic Metabolic Panel Recent Labs    16/10/96 0703  NA 137  K 4.4  CL 105  CO2 25  GLUCOSE 98  BUN 15  CREATININE 0.96  CALCIUM 8.5*   Liver Function Tests No results for input(s): "AST", "ALT", "ALKPHOS", "BILITOT", "PROT", "ALBUMIN" in the last 72 hours. No results for input(s): "LIPASE", "AMYLASE" in the last 72 hours. High Sensitivity Troponin:   No results for input(s): "TROPONINIHS" in the last 720 hours.  BNP Invalid input(s): "POCBNP" D-Dimer No results for input(s): "DDIMER" in the last 72 hours. Hemoglobin A1C No results for input(s): "HGBA1C" in the last 72 hours. Fasting Lipid Panel No results for input(s): "CHOL", "HDL", "LDLCALC", "TRIG", "CHOLHDL", "LDLDIRECT" in the last 72 hours. Thyroid  Function Tests No results for input(s): "TSH", "T4TOTAL", "T3FREE", "THYROIDAB" in the last 72 hours.  Invalid input(s): "FREET3" _____________  CARDIAC CATHETERIZATION Result Date: 10/20/2023   1st Diag lesion is 10% stenosed.   2nd Sept lesion is 100% stenosed.   Prox LAD lesion is 90% stenosed.   Mid LAD to Dist LAD lesion is 90% stenosed.   Mid LAD lesion is 70% stenosed.   Drug-coated balloon angioplasty was performed using a BALL DC AGENT 3.50X15.   Drug-coated balloon angioplasty was performed using a BALL DC AGENT 3.0X30.   Drug-coated balloon angioplasty was performed using a BALL DC AGENT 3.0X30.   Post intervention, there is a 20% residual stenosis.   Post intervention, there is a 15% residual stenosis.   Post intervention, there is a 20% residual stenosis. 1.  Significant progression of proximal LAD in-stent restenosis since September with severely underexpanded stent by IVUS.  In addition, progression of in-stent restenosis in the mid and distal LAD stents. 2.  Successful complex and very difficult Cutting Balloon angioplasty, intravascular lithotripsy to the proximal LAD and drug-coated balloon angioplasty to the whole stented segments.  Inability to deliver equipment distally without using a guide liner extension catheter.  The proximal LAD stent could not be fully dilated in spite of intravascular lithotripsy and high-pressure noncompliant balloon. Recommendations: Continue indefinite dual antiplatelet therapy. If there is future restenosis in the LAD, recommend LIMA to LAD given that we already exhausted all  the PCI options.    Disposition   Pt is being discharged home today in good condition.  Follow-up Plans & Appointments     Discharge Instructions     AMB Referral to Cardiac Rehabilitation - Phase II   Complete by: As directed    Diagnosis:  Coronary Stents Stable Angina     After initial evaluation and assessments completed: Virtual Based Care may be provided alone or in  conjunction with Phase 2 Cardiac Rehab based on patient barriers.: Yes   Intensive Cardiac Rehabilitation (ICR) [facility] location only OR Traditional Cardiac Rehabilitation (TCR) *If criteria for ICR are not met will enroll in TCR [facility] only): Yes        Discharge Medications   Allergies as of 10/20/2023   No Known Allergies      Medication List     TAKE these medications    acetaminophen 500 MG tablet Commonly known as: TYLENOL Take 500 mg by mouth every 6 (six) hours as needed (pain.).   amLODipine 5 MG tablet Commonly known as: NORVASC Take 1 tablet (5 mg total) by mouth daily.   aspirin EC 81 MG tablet Take 81 mg by mouth in the morning.   clopidogrel 75 MG tablet Commonly known as: PLAVIX Take 1 tablet (75 mg total) by mouth daily. OFFICE VISIT NEEDED FOR ADDITIONAL REFILLS   Dupixent 300 MG/2ML prefilled syringe Generic drug: dupilumab INJECT 1 SYRINGE UNDER THE SKIN AS DIRECTED EVERY OTHER WEEK   estradiol 2 MG tablet Commonly known as: ESTRACE Take 1 tablet (2 mg total) by mouth daily.   finasteride 5 MG tablet Commonly known as: PROSCAR Take 1 tablet (5 mg total) by mouth daily.   hyoscyamine 0.125 MG Tbdp disintergrating tablet Commonly known as: ANASPAZ Place 1 tablet (0.125 mg total) under the tongue every 6 (six) hours as needed.   isosorbide mononitrate 30 MG 24 hr tablet Commonly known as: IMDUR Take 1 tablet (30 mg total) by mouth daily.   metoprolol succinate 25 MG 24 hr tablet Commonly known as: Toprol XL Take 1 tablet (25 mg total) by mouth daily.   mupirocin ointment 2 % Commonly known as: BACTROBAN Apply 1 Application topically 2 (two) times daily.   nitroGLYCERIN 0.4 MG SL tablet Commonly known as: NITROSTAT Place 1 tablet (0.4 mg total) under the tongue every 5 (five) minutes as needed for chest pain.   Opzelura 1.5 % Crea Generic drug: Ruxolitinib Phosphate Apply 1 Application topically as directed. Qd to aa eczema   pantoprazole  40 MG tablet Commonly known as: PROTONIX Take 1 tablet (40 mg total) by mouth daily.   rosuvastatin 40 MG tablet Commonly known as: CRESTOR Take 1 tablet (40 mg total) by mouth daily.   triamcinolone cream 0.1 % Commonly known as: KENALOG Apply 1 application topically 2 (two) times daily. To rash on legs What changed:  when to take this reasons to take this additional instructions   valsartan 160 MG tablet Commonly known as: DIOVAN Take 1 tablet (160 mg total) by mouth daily.        Allergies No Known Allergies  Outstanding Labs/Studies   N/a   Duration of Discharge Encounter   Greater than 30 minutes including physician time.  Signed, Laverda Page, NP 10/20/2023, 1:41 PM

## 2023-10-20 NOTE — Progress Notes (Signed)
 CARDIAC REHAB PHASE I     Post stent education including site care, restrictions, risk factors, exercise guidelines, NTG use, antiplatelet therapy importance, heart healthy diet and CRP2 reviewed. All questions and concerns addressed. Will refer to [facility]  for CRP2. Plan for home later today.     Ronny Colas, RN BSN 10/20/2023 3:09 PM

## 2023-10-20 NOTE — Telephone Encounter (Signed)
 Follow up call to [facility] this morning. Per [name], there was some confusion about the patient's heart cath that was scheduled for today, however she is currently at the hospital having her procedure.   I apologized for the delay in reaching out to her as the call was not forwarded to me initially.   Kendra Salas voices understanding and is agreeable. Nothing further needed at this time.

## 2023-10-20 NOTE — Discharge Instructions (Signed)
 Radial Site Care The following information offers guidance on how to care for yourself after your procedure. Your health care provider may also give you more specific instructions. If you have problems or questions, contact your health care provider. What can I expect after the procedure? After the procedure, it is common to have bruising and tenderness in the incision area. Follow these instructions at home: Incision site care  Follow instructions from your health care provider about how to take care of your incision site. Make sure you: Wash your hands with soap and water for at least 20 seconds before and after you change your bandage (dressing). If soap and water are not available, use hand sanitizer. Remove your dressing in 24 hours. Leave stitches (sutures), skin glue, or adhesive strips in place. These skin closures may need to stay in place for 2 weeks or longer. If adhesive strip edges start to loosen and curl up, you may trim the loose edges. Do not remove adhesive strips completely unless your health care provider tells you to do that. Do not take baths, swim, or use a hot tub for at least 1 week. You may shower 24 hours after the procedure or as told by your health care provider. Remove the dressing and gently wash the incision area with plain soap and water. Pat the area dry with a clean towel. Do not rub the site. That could cause bleeding. Do not apply powder or lotion to the site. Check your incision site every day for signs of infection. Check for: Redness, swelling, or pain. Fluid or blood. Warmth. Pus or a bad smell. Activity For 24 hours after the procedure, or as directed by your health care provider: Do not flex or bend the affected arm. Do not push or pull heavy objects with the affected arm. Do not operate machinery or power tools. Do not drive. You should not drive yourself home from the hospital or clinic if you go home during that time period. You may drive 24  hours after the procedure unless your health care provider tells you not to. Do not lift anything that is heavier than 10 lb (4.5 kg), or the limit that you are told, until your health care provider says that it is safe. Return to your normal activities as told by your health care provider. Ask your health care provider what activities are safe for you and when you can return to work. If you were given a sedative during the procedure, it can affect you for several hours. Do not drive or operate machinery until your health care provider says that it is safe. General instructions Take over-the-counter and prescription medicines only as told by your health care provider. If you will be going home right after the procedure, plan to have a responsible adult care for you for the time you are told. This is important. Keep all follow-up visits. This is important. Contact a health care provider if: You have a fever or chills. You have any of these signs of infection at your incision site: Redness, swelling, or pain. Fluid or blood. Warmth. Pus or a bad smell. Get help right away if: The incision area swells very fast. The incision area is bleeding, and the bleeding does not stop when you hold steady pressure on the area. Your arm or hand becomes pale, cool, tingly, or numb. These symptoms may represent a serious problem that is an emergency. Do not wait to see if the symptoms will go away. Get medical  help right away. Call your local emergency services (911 in the [place] Do not drive yourself to the hospital. Summary After the procedure, it is common to have bruising and tenderness at the incision site. Follow instructions from your health care provider about how to take care of your radial site incision. Check the incision every day for signs of infection. Do not lift anything that is heavier than 10 lb (4.5 kg), or the limit that you are told, until your health care provider says that it is  safe. Get help right away if the incision area swells very fast, you have bleeding at the incision site that will not stop, or your arm or hand becomes pale, cool, or numb. This information is not intended to replace advice given to you by your health care provider. Make sure you discuss any questions you have with your health care provider. Document Revised: 11/10/2020 Document Reviewed: 11/10/2020 Elsevier Patient Education  2024 [employer].

## 2023-10-21 ENCOUNTER — Other Ambulatory Visit: Payer: Self-pay

## 2023-10-21 ENCOUNTER — Other Ambulatory Visit: Payer: Self-pay | Admitting: Internal Medicine

## 2023-10-21 ENCOUNTER — Encounter (HOSPITAL_COMMUNITY): Payer: Self-pay | Admitting: Cardiovascular Disease

## 2023-10-21 DIAGNOSIS — K449 Diaphragmatic hernia without obstruction or gangrene: Secondary | ICD-10-CM

## 2023-10-21 DIAGNOSIS — Z79890 Hormone replacement therapy (postmenopausal): Secondary | ICD-10-CM

## 2023-10-21 MED FILL — Pantoprazole Sodium EC Tab 40 MG (Base Equiv): ORAL | 90 days supply | Qty: 90 | Fill #0 | Status: AC

## 2023-10-21 MED FILL — Estradiol Tab 2 MG: ORAL | 90 days supply | Qty: 90 | Fill #0 | Status: AC

## 2023-10-21 NOTE — Telephone Encounter (Signed)
Requested Prescriptions  Pending Prescriptions Disp Refills   estradiol (ESTRACE) 2 MG tablet 90 tablet 0    Sig: Take 1 tablet (2 mg total) by mouth daily.     OB/GYN:  Estrogens Passed - 10/21/2023  3:29 PM      Passed - Mammogram is up-to-date per Health Maintenance      Passed - Last BP in normal range    BP Readings from Last 1 Encounters:  10/20/23 108/81         Passed - Valid encounter within last 12 months    Recent Outpatient Visits           9 months ago Benign essential HTN   [facility], Nyoka Cowden, MD   12 months ago Encounter for medication review   [facility] [place] Drucilla Chalet, RPH-CPP   2 years ago Acute cough   [facility] at [facility], Nyoka Cowden, MD   2 years ago Encounter for medication review   [facility] [place]. [facility] Drucilla Chalet, RPH-CPP   2 years ago Annual physical exam   [facility] at [facility], Nyoka Cowden, MD       Future Appointments             In 1 week Dunn, Raymon Mutton, PA-C [facility] [place]   In 1 month Deirdre Evener, MD [facility]   In 8 months Deirdre Evener, MD [facility]             pantoprazole (PROTONIX) 40 MG tablet 90 tablet 0    Sig: Take 1 tablet (40 mg total) by mouth daily.     Gastroenterology: Proton Pump Inhibitors Passed - 10/21/2023  3:29 PM      Passed - Valid encounter within last 12 months    Recent Outpatient Visits           9 months ago Benign essential HTN   [facility] at [facility], Nyoka Cowden, MD   12 months ago Encounter for medication review   [facility] [place]. [facility] Drucilla Chalet, RPH-CPP   2 years ago Acute cough   [facility] at [facility], Nyoka Cowden, MD   2 years ago Encounter for medication review   [facility] Drucilla Chalet, RPH-CPP   2 years ago Annual physical exam   [facility] at [facility], Nyoka Cowden, MD       Future Appointments             In 1 week Dunn, Raymon Mutton, PA-C [facility] [place]   In 1 month Deirdre Evener, MD [facility]   In 8 months Deirdre Evener, MD [facility]

## 2023-10-22 ENCOUNTER — Other Ambulatory Visit: Payer: Self-pay

## 2023-10-27 ENCOUNTER — Other Ambulatory Visit: Payer: Self-pay | Admitting: Pharmacist

## 2023-10-27 ENCOUNTER — Telehealth: Payer: Self-pay | Admitting: Pharmacist

## 2023-10-27 ENCOUNTER — Ambulatory Visit: Payer: Commercial Managed Care - PPO | Attending: Internal Medicine | Admitting: Pharmacist

## 2023-10-27 DIAGNOSIS — Z79899 Other long term (current) drug therapy: Secondary | ICD-10-CM

## 2023-10-27 NOTE — Progress Notes (Signed)
   S: Patient is currently taking Dupixent for atopic dermatitis. Patient is managed by Dr. Gwen Pounds for this.  Adherence: denies any missed doses  Efficacy: reports that it is working well for her  Dosing: 300 mg every 14 days  Dose adjustments: Renal: no dose adjustments (has not been studied) Hepatic: no dose adjustments (has not been studied)  Screening: TB test: completed per patient  Monitoring: S/sx of infection: denies S/sx of hypersensitivity: denies S/sx of ocular effects: denies S/sx of eosinophilia/vasculitis: denies  O:  Lab Results  Component Value Date   WBC 6.5 09/27/2023   HGB 13.4 09/27/2023   HCT 41.2 09/27/2023   MCV 86 09/27/2023   PLT 283 09/27/2023      Chemistry      Component Value Date/Time   NA 137 10/20/2023 0703   NA 137 09/27/2023 1320   NA 139 10/16/2014 0845   K 4.4 10/20/2023 0703   K 3.7 10/16/2014 0845   CL 105 10/20/2023 0703   CL 107 10/16/2014 0845   CO2 25 10/20/2023 0703   CO2 25 10/16/2014 0845   BUN 15 10/20/2023 0703   BUN 8 09/27/2023 1320   BUN 11 10/16/2014 0845   CREATININE 0.96 10/20/2023 0703   CREATININE 0.85 10/16/2014 0845   GLU 89 12/22/2022 0000      Component Value Date/Time   CALCIUM 8.5 (L) 10/20/2023 0703   CALCIUM 8.1 (L) 10/16/2014 0845   ALKPHOS 54 12/22/2022 0000   ALKPHOS 47 10/16/2014 0845   AST 17 12/22/2022 0000   AST 23 10/16/2014 0845   ALT 16 12/22/2022 0000   ALT 18 10/16/2014 0845   BILITOT 0.6 03/18/2022 1338   BILITOT 0.5 05/04/2019 0803   BILITOT 0.3 10/16/2014 0845       A/P: 1. Medication review: Patient currently on Dupixent for eczema and is tolerating it well. Reviewed the medication with the patient, including the following: Dupixent is a monoclonal antibody used for the treatment of asthma or atopic dermatitis. Patient educated on purpose, proper use and potential adverse effects of Dupixent. Possible adverse effects include increased risk of infection, ocular  effects, vasculitis/eosinophilia, and hypersensitivity reactions. No recommendations for any changes.  Butch Penny, PharmD, [place], CPP Clinical Pharmacist [facility] [phone]

## 2023-10-27 NOTE — Telephone Encounter (Signed)
Called patient to schedule an appointment for the [employer] [facility]. I was unable to reach the patient so I left a HIPAA-compliant message requesting that the patient return my call.   Luke Van Ausdall, PharmD, BCACP, CPP Clinical Pharmacist [facility] [phone]  

## 2023-10-27 NOTE — Progress Notes (Signed)
Opened in error

## 2023-10-28 ENCOUNTER — Ambulatory Visit: Payer: Commercial Managed Care - PPO | Admitting: Family Medicine

## 2023-11-01 ENCOUNTER — Other Ambulatory Visit: Payer: Self-pay

## 2023-11-02 ENCOUNTER — Other Ambulatory Visit (HOSPITAL_COMMUNITY): Payer: Self-pay

## 2023-11-02 NOTE — Progress Notes (Unsigned)
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  Her cardiac history dates back to 67.  She was previously followed by [facility] cardiology and [facility] cardiology, establishing with Dr. Azucena Cecil in 04/2023.  LHC in 03/2021 showed single-vessel CAD with patent prior LAD stents with mild in-stent restenosis.  She underwent successful IVUS-guided PCI to D2. Treadmill MPI in 10/2021 showed no evidence of ischemia with small perfusion abnormality of mild intensity of the apical myocardium consistent with prior infarct versus scar.  LVEF 51%.  Upon establishing with our office in 04/2023 she noted exertional chest discomfort.  Echo in 05/2023 showed an EF of 45 to 50%, global hypokinesis, grade 1 diastolic dysfunction, normal RV systolic function and ventricular cavity size, trivial mitral regurgitation, mild aortic insufficiency, and mild dilatation of the ascending aorta measuring 44 mm.  R/LHC in 06/2023 showed moderately to severely calcified coronary arteries with significant two-vessel CAD.  Multiple overlapping stents noted in the mid to distal LAD with severe in-stent restenosis distally.  First diagonal stent was patent with mild in-stent restenosis.  Proximal RCA stent was patent with mild in-stent restenosis.  There was heavily calcified 70% stenosis in the mid RCA.  RHC showed normal filling pressures, normal pulmonary pressure, and normal cardiac output.  Patient was subsequently discussed with heart team meeting with recommendation to proceed with staged PCI.  She underwent repeat LHC in 07/2023 with successful OCT guided intravascular  lithotripsy and DES placement to the mid RCA with plans for staged PCI of the LAD.  She underwent repeat LHC on 10/20/2023 with notation of significant progression of proximal LAD in-stent restenosis since cath in 06/2023 with severely underexpanded stent by IVUS.  In addition, there was progression of in-stent restenosis of the mid and distal LAD stents.  She underwent successful, complex and very difficult Cutting Balloon angioplasty, intravascular lithotripsy to the proximal LAD and drug-coated balloon angioplasty to the whole stented segments.  There was inability to deliver equipment distally without using a guide liner extension catheter.  The proximal LAD stent could not be fully dilated despite use of intravascular lithotripsy and high-pressure noncompliant balloon.  It was recommended if there was future restenosis in the LAD to pursue LIMA to LAD given all PCI options were exhausted.  She presents to the office today for follow-up of cardiac cath and is doing well.  She is without symptoms of angina or cardiac decompensation.  No dizziness, presyncope, or syncope.  She no longer has exertional lower extremity fatigue after climbing a flight of stairs.  No lower extremity swelling.  Adherent and tolerating cardiac medications without off target effects.  No falls or symptoms concerning for bleeding.  No right radial arteriotomy site complications.  Overall feels very well and is pleased with her improvement in symptoms.  She does not have any acute cardiac concerns at this time.   Labs independently reviewed: 10/2023 - potassium 4.4, BUN 15, serum creatinine 0.96 09/2023 - Hgb 13.4, PLT 283 12/2022 - TSH normal, A1c 5.6, albumin 4.2, AST/ALT normal, TC 116, TG 92, HDL 47, LDL 51  Past  Medical History:  Diagnosis Date   Anxiety    Basal cell carcinoma 03/07/2008   left dorsum mid forearm   Basal cell carcinoma 04/19/2013   left distal lat ant thigh   Basal cell carcinoma 04/11/2014   right  forearm, right post shoulder near deltoid   Basal cell carcinoma 10/12/2018   left mid back 7.0 cm lat to spine   Basal cell carcinoma 06/16/2023   R forehead, ED&C   CAD (coronary artery disease)    Clotting disorder (HCC)    Dysplastic nevus 02/02/2007   left volar mid to prox forearm   Dysplastic nevus 07/09/2010   right medial breast 1.5 cm med to areola, right med breast parasternal, right post distal deltoid   Eczema    GERD (gastroesophageal reflux disease)    Hyperlipemia    Hypertension    Lymphoma (HCC) 2015   Melanoma in situ (HCC) 12/22/2006   right medial breast, med sup quad   Personal history of chemotherapy 2017   6 months of chemo    Past Surgical History:  Procedure Laterality Date   AUGMENTATION MAMMAPLASTY Bilateral 2003   CARDIAC CATHETERIZATION     X3 - has 9 stents. 4 LAD, 1 posterior, Most recent cath 03/2021   COLONOSCOPY  06/29/2012   repeat 10 yrs - Dr. Bluford Kaufmann   COLONOSCOPY WITH PROPOFOL N/A 08/07/2022   Procedure: COLONOSCOPY WITH PROPOFOL;  Surgeon: Midge Minium, MD;  Location: [facility];  Service: Endoscopy;  Laterality: N/A;  needs to be first case   CORONARY IMAGING/OCT N/A 07/07/2023   Procedure: CORONARY IMAGING/OCT;  Surgeon: Iran Ouch, MD;  Location: [facility];  Service: Cardiovascular;  Laterality: N/A;   CORONARY LITHOTRIPSY N/A 07/07/2023   Procedure: CORONARY LITHOTRIPSY;  Surgeon: Iran Ouch, MD;  Location: [facility];  Service: Cardiovascular;  Laterality: N/A;   CORONARY LITHOTRIPSY N/A 10/20/2023   Procedure: CORONARY LITHOTRIPSY;  Surgeon: Iran Ouch, MD;  Location: [facility];  Service: Cardiovascular;  Laterality: N/A;   CORONARY STENT INTERVENTION N/A 07/07/2023   Procedure: CORONARY STENT INTERVENTION;  Surgeon: Iran Ouch, MD;  Location: [facility];  Service: Cardiovascular;  Laterality: N/A;   CORONARY STENT INTERVENTION N/A 10/20/2023   Procedure: CORONARY STENT  INTERVENTION;  Surgeon: Iran Ouch, MD;  Location: [facility];  Service: Cardiovascular;  Laterality: N/A;   CORONARY ULTRASOUND/IVUS N/A 10/20/2023   Procedure: Coronary Ultrasound/IVUS;  Surgeon: Iran Ouch, MD;  Location: [facility];  Service: Cardiovascular;  Laterality: N/A;   ESOPHAGOGASTRODUODENOSCOPY (EGD) WITH PROPOFOL N/A 11/22/2015   Procedure: ESOPHAGOGASTRODUODENOSCOPY (EGD) WITH PROPOFOL;  Surgeon: Midge Minium, MD;  Location: [facility];  Service: Endoscopy;  Laterality: N/A;   FACIAL COSMETIC SURGERY     gender re-affirmation     HERNIA REPAIR     x2   POLYPECTOMY  08/07/2022   Procedure: POLYPECTOMY;  Surgeon: Midge Minium, MD;  Location: [facility];  Service: Endoscopy;;   PORTA CATH REMOVAL N/A 04/10/2019   Procedure: PORTA CATH REMOVAL;  Surgeon: Annice Needy, MD;  Location: [facility];  Service: Cardiovascular;  Laterality: N/A;   RIGHT/LEFT HEART CATH AND CORONARY ANGIOGRAPHY Bilateral 06/18/2023   Procedure: RIGHT/LEFT HEART CATH AND CORONARY ANGIOGRAPHY;  Surgeon: Iran Ouch, MD;  Location: [facility] INV[facility] CV [facility];  Service: Cardiovascular;  Laterality: Bilateral;    Current Medications: Current Meds  Medication Sig   acetaminophen (TYLENOL) 500  MG tablet Take 500 mg by mouth every 6 (six) hours as needed (pain.).   amLODipine (NORVASC) 5 MG tablet Take 1 tablet (5 mg total) by mouth daily.   aspirin EC 81 MG tablet Take 81 mg by mouth in the morning.   clopidogrel (PLAVIX) 75 MG tablet Take 1 tablet (75 mg total) by mouth daily. OFFICE VISIT NEEDED FOR ADDITIONAL REFILLS   dupilumab (DUPIXENT) 300 MG/2ML prefilled syringe INJECT 1 SYRINGE UNDER THE SKIN AS DIRECTED EVERY OTHER WEEK   estradiol (ESTRACE) 2 MG tablet Take 1 tablet (2 mg total) by mouth daily.   finasteride (PROSCAR) 5 MG tablet Take 1 tablet (5 mg total) by mouth daily.   hyoscyamine (ANASPAZ) 0.125 MG TBDP disintergrating tablet Place 1  tablet (0.125 mg total) under the tongue every 6 (six) hours as needed.   isosorbide mononitrate (IMDUR) 30 MG 24 hr tablet Take 1 tablet (30 mg total) by mouth daily.   metoprolol succinate (TOPROL XL) 25 MG 24 hr tablet Take 1 tablet (25 mg total) by mouth daily.   mupirocin ointment (BACTROBAN) 2 % Apply 1 Application topically 2 (two) times daily. (Patient taking differently: Apply 1 Application topically 2 (two) times daily as needed (nasal flares).)   nitroGLYCERIN (NITROSTAT) 0.4 MG SL tablet Place 1 tablet (0.4 mg total) under the tongue every 5 (five) minutes as needed for chest pain.   pantoprazole (PROTONIX) 40 MG tablet Take 1 tablet (40 mg total) by mouth daily.   rosuvastatin (CRESTOR) 40 MG tablet Take 1 tablet (40 mg total) by mouth daily.   triamcinolone cream (KENALOG) 0.1 % Apply 1 application topically 2 (two) times daily. To rash on legs (Patient taking differently: Apply 1 application  topically daily as needed (Eczema).)   valsartan (DIOVAN) 160 MG tablet Take 1 tablet (160 mg total) by mouth daily.    Allergies:   Patient has no known allergies.   Social History   Socioeconomic History   Marital status: Married    Spouse name: Not on file   Number of children: Not on file   Years of education: Not on file   Highest education level: Professional school degree (e.g., MD, DDS, DVM, JD)  Occupational History   Not on file  Tobacco Use   Smoking status: Never   Smokeless tobacco: Never  Vaping Use   Vaping status: Never Used  Substance and Sexual Activity   Alcohol use: Yes    Alcohol/week: 2.0 standard drinks of alcohol    Types: 2 Shots of liquor per week    Comment: 1 Margarita - Wed and Sat   Drug use: Never   Sexual activity: Not on file  Other Topics Concern   Not on file  Social History Narrative   Not on file   Social Drivers of Health   Financial Resource Strain: Low Risk  (11/03/2023)   Overall Financial Resource Strain (CARDIA)    Difficulty of  Paying Living Expenses: Not hard at all  Food Insecurity: No Food Insecurity (11/03/2023)   Hunger Vital Sign    Worried About Running Out of Food in the Last Year: Never true    Ran Out of Food in the Last Year: Never true  Transportation Needs: No Transportation Needs (11/03/2023)   PRAPARE - Administrator, Civil Service (Medical): No    Lack of Transportation (Non-Medical): No  Physical Activity: Unknown (11/03/2023)   Exercise Vital Sign    Days of Exercise per Week: 0 days  Minutes of Exercise per Session: Not on file  Stress: Stress Concern Present (11/03/2023)   [employer] of Occupational Health - Occupational Stress Questionnaire    Feeling of Stress : Rather much  Social Connections: Moderately Isolated (11/03/2023)   Social Connection and Isolation Panel [NHANES]    Frequency of Communication with Friends and Family: More than three times a week    Frequency of Social Gatherings with Friends and Family: More than three times a week    Attends Religious Services: Never    Database administrator or Organizations: No    Attends Engineer, structural: Not on file    Marital Status: Married     Family History:  The patient's family history includes Breast cancer (age of onset: 62) in her maternal aunt; Heart attack in her mother.  ROS:   12-point review of systems is negative unless otherwise noted in the HPI.   EKGs/Labs/Other Studies Reviewed:    Studies reviewed were summarized above. The additional studies were reviewed today:  LHC 03/17/2021 ([facility]): Single vessel coronary artery disease.  Prior LAD stents had mild ISR  Successful PCI of D2 (difficulty passing IVUS and Stent; required Guideliner)  IVUS imaging used for optimization   Recommendations:   Risk factor modification and secondary prevention measures  Aggressive medical therapy for CAD  Routine post-PCI care; sheath out, TR band applied  Refer for cardiac rehab in 2-3 weeks   Aspirin 81 mg lifelong; P2Y12 inhibitor for at least 12 months; Avoid elective surgery while receiving a P2Y12 inhibitor  __________  Treadmill MPI 10/27/2021 [facility]): Normal treadmill EKG without evidence of ischemia or arrhythmia  Small perfusion abnormality of mild intensity of apical myocardium  consistent with previous infarct and/or scar without evidence of  myocardial ischemia  __________  2D echo 05/12/2023: 1. Left ventricular ejection fraction, by estimation, is 45 to 50%. The  left ventricle has mildly decreased function. The left ventricle  demonstrates global hypokinesis. Left ventricular diastolic parameters are  consistent with Grade I diastolic  dysfunction (impaired relaxation). The average left ventricular global  longitudinal strain is -17.6 %. The global longitudinal strain is normal.   2. Right ventricular systolic function is normal. The right ventricular  size is normal. Tricuspid regurgitation signal is inadequate for assessing  PA pressure.   3. The mitral valve is normal in structure. Trivial mitral valve  regurgitation. No evidence of mitral stenosis.   4. The aortic valve has an indeterminant number of cusps. Aortic valve  regurgitation is mild. No aortic stenosis is present.   5. There is mild dilatation of the ascending aorta, measuring 44 mm.   6. The inferior vena cava is normal in size with greater than 50%  respiratory variability, suggesting right atrial pressure of 3 mmHg.  __________  [facility] 06/18/2023:   Prox RCA lesion is 30% stenosed.   Mid RCA lesion is 70% stenosed.   Mid RCA to Dist RCA lesion is 30% stenosed.   Prox LAD lesion is 30% stenosed.   1st Diag lesion is 10% stenosed.   Mid LAD lesion is 50% stenosed.   Mid LAD to Dist LAD lesion is 90% stenosed.   2nd Sept lesion is 100% stenosed.   1st RPL lesion is 60% stenosed.   1.  Moderately to severely calcified coronary arteries with significant two-vessel coronary artery disease.   Multiple overlapped stents noted in the mid to distal LAD with severe in-stent restenosis distally.  First diagonal stent is patent  with mild in-stent restenosis.  Proximal RCA stent is patent with mild in-stent restenosis.  There is heavily calcified 70% stenosis in the mid right coronary artery 2.  Left ventricular angiography was not performed.  EF was low normal by echo. 3.  Right heart catheterization showed normal filling pressures, normal pulmonary pressure and normal cardiac output.   Recommendations: Will discuss revascularization option with the patient and also during the heart team meeting.  Options include two-vessel CABG versus drug-coated balloon angioplasty to the LAD for in-stent restenosis and likely intravascular lithotripsy and drug eluting stent placement to the right coronary artery. __________  LHC 07/07/2023:   Prox RCA lesion is 30% stenosed.   Mid RCA to Dist RCA lesion is 30% stenosed.   1st RPL lesion is 60% stenosed.   Mid RCA lesion is 80% stenosed.   A drug-eluting stent was successfully placed using a SYNERGY XD 3.50X20.   Post intervention, there is a 0% residual stenosis.   Successful OCT guided intravascular lithotripsy and drug-eluting stent placement to the mid right coronary artery.   Recommendations: Continue indefinite dual antiplatelet therapy as tolerated. Staged LAD PCI with drug-coated balloon is recommended for in-stent restenosis. __________  LHC 10/20/2023:   1st Diag lesion is 10% stenosed.   2nd Sept lesion is 100% stenosed.   Prox LAD lesion is 90% stenosed.   Mid LAD to Dist LAD lesion is 90% stenosed.   Mid LAD lesion is 70% stenosed.   Drug-coated balloon angioplasty was performed using a BALL DC AGENT 3.50X15.   Drug-coated balloon angioplasty was performed using a BALL DC AGENT 3.0X30.   Drug-coated balloon angioplasty was performed using a BALL DC AGENT 3.0X30.   Post intervention, there is a 20% residual stenosis.   Post  intervention, there is a 15% residual stenosis.   Post intervention, there is a 20% residual stenosis.   1.  Significant progression of proximal LAD in-stent restenosis since September with severely underexpanded stent by IVUS.  In addition, progression of in-stent restenosis in the mid and distal LAD stents. 2.  Successful complex and very difficult Cutting Balloon angioplasty, intravascular lithotripsy to the proximal LAD and drug-coated balloon angioplasty to the whole stented segments.  Inability to deliver equipment distally without using a guide liner extension catheter.  The proximal LAD stent could not be fully dilated in spite of intravascular lithotripsy and high-pressure noncompliant balloon.   Recommendations: Continue indefinite dual antiplatelet therapy. If there is future restenosis in the LAD, recommend LIMA to LAD given that we already exhausted all the PCI options.    EKG:  EKG is ordered today.  The EKG ordered today demonstrates sinus bradycardia, 57 bpm, low voltage QRS, possible prior anterior infarct, nonspecific ST-T changes  Recent Labs: 12/22/2022: ALT 16; TSH 1.68 09/27/2023: Hemoglobin 13.4; Platelets 283 10/20/2023: BUN 15; Creatinine, Ser 0.96; Potassium 4.4; Sodium 137  Recent Lipid Panel    Component Value Date/Time   CHOL 116 12/22/2022 0000   CHOL 125 05/04/2019 0803   TRIG 92 12/22/2022 0000   HDL 47 12/22/2022 0000   HDL 39 (L) 05/04/2019 0803   CHOLHDL 3.2 05/04/2019 0803   LDLCALC 51 12/22/2022 0000   LDLCALC 66 05/04/2019 0803    PHYSICAL EXAM:    VS:  BP 110/78 (BP Location: Left Arm, Patient Position: Sitting, Cuff Size: Normal)   Pulse (!) 57   Ht 5\' 11"  (1.803 m)   Wt 157 lb 12.8 oz (71.6 kg)   SpO2 97%  BMI 22.01 kg/m   BMI: Body mass index is 22.01 kg/m.  Physical Exam Vitals reviewed.  Constitutional:      Appearance: She is well-developed.  HENT:     Head: Normocephalic and atraumatic.  Eyes:     General:        Right  eye: No discharge.        Left eye: No discharge.  Neck:     Vascular: No JVD.  Cardiovascular:     Rate and Rhythm: Normal rate and regular rhythm.     Heart sounds: Normal heart sounds, S1 normal and S2 normal. Heart sounds not distant. No midsystolic click and no opening snap. No murmur heard.    No friction rub.     Comments: Right radial arteriotomy site is well-healed without active bleeding, bruising, swelling, warmth, or erythema.  No tenderness to palpation.  Radial pulse 2+ proximal and distal to the arteriotomy site. Pulmonary:     Effort: Pulmonary effort is normal. No respiratory distress.     Breath sounds: Normal breath sounds. No decreased breath sounds, wheezing, rhonchi or rales.  Chest:     Chest wall: No tenderness.  Abdominal:     General: There is no distension.  Musculoskeletal:     Cervical back: Normal range of motion.  Skin:    General: Skin is warm and dry.     Nails: There is no clubbing.  Neurological:     Mental Status: She is alert and oriented to person, place, and time.  Psychiatric:        Speech: Speech normal.        Behavior: Behavior normal.        Thought Content: Thought content normal.        Judgment: Judgment normal.     Wt Readings from Last 3 Encounters:  11/03/23 157 lb 12.8 oz (71.6 kg)  10/20/23 155 lb (70.3 kg)  10/08/23 156 lb (70.8 kg)     ASSESSMENT & PLAN:   CAD involving the native coronary arteries with stable angina: She is doing very well and without symptoms concerning for angina or cardiac decompensation.  Continue aggressive risk factor modification and secondary prevention including aspirin 81 mg, clopidogrel 75 mg, amlodipine 5 mg, isosorbide mononitrate 30 mg, metoprolol succinate 25 mg, and rosuvastatin 40 mg.  Should she have progression of LAD stenosis moving forward, all PCI options have been exhausted and she would need to be considered for CABG with LIMA to LAD.  No post-cath complications.  HFmrEF secondary  to ICM: Euvolemic and well compensated.  Remains on Toprol-XL 25 mg and valsartan 160 mg.  Anticipate follow-up echo in several months time to reevaluate cardiomyopathy with escalation of GDMT as indicated.  HTN: Blood pressure is well controlled in the office today.  Pharmacotherapy as outlined above.  HLD: LDL 51 in 12/2022.  Target LDL less than 55.  Remains on rosuvastatin 40 mg.  Not currently fasting.  Anticipate fasting labs at next visit.     Disposition: F/u with Dr. Azucena Cecil or an APP in 2 months.   Medication Adjustments/Labs and Tests Ordered: Current medicines are reviewed at length with the patient today.  Concerns regarding medicines are outlined above. Medication changes, Labs and Tests ordered today are summarized above and listed in the Patient Instructions accessible in Encounters.   Signed, Eula Listen, PA-C 11/03/2023 12:56 PM     [facility] [place] [address] [place], [place] [address] [phone]

## 2023-11-03 ENCOUNTER — Encounter: Payer: Self-pay | Admitting: Family Medicine

## 2023-11-03 ENCOUNTER — Ambulatory Visit: Payer: Commercial Managed Care - PPO | Admitting: Family Medicine

## 2023-11-03 ENCOUNTER — Other Ambulatory Visit: Payer: Self-pay

## 2023-11-03 ENCOUNTER — Other Ambulatory Visit: Payer: Self-pay | Admitting: Internal Medicine

## 2023-11-03 ENCOUNTER — Ambulatory Visit: Payer: Commercial Managed Care - PPO | Attending: Physician Assistant | Admitting: Physician Assistant

## 2023-11-03 VITALS — BP 110/78 | HR 57 | Ht 71.0 in | Wt 157.8 lb

## 2023-11-03 DIAGNOSIS — F418 Other specified anxiety disorders: Secondary | ICD-10-CM | POA: Insufficient documentation

## 2023-11-03 DIAGNOSIS — E785 Hyperlipidemia, unspecified: Secondary | ICD-10-CM | POA: Diagnosis not present

## 2023-11-03 DIAGNOSIS — K623 Rectal prolapse: Secondary | ICD-10-CM | POA: Insufficient documentation

## 2023-11-03 DIAGNOSIS — I10 Essential (primary) hypertension: Secondary | ICD-10-CM | POA: Diagnosis not present

## 2023-11-03 DIAGNOSIS — Z79899 Other long term (current) drug therapy: Secondary | ICD-10-CM | POA: Diagnosis not present

## 2023-11-03 DIAGNOSIS — I5022 Chronic systolic (congestive) heart failure: Secondary | ICD-10-CM | POA: Diagnosis not present

## 2023-11-03 DIAGNOSIS — Z79890 Hormone replacement therapy (postmenopausal): Secondary | ICD-10-CM

## 2023-11-03 DIAGNOSIS — I25118 Atherosclerotic heart disease of native coronary artery with other forms of angina pectoris: Secondary | ICD-10-CM | POA: Diagnosis not present

## 2023-11-03 MED ORDER — SERTRALINE HCL 25 MG PO TABS
25.0000 mg | ORAL_TABLET | Freq: Every day | ORAL | 0 refills | Status: DC
  Filled 2023-11-03: qty 90, 90d supply, fill #0

## 2023-11-03 NOTE — Assessment & Plan Note (Addendum)
History of Present Illness The patient presents with symptoms of depression and anxiety. They have been experiencing decreased interest in reading novels and poor sleep. 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 They go to bed early but if awoken, has difficulty going back to sleep. There has been unintentional weight loss since September, with weight dropping from 194 pounds to 157 pounds, attributed to a lack of appetite associated with stress and anxiety. Appetite is starting to return.  Results DIAGNOSTIC PHQ-9: 15 (11/03/2023) GAD-7: 14 (11/03/2023)  Assessment and Plan Depression and Anxiety Significant weight loss, loss of appetite, and sleep disturbances. Elevated PHQ and GAD scores. Discussed the biological basis of these conditions and the impact on daily life and work. -Start Sertraline 25mg  daily. -Check-in via MyChart message in 4-6 weeks to assess response and adjust dosage if necessary.  Insomnia Difficulty maintaining sleep due to caregiving responsibilities. -Consider sleep hygiene measures and potential pharmacological interventions in the future if necessary.

## 2023-11-03 NOTE — Progress Notes (Signed)
     Primary Care / Sports Medicine Office Visit  Patient Information:  Patient ID: Kendra Salas, adult DOB: [birth year] Age: 73 y.o. MRN: [id number]   Kendra Salas is a pleasant 73 y.o. adult presenting with the following:  No chief complaint on file.   There were no vitals filed for this visit. There were no vitals filed for this visit. There is no height or weight on file to calculate BMI.     Independent interpretation of notes and tests performed by another provider:   None  Procedures performed:   None  Pertinent History, Exam, Impression, and Recommendations:   Problem List Items Addressed This Visit     Anxiety with depression - Primary   History of Present Illness The patient presents with symptoms of depression and anxiety. They have been experiencing decreased interest in reading novels and poor sleep. 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 They go to bed early but if awoken, has difficulty going back to sleep. There has been unintentional weight loss since September, with weight dropping from 194 pounds to 157 pounds, attributed to a lack of appetite associated with stress and anxiety. Appetite is starting to return.  Results DIAGNOSTIC PHQ-9: 15 (11/03/2023) GAD-7: 14 (11/03/2023)  Assessment and Plan Depression and Anxiety Significant weight loss, loss of appetite, and sleep disturbances. Elevated PHQ and GAD scores. Discussed the biological basis of these conditions and the impact on daily life and work. -Start Sertraline 25mg  daily. -Check-in via MyChart message in 4-6 weeks to assess response and adjust dosage if necessary.  Insomnia Difficulty maintaining sleep due to caregiving responsibilities. -Consider sleep hygiene measures and potential pharmacological interventions in the future if necessary.      Relevant Medications   sertraline (ZOLOFT) 25 MG tablet   Rectal prolapse   They have a history of  rectal prolapse and recent episodes of fecal incontinence, particularly after tough bowel movements. There is concern about potential accidents if not near a bathroom. 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  Rectal Prolapse History of rectal prolapse. Recent episodes of fecal incontinence. -Referral to Dr. Claudine Mouton for consultation.      Relevant Orders   Ambulatory referral to General Surgery     Orders & Medications Medications:  Meds ordered this encounter  Medications   sertraline (ZOLOFT) 25 MG tablet    Sig: Take 1 tablet (25 mg total) by mouth daily.    Dispense:  90 tablet    Refill:  0   Orders Placed This Encounter  Procedures   Ambulatory referral to General Surgery     No follow-ups on file.     Kendra Banana, MD, [facility]   Primary Care Sports Medicine Primary Care and Sports Medicine at [facility]

## 2023-11-03 NOTE — Patient Instructions (Signed)
Medication Instructions:  Your Physician recommend you continue on your current medication as directed.    *If you need a refill on your cardiac medications before your next appointment, please call your pharmacy*   Lab Work: Your provider would like for you to return in 2 months to have the following labs drawn: Fasting lipid panel and Liver function.   Please go to [facility] [address] ([facility], [place] [address] You do not need an appointment.  They are open from 8 am- 4:30 pm.  Lunch from 1:00 pm- 2:00 pm You DO need to be fasting.   You may also go to one of the following [facility]:  [address] [place], [place] [address] Phone: [phone] Lab hours: Mon-Fri 8 am- 5 pm    Lunch 12 pm- 1 pm  [address] [place],  [place]  [address]  [place] Phone: [phone] Lab hours: 7 am- 4 pm Lunch 12 pm-1 pm   [address] [place],  [place]  [address]  [place] Phone: [phone] Lab hours: Mon-Fri 8 am- 5 pm    Lunch 12 pm- 1 pm  If you have labs (blood work) drawn today and your tests are completely normal, you will receive your results only by: MyChart Message (if you have MyChart) OR A paper copy in the mail If you have any lab test that is abnormal or we need to change your treatment, we will call you to review the results.   Follow-Up: At [facility], you and your health needs are our priority.  As part of our continuing mission to provide you with exceptional heart care, we have created designated Provider Care Teams.  These Care Teams include your primary Cardiologist (physician) and Advanced Practice Providers (APPs -  Physician Assistants and Nurse Practitioners) who all work together to provide you with the care you need, when you need it.   Your next appointment:   2 month(s)  Provider:   You may see Debbe Odea, MD or one of the following Advanced Practice Providers on your designated Care Team:   Eula Listen,  [place]

## 2023-11-03 NOTE — Assessment & Plan Note (Signed)
They have a history of rectal prolapse and recent episodes of fecal incontinence, particularly after tough bowel movements. There is concern about potential accidents if not near a bathroom. 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  Rectal Prolapse History of rectal prolapse. Recent episodes of fecal incontinence. -Referral to Dr. Claudine Mouton for consultation.

## 2023-11-03 NOTE — Patient Instructions (Signed)
Patient Plan  1. Depression and Anxiety    - Begin taking Sertraline 25mg  daily to help manage symptoms.    - Contact us via MyCHart message in 4-6 weeks to assess your response and adjust the dosage if needed.  2. Rectal Prolapse    - You have been referred to Dr. Claudine Mouton for a consultation to explore further treatment options.  3. Insomnia    - Implement sleep hygiene measures to improve sleep quality (see below).    - We will evaluate the need for medication in the future if necessary.  Please contact us if you have any questions or need further assistance with these steps.  Sleep hygiene advice  When possible, maximize regularity in activity and sleep schedule   Regularity in the timing of sleep, food intake, and social activity helps to stabilize the biological clock.Minimizing discrepancies in sleep timing between on-shift and off-shift periods may help you adapt to a fixed-shift schedule and may also help you adapt to each shift type in a rotating-shift schedule (depending onrotation speed and direction).   Create a sleep-friendly bedroom environment   Make sure that your bed is comfortable and that your bedroom is dark, quiet, and cool (around 75F or 18C).Blackout shades may be particularly important to block sunlight during daytime sleep. Creating constantbackground noise in the sleep environment with a fan or humidifier, for example, will eliminate unexpectedsounds that would otherwise wake you up.   Limit exposure to bright light before daytime sleep   Exposure to bright light (eg, sunlight during the morning commute home following a night shift) can bealerting and may also set your biological clock to a time that interferes with daytime sleep.   Make the last hour before bed a "wind-down" time   Engage in relaxing and pleasant activities, dim or block light in the room, and have a light snack.   Do not use alcohol to help you sleep and do not consume alcohol too close  to bedtime   Although alcohol may help you to fall asleep more easily, it disrupts your sleep during the night by causingfrequent awakenings. One drink of alcohol should not be consumed within three hours of bedtime.   Smoking and other drugs will disrupt your sleep   If you smoke, do not smoke too close to bedtime or if you wake up during the intended sleep period. Mostdrugs of abuse can disrupt sleep.   Avoid caffeinated products within six hours of bedtime   In addition to coffee, these may include tea, chocolate, and many sodas.   Exercise regularly, but avoid activities that raise body temperature close to bedtime   Regular exercise can improve sleep quality, but exercising or having a warm bath too close to bedtime candisrupt your ability to fall asleep. Warm baths should be avoided within 1.5 hours of bedtime.   Avoid consuming more than 8 to 10 ounces of liquids close to bedtime   A full or semi-full bladder can contribute to awakenings. Restrict liquids close to bedtime and empty yourbladder just before going to bed.

## 2023-11-04 ENCOUNTER — Other Ambulatory Visit: Payer: Self-pay

## 2023-11-04 ENCOUNTER — Other Ambulatory Visit: Payer: Self-pay | Admitting: Internal Medicine

## 2023-11-04 DIAGNOSIS — Z79890 Hormone replacement therapy (postmenopausal): Secondary | ICD-10-CM

## 2023-11-04 MED FILL — Finasteride Tab 5 MG: ORAL | 90 days supply | Qty: 90 | Fill #0 | Status: AC

## 2023-11-09 ENCOUNTER — Other Ambulatory Visit: Payer: Self-pay

## 2023-11-09 NOTE — Progress Notes (Signed)
 Specialty Pharmacy Refill Coordination Note  Kendra Salas is a 73 y.o. adult contacted today regarding refills of specialty medication(s) Dupilumab  (Dupixent )   Patient requested Delivery   Delivery date: 11/23/23   Verified address: [address]   [place] [address]   Medication will be filled on 11/22/23.

## 2023-11-11 ENCOUNTER — Ambulatory Visit: Payer: Commercial Managed Care - PPO | Admitting: Surgery

## 2023-11-18 ENCOUNTER — Other Ambulatory Visit: Payer: Self-pay

## 2023-11-18 ENCOUNTER — Encounter: Payer: Self-pay | Admitting: Family Medicine

## 2023-11-18 ENCOUNTER — Other Ambulatory Visit: Payer: Self-pay | Admitting: Family Medicine

## 2023-11-18 DIAGNOSIS — F418 Other specified anxiety disorders: Secondary | ICD-10-CM

## 2023-11-18 NOTE — Telephone Encounter (Signed)
Please review. Thank you JM

## 2023-11-19 ENCOUNTER — Other Ambulatory Visit: Payer: Self-pay

## 2023-11-19 ENCOUNTER — Other Ambulatory Visit: Payer: Self-pay | Admitting: Internal Medicine

## 2023-11-19 DIAGNOSIS — F418 Other specified anxiety disorders: Secondary | ICD-10-CM

## 2023-11-19 MED ORDER — SERTRALINE HCL 50 MG PO TABS
50.0000 mg | ORAL_TABLET | Freq: Every day | ORAL | 0 refills | Status: DC
  Filled 2023-11-19: qty 90, 90d supply, fill #0

## 2023-11-22 ENCOUNTER — Other Ambulatory Visit: Payer: Self-pay

## 2023-12-05 ENCOUNTER — Other Ambulatory Visit: Payer: Self-pay

## 2023-12-05 ENCOUNTER — Other Ambulatory Visit: Payer: Self-pay | Admitting: Internal Medicine

## 2023-12-05 DIAGNOSIS — I25118 Atherosclerotic heart disease of native coronary artery with other forms of angina pectoris: Secondary | ICD-10-CM

## 2023-12-06 ENCOUNTER — Other Ambulatory Visit: Payer: Self-pay

## 2023-12-07 ENCOUNTER — Other Ambulatory Visit: Payer: Self-pay

## 2023-12-07 DIAGNOSIS — K219 Gastro-esophageal reflux disease without esophagitis: Secondary | ICD-10-CM

## 2023-12-07 DIAGNOSIS — I10 Essential (primary) hypertension: Secondary | ICD-10-CM

## 2023-12-07 DIAGNOSIS — I25118 Atherosclerotic heart disease of native coronary artery with other forms of angina pectoris: Secondary | ICD-10-CM

## 2023-12-07 DIAGNOSIS — Z79890 Hormone replacement therapy (postmenopausal): Secondary | ICD-10-CM

## 2023-12-07 MED ORDER — AMLODIPINE BESYLATE 5 MG PO TABS
5.0000 mg | ORAL_TABLET | Freq: Every day | ORAL | 1 refills | Status: AC
  Filled 2023-12-07 – 2024-01-02 (×2): qty 90, 90d supply, fill #0
  Filled 2024-02-03: qty 90, 90d supply, fill #1

## 2023-12-07 MED ORDER — FINASTERIDE 5 MG PO TABS
5.0000 mg | ORAL_TABLET | Freq: Every day | ORAL | 1 refills | Status: AC
  Filled 2023-12-07 – 2024-02-03 (×2): qty 90, 90d supply, fill #0

## 2023-12-07 MED ORDER — ROSUVASTATIN CALCIUM 40 MG PO TABS
40.0000 mg | ORAL_TABLET | Freq: Every day | ORAL | 1 refills | Status: AC
  Filled 2023-12-07: qty 90, 90d supply, fill #0

## 2023-12-07 MED ORDER — ISOSORBIDE MONONITRATE ER 30 MG PO TB24
30.0000 mg | ORAL_TABLET | Freq: Every day | ORAL | 1 refills | Status: AC
  Filled 2023-12-07: qty 90, 90d supply, fill #0

## 2023-12-07 MED ORDER — PANTOPRAZOLE SODIUM 40 MG PO TBEC
40.0000 mg | DELAYED_RELEASE_TABLET | Freq: Every day | ORAL | 1 refills | Status: AC
  Filled 2023-12-07 – 2024-02-18 (×2): qty 90, 90d supply, fill #0

## 2023-12-07 MED FILL — Clopidogrel Bisulfate Tab 75 MG (Base Equiv): ORAL | 90 days supply | Qty: 90 | Fill #0 | Status: AC

## 2023-12-07 NOTE — Telephone Encounter (Signed)
 LOV 11/03/2023.  Plavix pended on 12/05/2023 #90, 1 refill.   All criteria met  So rx sent for refil  Requested Prescriptions  Pending Prescriptions Disp Refills   clopidogrel (PLAVIX) 75 MG tablet 90 tablet 1    Sig: Take 1 tablet (75 mg total) by mouth daily. OFFICE VISIT NEEDED FOR ADDITIONAL REFILLS     Hematology: Antiplatelets - clopidogrel Passed - 12/07/2023 10:05 AM      Passed - HCT in normal range and within 180 days    Hematocrit  Date Value Ref Range Status  09/27/2023 41.2 34.0 - 46.6 % Final         Passed - HGB in normal range and within 180 days    Hemoglobin  Date Value Ref Range Status  09/27/2023 13.4 11.1 - 15.9 g/dL Final         Passed - PLT in normal range and within 180 days    Platelets  Date Value Ref Range Status  09/27/2023 283 150 - 450 x10E3/uL Final         Passed - Cr in normal range and within 360 days    Creatinine  Date Value Ref Range Status  10/16/2014 0.85 0.60 - 1.30 mg/dL Final   Creatinine, Ser  Date Value Ref Range Status  10/20/2023 0.96 0.44 - 1.00 mg/dL Final         Passed - Valid encounter within last 6 months    Recent Outpatient Visits           1 month ago Anxiety with depression   [place] [facility], Ocie Bob, MD   1 month ago Encounter for medication review   [facility] [place] Yehuda Savannah L, RPH-CPP   10 months ago Benign essential HTN   [facility] at [facility], Nyoka Cowden, MD   1 year ago Encounter for medication review   [facility] [place] Drucilla Chalet, RPH-CPP   2 years ago Acute cough   [facility] at [facility], Nyoka Cowden, MD       Future Appointments             In 1 week Deirdre Evener, MD [facility]   In 4  weeks Debbe Odea, MD [facility] [place]   In 2 months Judithann Graves Nyoka Cowden, MD [facility] Primary Care & Sports Medicine at [facility]   In 6 months Deirdre Evener, MD [facility]

## 2023-12-08 ENCOUNTER — Other Ambulatory Visit: Payer: Self-pay

## 2023-12-08 DIAGNOSIS — L209 Atopic dermatitis, unspecified: Secondary | ICD-10-CM | POA: Diagnosis not present

## 2023-12-08 DIAGNOSIS — I25118 Atherosclerotic heart disease of native coronary artery with other forms of angina pectoris: Secondary | ICD-10-CM | POA: Diagnosis not present

## 2023-12-08 DIAGNOSIS — K6289 Other specified diseases of anus and rectum: Secondary | ICD-10-CM | POA: Diagnosis not present

## 2023-12-08 DIAGNOSIS — D6869 Other thrombophilia: Secondary | ICD-10-CM | POA: Diagnosis not present

## 2023-12-08 DIAGNOSIS — F418 Other specified anxiety disorders: Secondary | ICD-10-CM | POA: Diagnosis not present

## 2023-12-08 DIAGNOSIS — E782 Mixed hyperlipidemia: Secondary | ICD-10-CM | POA: Diagnosis not present

## 2023-12-08 DIAGNOSIS — K219 Gastro-esophageal reflux disease without esophagitis: Secondary | ICD-10-CM | POA: Diagnosis not present

## 2023-12-08 DIAGNOSIS — Z125 Encounter for screening for malignant neoplasm of prostate: Secondary | ICD-10-CM | POA: Diagnosis not present

## 2023-12-08 DIAGNOSIS — C8334 Diffuse large B-cell lymphoma, lymph nodes of axilla and upper limb: Secondary | ICD-10-CM | POA: Diagnosis not present

## 2023-12-08 DIAGNOSIS — I10 Essential (primary) hypertension: Secondary | ICD-10-CM | POA: Diagnosis not present

## 2023-12-09 ENCOUNTER — Other Ambulatory Visit (HOSPITAL_COMMUNITY): Payer: Self-pay

## 2023-12-15 ENCOUNTER — Other Ambulatory Visit: Payer: Self-pay

## 2023-12-15 ENCOUNTER — Other Ambulatory Visit: Payer: Self-pay | Admitting: Pharmacist

## 2023-12-15 ENCOUNTER — Encounter: Payer: Self-pay | Admitting: Dermatology

## 2023-12-15 ENCOUNTER — Ambulatory Visit: Payer: Commercial Managed Care - PPO | Admitting: Dermatology

## 2023-12-15 DIAGNOSIS — L659 Nonscarring hair loss, unspecified: Secondary | ICD-10-CM | POA: Diagnosis not present

## 2023-12-15 DIAGNOSIS — L2081 Atopic neurodermatitis: Secondary | ICD-10-CM | POA: Diagnosis not present

## 2023-12-15 DIAGNOSIS — Z7189 Other specified counseling: Secondary | ICD-10-CM

## 2023-12-15 DIAGNOSIS — Z79899 Other long term (current) drug therapy: Secondary | ICD-10-CM

## 2023-12-15 MED ORDER — DUPIXENT 300 MG/2ML ~~LOC~~ SOSY
300.0000 mg | PREFILLED_SYRINGE | SUBCUTANEOUS | 11 refills | Status: DC
  Filled 2023-12-15 – 2023-12-21 (×2): qty 4, 28d supply, fill #0
  Filled 2024-01-18: qty 4, 28d supply, fill #1
  Filled 2024-02-15: qty 4, 28d supply, fill #2

## 2023-12-15 MED ORDER — DUPIXENT 300 MG/2ML ~~LOC~~ SOSY
300.0000 mg | PREFILLED_SYRINGE | SUBCUTANEOUS | 11 refills | Status: DC
  Filled 2023-12-15: qty 4, 28d supply, fill #0

## 2023-12-15 MED ORDER — BIMATOPROST 0.03 % EX SOLN
1.0000 | Freq: Every day | CUTANEOUS | 12 refills | Status: DC
  Filled 2023-12-15: qty 5, 30d supply, fill #0
  Filled 2024-03-02: qty 5, 30d supply, fill #1

## 2023-12-15 NOTE — Patient Instructions (Signed)

## 2023-12-15 NOTE — Progress Notes (Signed)
   Follow-Up Visit   Subjective  Kendra Salas is a 73 y.o. adult who presents for the following: Atopic Derm 19m f/u, trunk, extremities, Dupixent 300mg /39ml sq injections q 2 wks, Opzelura cr prn, TMC 0.1% cr prn The patient has spots, moles and lesions to be evaluated, some may be new or changing and the patient may have concern these could be cancer.  The following portions of the chart were reviewed this encounter and updated as appropriate: medications, allergies, medical history  Review of Systems:  No other skin or systemic complaints except as noted in HPI or Assessment and Plan.  Objective  Well appearing patient in no apparent distress; mood and affect are within normal limits.  A focused examination was performed of the following areas: Face, back, buttocks  Relevant exam findings are noted in the Assessment and Plan.   Assessment & Plan   ATOPIC NEURODERMATITIS Trunk, extremities, buttocks - severe Clear today on systemic Dupixent treatment Patient says "it's a godsend" Exam: sacral area clear, low back clear 0% BSA on treatment Chronic condition with duration or expected duration over one year. Currently well-controlled. Atopic dermatitis - Severe, on Dupixent (biologic medication).  Atopic dermatitis (eczema) is a chronic, relapsing, pruritic condition that can significantly affect quality of life. It is often associated with allergic rhinitis and/or asthma and can require treatment with topical medications, phototherapy, or in severe cases biologic medications, which require long term medication management.   Treatment Plan: Cont Dupixent 300mg /95ml sq injections q 2 wks Cont Opzelura cr qd prn flares Cont TMC 0.1% cr to more severe areas prn flares, avoid f/g/a  Recommend gentle skin care.   Dupilumab (Dupixent) is a treatment given by injection for adults and children with moderate-to-severe atopic dermatitis. Goal is control of skin condition, not cure. It is  given as 2 injections at the first dose followed by 1 injection ever 2 weeks thereafter.  Young children are dosed monthly.  Potential side effects include allergic reaction, herpes infections, injection site reactions and conjunctivitis (inflammation of the eyes).  The use of Dupixent requires long term medication management, including periodic office visits.  Long term medication management.  Patient is using long term (months to years) prescription medication  to control their dermatologic condition.  These medications require periodic monitoring to evaluate for efficacy and side effects and may require periodic laboratory monitoring.  Topical steroids (such as triamcinolone, fluocinolone, fluocinonide, mometasone, clobetasol, halobetasol, betamethasone, hydrocortisone) can cause thinning and lightening of the skin if they are used for too long in the same area. Your physician has selected the right strength medicine for your problem and area affected on the body. Please use your medication only as directed by your physician to prevent side effects. ATOPIC NEURODERMATITIS   Related Medications Ruxolitinib Phosphate (OPZELURA) 1.5 % CREA Apply 1 Application topically as directed. Qd to aa eczema COUNSELING AND COORDINATION OF CARE   MEDICATION MANAGEMENT   LONG-TERM USE OF HIGH-RISK MEDICATION   HYPOTRICHOSIS    HYPOTRICHOSIS eyelashes Exam: thin eyelashes Treatment Plan: Restart Latisse qhs as directed to upper eyelids  Return for as scheduled for TBSE and f/u Atopic Derm.  I, Ardis Rowan, RMA, am acting as scribe for Armida Sans, MD .   Documentation: I have reviewed the above documentation for accuracy and completeness, and I agree with the above.  Armida Sans, MD

## 2023-12-16 ENCOUNTER — Other Ambulatory Visit: Payer: Self-pay | Admitting: Pharmacist

## 2023-12-21 ENCOUNTER — Other Ambulatory Visit: Payer: Self-pay | Admitting: Pharmacy Technician

## 2023-12-21 ENCOUNTER — Other Ambulatory Visit: Payer: Self-pay

## 2023-12-21 ENCOUNTER — Other Ambulatory Visit (HOSPITAL_COMMUNITY): Payer: Self-pay

## 2023-12-21 NOTE — Progress Notes (Signed)
 Specialty Pharmacy Refill Coordination Note  Kendra Salas is a 73 y.o. adult contacted today regarding refills of specialty medication(s) Dupilumab (Dupixent)   Patient requested (Patient-Rptd) Delivery   Delivery date: (Patient-Rptd) 12/29/23   Verified address: (Patient-Rptd) [address] [place] [address]   Medication will be filled on 12/28/23.

## 2023-12-22 ENCOUNTER — Encounter: Payer: Self-pay | Admitting: Physical Therapy

## 2023-12-22 ENCOUNTER — Ambulatory Visit: Attending: Surgery | Admitting: Physical Therapy

## 2023-12-22 DIAGNOSIS — R2689 Other abnormalities of gait and mobility: Secondary | ICD-10-CM | POA: Diagnosis not present

## 2023-12-22 DIAGNOSIS — M533 Sacrococcygeal disorders, not elsewhere classified: Secondary | ICD-10-CM | POA: Diagnosis not present

## 2023-12-22 DIAGNOSIS — E871 Hypo-osmolality and hyponatremia: Secondary | ICD-10-CM | POA: Diagnosis not present

## 2023-12-22 DIAGNOSIS — M4004 Postural kyphosis, thoracic region: Secondary | ICD-10-CM | POA: Insufficient documentation

## 2023-12-22 NOTE — Therapy (Addendum)
 OUTPATIENT PHYSICAL THERAPY EVALUATION   Patient Name: Kendra Salas MRN: [id number] DOB:[birth year], 73 y.o., adult Today's Date: 12/22/2023   PT End of Session - 12/22/23 0823     Visit Number 1    Number of Visits 10    Date for PT Re-Evaluation 03/01/24    PT Start Time 0805    PT Stop Time 0845    PT Time Calculation (min) 40 min    Activity Tolerance Patient tolerated treatment well;No increased pain    Behavior During Therapy [facility] for tasks assessed/performed             Past Medical History:  Diagnosis Date   Anxiety    Basal cell carcinoma 03/07/2008   left dorsum mid forearm   Basal cell carcinoma 04/19/2013   left distal lat ant thigh   Basal cell carcinoma 04/11/2014   right forearm, right post shoulder near deltoid   Basal cell carcinoma 10/12/2018   left mid back 7.0 cm lat to spine   Basal cell carcinoma 06/16/2023   R forehead, ED&C   CAD (coronary artery disease)    Clotting disorder (HCC)    Dysplastic nevus 02/02/2007   left volar mid to prox forearm   Dysplastic nevus 07/09/2010   right medial breast 1.5 cm med to areola, right med breast parasternal, right post distal deltoid   Eczema    GERD (gastroesophageal reflux disease)    Hyperlipemia    Hypertension    Lymphoma (HCC) 2015   Melanoma in situ (HCC) 12/22/2006   right medial breast, med sup quad   Personal history of chemotherapy 2017   6 months of chemo   Past Surgical History:  Procedure Laterality Date   AUGMENTATION MAMMAPLASTY Bilateral 2003   CARDIAC CATHETERIZATION     X3 - has 9 stents. 4 LAD, 1 posterior, Most recent cath 03/2021   COLONOSCOPY  06/29/2012   repeat 10 yrs - Dr. Bluford Kaufmann   COLONOSCOPY WITH PROPOFOL N/A 08/07/2022   Procedure: COLONOSCOPY WITH PROPOFOL;  Surgeon: Midge Minium, MD;  Location: [facility];  Service: Endoscopy;  Laterality: N/A;  needs to be first case   CORONARY IMAGING/OCT N/A 07/07/2023   Procedure: CORONARY IMAGING/OCT;  Surgeon: Iran Ouch, MD;  Location: [facility];  Service: Cardiovascular;  Laterality: N/A;   CORONARY LITHOTRIPSY N/A 07/07/2023   Procedure: CORONARY LITHOTRIPSY;  Surgeon: Iran Ouch, MD;  Location: [facility];  Service: Cardiovascular;  Laterality: N/A;   CORONARY LITHOTRIPSY N/A 10/20/2023   Procedure: CORONARY LITHOTRIPSY;  Surgeon: Iran Ouch, MD;  Location: [facility];  Service: Cardiovascular;  Laterality: N/A;   CORONARY STENT INTERVENTION N/A 07/07/2023   Procedure: CORONARY STENT INTERVENTION;  Surgeon: Iran Ouch, MD;  Location: [facility];  Service: Cardiovascular;  Laterality: N/A;   CORONARY STENT INTERVENTION N/A 10/20/2023   Procedure: CORONARY STENT INTERVENTION;  Surgeon: Iran Ouch, MD;  Location: [facility];  Service: Cardiovascular;  Laterality: N/A;   CORONARY ULTRASOUND/IVUS N/A 10/20/2023   Procedure: Coronary Ultrasound/IVUS;  Surgeon: Iran Ouch, MD;  Location: [facility];  Service: Cardiovascular;  Laterality: N/A;   ESOPHAGOGASTRODUODENOSCOPY (EGD) WITH PROPOFOL N/A 11/22/2015   Procedure: ESOPHAGOGASTRODUODENOSCOPY (EGD) WITH PROPOFOL;  Surgeon: Midge Minium, MD;  Location: [facility];  Service: Endoscopy;  Laterality: N/A;   FACIAL COSMETIC SURGERY     gender re-affirmation     HERNIA REPAIR  x2   POLYPECTOMY  08/07/2022   Procedure: POLYPECTOMY;  Surgeon: Midge Minium, MD;  Location: [facility];  Service: Endoscopy;;   PORTA CATH REMOVAL N/A 04/10/2019   Procedure: PORTA CATH REMOVAL;  Surgeon: Annice Needy, MD;  Location: [facility];  Service: Cardiovascular;  Laterality: N/A;   RIGHT/LEFT HEART CATH AND CORONARY ANGIOGRAPHY Bilateral 06/18/2023   Procedure: RIGHT/LEFT HEART CATH AND CORONARY ANGIOGRAPHY;  Surgeon: Iran Ouch, MD;  Location: [facility];  Service: Cardiovascular;  Laterality: Bilateral;   Patient Active Problem List   Diagnosis  Date Noted   Anxiety with depression 11/03/2023   Rectal prolapse 11/03/2023   Chest pain 07/07/2023   Acquired thrombophilia (HCC) 01/12/2023   Polyp of descending colon 08/07/2022   S/P angioplasty with stent 03/17/2021   Hx of melanoma of skin 05/22/2020   Psoriasis 03/30/2018   Mixed hyperlipidemia 03/31/2016   Dyspnea on exertion 03/31/2016   Screen for colon cancer    Benign neoplasm of ascending colon    CAD (coronary artery disease) 03/03/2015   Diverticulosis 03/03/2015   Benign essential HTN 03/03/2015   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 Since last week, pt cut back 2 sodas and started drinking 2 - 16 fl oz water,                                                                                                                                                                                      SUBJECTIVE STATEMENT ON EVAL 12/22/23:  1) rectal prolapse:  pt notices it 6 months ago   Pt is manually assisting to place rectal prolapse every hour ,   2) Straining with bowel movements:   uses Metamucil and Miralax and scheduling bowel movement morning 6:30am ,  With laxative: Stool Type  Type 4 , before Type 5 . Initially need to strain with each bowel movement , pt has to stretch.  Pt is scheduling and asking herself to have a BM in the morning. It takes 15 min. No issues with prolapse blocking bowel movements.   Pt has not met with nutritionist. Pt is eating more  cereal  and raisin bran. Daily fluid intake: 8-12 fl oz water, diet coke 80 fl oz, 8 fl oz ice tea. Pt does not have nocturia.  Pt goes to urinate in the middle of the night before going back to bed  Urination frequency is more related to "bladder shy" , tries to evacuate often, once every 2 hours  Medical Hx:  2003 gender affirmation surgery,pt was dilating her neovagina but reports it is not deep as it used to be  Hx of CAD with 10 cardiac stents , 8 are in LAD, 2  Walk 10 min at lunch  Did not cardiac rehab   Hodgins Lymphoma with chemo Tx 6 years ago. No changes to skin tissue at pelvic floor.    Fitness routine consists of 10 min walk during lunch break  Sleep cycle interrupted: Goes to bed at 8pm , gets up at midnight to assist with spouse with hoyer lift  from [facility]. Which pt has had to do for the past 8 years   Charchot-Marie-tooth disease.  She is IND with bathroom. 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 One of her patient room, she turns R and the other room L.      PERTINENT HISTORY:  See above  PAIN:  Are you having pain? No   PRECAUTIONS: No   WEIGHT BEARING RESTRICTIONS: No   FALLS:  Has patient fallen in last 6 months? No  LIVING ENVIRONMENT:  Lives with: spouse Lives in: 2 story , ramp to enter front door  Stairs:  2 flights  , 16 steps total  Has following equipment at home: for spouse uses a Nurse, adult    OCCUPATION: physician   PLOF: IND   PATIENT GOALS:  "I do not want any accidents" . Miralax and fiber has improved that.  Pt os making the effort at 6am - 6:30am for bowel movement without straining.  "Keep rectal prolapse within me and not having to check on it every hour"    OBJECTIVE:                                                    [facility] PT Assessment -      Observation/Other Assessments   Observations  slumped posture, thoracic kyphosis, legs crossed    Scoliosis R shoulder, iliac crest lowered , R lumbar curve      Coordination   Coordination and Movement Description chest breathing      Sit to Stand   Comments narrow BOS      Posture/Postural Control   Posture Comments plan to assess earlobe to wall to measure thoracic kyphosis./ forward head :      AROM   Overall AROM Comments plan to assess      Strength   Overall Strength Comments plan to assess      Ambulation/Gait   Gait Comments 1.48 ms ( decreased R stance, minimal L posterior thoracic rotation)   Plan to assess 6 MWT             [facility] Adult PT Treatment/Exercise -       Self-Care   Self-Care Other Self-Care Comments  Other Self-Care Comments  explained role of posture and alignment to address before addressing pelvic floor, active listening and motivational interviewing to understand pt's goals, behavorial habits related to bladder and bowel function, medical and surgercial Hx, past Hx of hodgkin lymphoma and chemo Tx, assisting with spouse using hoyer lift every night , active listening to pt's interrupted sleep pattern, busy schedule as a physican, discussed ergonomics at work      Neuro Re-ed    Neuro Re-ed Details  cued for body mechanics in sitting and sit to stand to minimize straining               HOME EXERCISE PROGRAM: See pt instruction section    ASSESSMENT:  CLINICAL IMPRESSION: Pt is a 73  yo  who presents with  following issues which impact QOL, ADL,  fitness, social and community activities:    rectal prolapse straining with bowel movements:   Pt's musculoskeletal assessment revealed uneven pelvic girdle and shoulder height, forward head and thoracic kyphosis, asymmetries to gait pattern, limited spinal /pelvic mobility, dyscoordination and strength of pelvic floor mm, poor body mechanics which places strain on the abdominal/pelvic floor mm. These are deficits that indicate an  ineffective intraabdominal pressure system associated with increased risk for pt's Sx.   Pt was provided education on etiology of Sx with anatomy, physiology explanation with images along with the benefits of customized pelvic PT Tx based on pt's medical conditions and musculoskeletal deficits.  Explained the physiology of deep core mm coordination and roles of pelvic floor function in urination, defecation, sexual function, and postural control with deep core mm system.   Regional interdependent approaches will yield greater benefits in pt's POC.    Medical Hx are pertinent to Sx:  2003 gender affirmation surgery,pt was dilating her neovagina but reports it is not deep as it used to be Hx of CAD with 10 cardiac stents , 8 are in LAD, 2  Walk 10 min at lunch ( limited physical routine)  Hodgins Lymphoma with chemo Tx 6 years ago. No changes to skin tissue at pelvic floor.   Interrupted sleep pattern due to helping spouse middle of night with hoyer lift   Following Tx today which pt tolerated without complaints,  pt demo'd proper body mechanics to minimize straining pelvic floor.   Plan to address realignment of spine/ pelvis at next session to help promote optimize IAP system for improved pelvic floor function, trunk stability, gait, balance, stabilization with mobility tasks.  Plan to address pelvic floor issues once pelvis and spine are realigned to yield better outcomes.  Plan to administer PSDI outcome measure, assess forward head posture/ thoracic kyphosis for objective measure at next session.  Pt benefits from skilled PT.   OBJECTIVE IMPAIRMENTS decreased activity tolerance, decreased coordination, decreased endurance, decreased mobility, difficulty walking, decreased ROM, decreased strength, decreased safety awareness, hypomobility, increased muscle spasms, impaired flexibility, improper body mechanics, postural dysfunction,   ACTIVITY LIMITATIONS  self-care,  sleep, home chores, work  tasks    PARTICIPATION LIMITATIONS:  community    PERSONAL FACTORS   interrupted sleep, low water intake, high soda intake, posture are also affecting patient's functional outcome.    REHAB POTENTIAL: Good   CLINICAL DECISION MAKING:  High complexity   EVALUATION COMPLEXITY: High    PATIENT EDUCATION:    Education details: Showed pt anatomy images. Explained muscles attachments/ connection, physiology of deep core system/ spinal- thoracic-pelvis-lower kinetic chain as they relate to pt's presentation, Sx, and past Hx.  Explained what and how these areas of deficits need to be restored to balance and function    See Therapeutic activity / neuromuscular re-education section  Answered pt's questions.   Person educated: Patient Education method: Explanation, Demonstration, Tactile cues, Verbal cues, and Handouts Education comprehension: verbalized understanding, returned demonstration, verbal cues required, tactile cues required, and needs further education     PLAN: PT FREQUENCY: 1x/week   PT DURATION: 10 weeks   PLANNED INTERVENTIONS: Therapeutic exercises, Therapeutic activity, Neuromuscular re-education, Balance training, Gait training, Patient/Family education, Self Care, Joint mobilization, Spinal mobilization, Moist heat, Taping, and Manual therapy, dry needling.   PLAN FOR NEXT SESSION: See clinical impression for plan     GOALS: Goals reviewed with patient? Yes  SHORT TERM GOALS: Target date: 01/19/2024    Pt will demo IND with HEP                    Baseline: Not IND            Goal status: INITIAL   LONG TERM GOALS: Target date: 03/01/2024    1.Pt will demo proper deep core coordination without chest breathing and optimal excursion of diaphragm/pelvic floor in order to promote spinal stability and pelvic floor function  Baseline: dyscoordination Goal status: INITIAL  2.  Pt will demo proper body mechanics in against gravity tasks and ADLs ,  work tasks,  fitness  to minimize straining pelvic floor / back    Baseline: not IND, improper form that places strain on pelvic floor  Goal status: INITIAL  3. Pt will demo reciprocal gait pattern, longer stride length  in order to ambulate safely in community and having a improved walking routine  Baseline:  decreased R stance, minimal L posterior thoracic rotation Goal status: INITIAL    4.  Pt will report decreased manually assisting to place rectal prolapse to once every 2 hours or decreased frequency by 50% per day  Baseline:  Pt is manually assisting to place rectal prolapse every hour ,  Goal status: INITIAL   5. Pt will be compliant with increased water intake and decreased soda, ice tea to promote bladder and bladder health  Baseline:  Daily fluid intake: 8-12 fl oz water, diet coke 80 fl oz, 8 fl oz ice tea.  Goal status: INITIAL  6. Pt will report no more straining with BMs  > 75% of the time  in order to minimize worsening prolapse  Baseline: Initially need to strain with each bowel movement , pt has to stretch.  Pt is scheduling and asking herself to have a BM in the morning. It takes 15 min. Goal status: INITIAL   7. Pt will demo improved score by > 2 pt change with [place] Sleep Disorder Index to improve sleep hygiene , improve immune functions as a cancer survivor  Baseline:  plan to assess  Goal Status : INITIAL   8. Pt will demo improved upright posture with decreased distance from earlobe to wall , demo less thoracic kyphosis and will be compliant with HEP for strenghtening deep core and posterior deep musculature for cervico-scapulo-thoracic-lumbar -glut LKC chain in order to promote cardiovascular function ( Hx of stents, CAD)  and promote improved sleep positioning  Baseline:  plan to assess  Goal Status : INITIAL    9. Pt will improve distance on > 1000 ft to improve her walking routine of 10 min with improved cardio function, build core mm with maintaining walking  routine  Baseline:   plan to assess  , Perceived Rate of Exertion score  Goal Status : INITIAL       Mariane Masters, PT 12/22/2023, 8:38 AM

## 2023-12-22 NOTE — Patient Instructions (Addendum)
   Proper body mechanics with getting out of a chair to decrease strain  on back &pelvic floor   Avoid holding your breath when Getting out of the chair:  Scoot to front part of chair  Heels behind knees, feet are hip width apart, nose over toes  Inhale like you are smelling roses / "smelling good soup" quiet breath for diaphragm excursion ( not up into chest and shoulders)  Exhale to stand  ___  Sitting with feet on ground, four points of contact Catch yourself crossing ankles and thighs  __  Increase water intake from 8-12 fl oz to 24 fl oz, decrease diet soda from 84 fl oz to 60 fl oz. to improve bladder and bowel health and to minimize straining

## 2023-12-24 ENCOUNTER — Ambulatory Visit: Admitting: Internal Medicine

## 2023-12-27 NOTE — Addendum Note (Signed)
 Addended by: Mariane Masters on: 12/27/2023 11:49 AM   Modules accepted: Orders

## 2023-12-28 ENCOUNTER — Other Ambulatory Visit: Payer: Self-pay

## 2023-12-29 ENCOUNTER — Encounter: Payer: Self-pay | Admitting: Physical Therapy

## 2023-12-29 ENCOUNTER — Ambulatory Visit: Admitting: Physical Therapy

## 2023-12-29 DIAGNOSIS — R2689 Other abnormalities of gait and mobility: Secondary | ICD-10-CM

## 2023-12-29 DIAGNOSIS — M533 Sacrococcygeal disorders, not elsewhere classified: Secondary | ICD-10-CM

## 2023-12-29 DIAGNOSIS — M4004 Postural kyphosis, thoracic region: Secondary | ICD-10-CM | POA: Diagnosis not present

## 2023-12-29 NOTE — Therapy (Signed)
 OUTPATIENT PHYSICAL THERAPY TREATMENT    Patient Name: Kendra Salas MRN: [id number] DOB:1950/10/19, 73 y.o., adult Today's Date: 12/29/2023   PT End of Session - 12/29/23 0806     Visit Number 2    Number of Visits 10    Date for PT Re-Evaluation 03/01/24    PT Start Time 0802    PT Stop Time 0845    PT Time Calculation (min) 43 min    Activity Tolerance Patient tolerated treatment well;No increased pain    Behavior During Therapy [facility] for tasks assessed/performed             Past Medical History:  Diagnosis Date   Anxiety    Basal cell carcinoma 03/07/2008   left dorsum mid forearm   Basal cell carcinoma 04/19/2013   left distal lat ant thigh   Basal cell carcinoma 04/11/2014   right forearm, right post shoulder near deltoid   Basal cell carcinoma 10/12/2018   left mid back 7.0 cm lat to spine   Basal cell carcinoma 06/16/2023   R forehead, ED&C   CAD (coronary artery disease)    Clotting disorder (HCC)    Dysplastic nevus 02/02/2007   left volar mid to prox forearm   Dysplastic nevus 07/09/2010   right medial breast 1.5 cm med to areola, right med breast parasternal, right post distal deltoid   Eczema    GERD (gastroesophageal reflux disease)    Hyperlipemia    Hypertension    Lymphoma (HCC) 2015   Melanoma in situ (HCC) 12/22/2006   right medial breast, med sup quad   Personal history of chemotherapy 2017   6 months of chemo   Past Surgical History:  Procedure Laterality Date   AUGMENTATION MAMMAPLASTY Bilateral 2003   CARDIAC CATHETERIZATION     X3 - has 9 stents. 4 LAD, 1 posterior, Most recent cath 03/2021   COLONOSCOPY  06/29/2012   repeat 10 yrs - Dr. Bluford Kaufmann   COLONOSCOPY WITH PROPOFOL N/A 08/07/2022   Procedure: COLONOSCOPY WITH PROPOFOL;  Surgeon: Midge Minium, MD;  Location: [facility];  Service: Endoscopy;  Laterality: N/A;  needs to be first case   CORONARY IMAGING/OCT N/A 07/07/2023   Procedure: CORONARY IMAGING/OCT;  Surgeon: Iran Ouch, MD;  Location: [facility];  Service: Cardiovascular;  Laterality: N/A;   CORONARY LITHOTRIPSY N/A 07/07/2023   Procedure: CORONARY LITHOTRIPSY;  Surgeon: Iran Ouch, MD;  Location: [facility];  Service: Cardiovascular;  Laterality: N/A;   CORONARY LITHOTRIPSY N/A 10/20/2023   Procedure: CORONARY LITHOTRIPSY;  Surgeon: Iran Ouch, MD;  Location: [facility];  Service: Cardiovascular;  Laterality: N/A;   CORONARY STENT INTERVENTION N/A 07/07/2023   Procedure: CORONARY STENT INTERVENTION;  Surgeon: Iran Ouch, MD;  Location: [facility];  Service: Cardiovascular;  Laterality: N/A;   CORONARY STENT INTERVENTION N/A 10/20/2023   Procedure: CORONARY STENT INTERVENTION;  Surgeon: Iran Ouch, MD;  Location: [facility];  Service: Cardiovascular;  Laterality: N/A;   CORONARY ULTRASOUND/IVUS N/A 10/20/2023   Procedure: Coronary Ultrasound/IVUS;  Surgeon: Iran Ouch, MD;  Location: [facility];  Service: Cardiovascular;  Laterality: N/A;   ESOPHAGOGASTRODUODENOSCOPY (EGD) WITH PROPOFOL N/A 11/22/2015   Procedure: ESOPHAGOGASTRODUODENOSCOPY (EGD) WITH PROPOFOL;  Surgeon: Midge Minium, MD;  Location: [facility];  Service: Endoscopy;  Laterality: N/A;   FACIAL COSMETIC SURGERY     gender re-affirmation     HERNIA REPAIR  x2   POLYPECTOMY  08/07/2022   Procedure: POLYPECTOMY;  Surgeon: Midge Minium, MD;  Location: [facility];  Service: Endoscopy;;   PORTA CATH REMOVAL N/A 04/10/2019   Procedure: PORTA CATH REMOVAL;  Surgeon: Annice Needy, MD;  Location: [facility];  Service: Cardiovascular;  Laterality: N/A;   RIGHT/LEFT HEART CATH AND CORONARY ANGIOGRAPHY Bilateral 06/18/2023   Procedure: RIGHT/LEFT HEART CATH AND CORONARY ANGIOGRAPHY;  Surgeon: Iran Ouch, MD;  Location: [facility];  Service: Cardiovascular;  Laterality: Bilateral;   Patient Active Problem List   Diagnosis  Date Noted   Anxiety with depression 11/03/2023   Rectal prolapse 11/03/2023   Chest pain 07/07/2023   Acquired thrombophilia (HCC) 01/12/2023   Polyp of descending colon 08/07/2022   S/P angioplasty with stent 03/17/2021   Hx of melanoma of skin 05/22/2020   Psoriasis 03/30/2018   Mixed hyperlipidemia 03/31/2016   Dyspnea on exertion 03/31/2016   Screen for colon cancer    Benign neoplasm of ascending colon    CAD (coronary artery disease) 03/03/2015   Diverticulosis 03/03/2015   Benign essential HTN 03/03/2015   Diffuse large B-cell lymphoma of lymph nodes of axilla (HCC) 02/10/2015    PCP: Judithann Graves  REFERRING PROVIDER: Claudine Mouton  REFERRING DIAG:  K62.3 (ICD-10-CM) - Rectal prolapse  R15.9 (ICD-10-CM) - Full incontinence of feces    Rationale for Evaluation and Treatment Rehabilitation  THERAPY DIAG:  Sacrococcygeal disorders, not elsewhere classified  Other abnormalities of gait and mobility  Postural kyphosis of thoracic region  Postural kyphosis of thoracic  ONSET DATE: 6 months ago  SUBJECTIVE:       SUBJECTIVE STATEMENT TODAY:  Pt is low in sodium and is taking Gatorade. Since last week, pt cut back 2 sodas and started drinking 2 - 16 fl oz water,                                                                                                                                                                                      SUBJECTIVE STATEMENT ON EVAL 12/22/23:  1) rectal prolapse:  pt notices it 6 months ago   Pt is manually assisting to place rectal prolapse every hour ,   2) Straining with bowel movements:   uses Metamucil and Miralax and scheduling bowel movement morning 6:30am ,  With laxative: Stool Type  Type 4 , before Type 5 . Initially need to strain with each bowel movement , pt has to stretch.  Pt is scheduling and asking herself to have a BM in the morning. It takes 15 min. No issues with prolapse blocking bowel movements.   Pt has not met  with  nutritionist. Pt is eating more cereal and raisin bran. Daily fluid intake: 8-12 fl oz water, diet coke 80 fl oz, 8 fl oz ice tea. Pt does not have nocturia.  Pt goes to urinate in the middle of the night before going back to bed  Urination frequency is more related to "bladder shy" , tries to evacuate often, once every 2 hours  Medical Hx:  2003 gender affirmation surgery,pt was dilating her neovagina but reports it is not deep as it used to be  Hx of CAD with 10 cardiac stents , 8 are in LAD, 2  Walk 10 min at lunch  Did not cardiac rehab   [place] Lymphoma with chemo Tx 6 years ago. No changes to skin tissue at pelvic floor.    Fitness routine consists of 10 min walk during lunch break  Sleep cycle interrupted: Goes to bed at 8pm , gets up at midnight to assist with spouse with hoyer lift  from [facility]. Which pt has had to do for the past 8 years   Charchot-Marie-tooth disease.  She is IND with bathroom. 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 One of her patient room, she turns R and the other room L.      PERTINENT HISTORY:  See above  PAIN:  Are you having pain? No   PRECAUTIONS: No   WEIGHT BEARING RESTRICTIONS: No   FALLS:  Has patient fallen in last 6 months? No  LIVING ENVIRONMENT:  Lives with: spouse Lives in: 2 story , ramp to enter front door  Stairs:  2 flights  , 16 steps total  Has following equipment at home: for spouse uses a Nurse, adult    OCCUPATION: physician   PLOF: IND   PATIENT GOALS:  "I do not want any accidents" . Miralax and fiber has improved that.  Pt os making the effort at 6am - 6:30am for bowel movement without straining.  "Keep rectal prolapse within me and not having to check on it every hour"    OBJECTIVE:                                                    [facility] PT Assessment - 12/29/23 0833        Observation/Other Assessments   Scoliosis R shoulder/ iliac crest lowered, standing, supine: L ASIS/ low rib medial malleoli higher,  ASIS to medial malleoli 96 cm L, 15 cm R,      Posture/Postural Control   Posture Comments earlobe to wall, 23cm to wall , 15 cm heel to wall      Strength   Overall Strength Comments R hip flex 3/5 with R trunk lean, L hip  4/5,  B knee flex/ext 4/5, B hip abd  3/5 with posterior      Palpation   Spinal mobility L posterior rotation of T/L junction, L convex curve, R shoulder higher after shoe lift placed in R shoe but pelvic was levelled,  post Tx with stretches: more levelled R shoulder , reciprocal gait      Ambulation/Gait   Gait Comments 1.42 m/s reciprocal gait with shoe lift in R  shoe but pelvis in posterior tilt , thoracic kyphosis  present             [facility] Adult PT Treatment/Exercise - 12/29/23 0981       Self-Care   Other Self-Care Comments  reviewed work Animator station m provided recommendations, discussed use of squatty potty to minimize straining, provided shoe lif tin toe box and heel of R shoe which levelled pelvic girdle, explained anatomy and physiology of IAP and role of posture      Neuro Re-ed    Neuro Re-ed Details  cued for logrolling and sit to stand to minimize worsening of prolapse, cued for L thoracic mobility                 HOME EXERCISE PROGRAM: See pt instruction section    ASSESSMENT:  CLINICAL IMPRESSION:  Following Tx today which pt tolerated without complaints,  pt demo'd proper body mechanics with sit to stand and logrolling to minimize straining pelvic floor.   Addressed alignment of spine/ pelvis today which  showed leg length difference which led to placement of shoe lift in R toe box and heel. Pt showed  levelled pelvic alignment and more reciprocal gait pattern but R shoulder higher height  and L thoracic posterior rotation were still present.  Added stretch on L side after which pt  demo'd more levelled shoulder height.  Pt reported no pain with shoe lift.   Reviewed work Therapist, nutritional m provided recommendations, discussed use of squatty potty to minimize straining, provided shoe lif tin toe box and heel of R shoe which levelled pelvic girdle, explained anatomy and physiology of IAP and role of posture .  Plan to address posterior tilt of pelvis at upcoming session.   These improvements will  help promote optimize IAP system for improved pelvic floor function, trunk stability, gait, balance, stabilization with mobility tasks.  Plan to address pelvic floor issues once pelvis and spine are realigned to yield better outcomes.  Plan to administer PSDI outcome measure at next session.   Pt benefits from skilled PT.   OBJECTIVE IMPAIRMENTS decreased activity tolerance, decreased coordination, decreased endurance, decreased mobility, difficulty walking, decreased ROM, decreased strength, decreased safety awareness, hypomobility, increased muscle spasms, impaired flexibility, improper body mechanics, postural dysfunction,   ACTIVITY LIMITATIONS  self-care,  sleep, home chores, work tasks    PARTICIPATION LIMITATIONS:  community    PERSONAL FACTORS   interrupted sleep, low water intake, high soda intake, posture are also affecting patient's functional outcome.    REHAB POTENTIAL: Good   CLINICAL DECISION MAKING:  High complexity   EVALUATION COMPLEXITY: High    PATIENT EDUCATION:    Education details: Showed pt anatomy images. Explained muscles attachments/ connection, physiology of deep core system/ spinal- thoracic-pelvis-lower kinetic chain as they relate to pt's presentation, Sx, and past Hx. Explained what and how these areas of deficits need to be restored to balance and function    See Therapeutic activity / neuromuscular re-education section  Answered pt's questions.   Person educated: Patient Education method: Explanation, Demonstration, Tactile cues, Verbal  cues, and Handouts Education comprehension: verbalized understanding, returned demonstration, verbal cues required, tactile cues required, and needs further education     PLAN: PT FREQUENCY: 1x/week   PT DURATION: 10 weeks   PLANNED INTERVENTIONS: Therapeutic exercises, Therapeutic activity, Neuromuscular re-education, Balance training, Gait training, Patient/Family education, Self Care, Joint mobilization, Spinal mobilization, Moist heat, Taping, and Manual therapy, dry needling.   PLAN FOR NEXT SESSION: See  clinical impression for plan     GOALS: Goals reviewed with patient? Yes  SHORT TERM GOALS: Target date: 01/19/2024    Pt will demo IND with HEP                    Baseline: Not IND            Goal status: INITIAL   LONG TERM GOALS: Target date: 03/01/2024    1.Pt will demo proper deep core coordination without chest breathing and optimal excursion of diaphragm/pelvic floor in order to promote spinal stability and pelvic floor function  Baseline: dyscoordination Goal status: INITIAL  2.  Pt will demo proper body mechanics in against gravity tasks and ADLs ,  work tasks, fitness  to minimize straining pelvic floor / back    Baseline: not IND, improper form that places strain on pelvic floor  Goal status: INITIAL  3. Pt will demo reciprocal gait pattern, longer stride length  in order to ambulate safely in community and having a improved walking routine  Baseline:  decreased R stance, minimal L posterior thoracic rotation Goal status: INITIAL    4.  Pt will report decreased manually assisting to place rectal prolapse to once every 2 hours or decreased frequency by 50% per day  Baseline:  Pt is manually assisting to place rectal prolapse every hour ,  Goal status: INITIAL   5. Pt will be compliant with increased water intake and decreased soda, ice tea to promote bladder and bladder health  Baseline:  Daily fluid intake: 8-12 fl oz water, diet coke 80 fl oz, 8 fl oz  ice tea.  Goal status: INITIAL  6. Pt will report no more straining with BMs  > 75% of the time  in order to minimize worsening prolapse  Baseline: Initially need to strain with each bowel movement , pt has to stretch.  Pt is scheduling and asking herself to have a BM in the morning. It takes 15 min. Goal status: INITIAL   7. Pt will demo improved score by > 2 pt change with [place] Sleep Disorder Index to improve sleep hygiene , improve immune functions as a cancer survivor  Baseline:  plan to assess  Goal Status : INITIAL   8. Pt will demo improved upright posture with decreased distance from earlobe to wall , demo less thoracic kyphosis and will be compliant with HEP for strenghtening deep core and posterior deep musculature for cervico-scapulo-thoracic-lumbar -glut LKC chain in order to promote cardiovascular function ( Hx of stents, CAD)  and promote improved sleep positioning  Baseline: earlobe to wall, 23cm to wall , 15 cm heel to wall  Goal Status : INITIAL    9. Pt will improve distance on > 1000 ft to improve her walking routine of 10 min with improved cardio function, build core mm with maintaining walking routine  Baseline:   plan to assess  , Perceived Rate of Exertion score  Goal Status : INITIAL       Mariane Masters, PT 12/29/2023, 8:59 AM

## 2023-12-29 NOTE — Patient Instructions (Signed)
 Handout for ergonomic  Handout for squatty potty   __   Proper body mechanics with getting out of a chair to decrease strain  on back &pelvic floor   Avoid holding your breath when Getting out of the chair:  Scoot to front part of chair chair Heels behind knees, feet are hip width apart, nose over toes  Inhale like you are smelling roses Exhale to stand    Avoid straining pelvic floor, abdominal muscles , spine  Use log rolling technique instead of getting out of bed with your neck or the sit-up     Log rolling into and out of bed   Log rolling into and out of bed If getting out of bed on R side, Bent knees, scoot hips/ shoulder to L  Raise R arm completely overhead, rolling onto armpit  Then lower bent knees to bed to get into complete side lying position  Then drop legs off bed, and push up onto R elbow/forearm, and use L hand to push onto the bed    Dig elbows and feet to lift hte buttocks and scoot without lifting head    __  Lengthen Back rib by L  shoulder ( winging)    Lie on R  side , pillow between knees and under head  Pull  L arm overhead over mattress, grab the edge of mattress,pull it upward, drawing elbow away from ears  Breathing 10 reps  Brushing arm with 3/4 turn onto pillow behind back  Lying on R  side ,Pillow/ Block between knees     dragging top forearm across ribs below breast rotating 3/4 turn,  rotating  _L_ only this week ,  relax onto the pillow behind the back  and then back to other palm , maintain top palm on body whole top and not lift shoulder  Do this side this week       Wait do both sides until we have levelled out your spine and shoulders ___    ___

## 2024-01-02 ENCOUNTER — Other Ambulatory Visit: Payer: Self-pay

## 2024-01-05 ENCOUNTER — Other Ambulatory Visit: Payer: Self-pay

## 2024-01-05 ENCOUNTER — Encounter: Payer: Self-pay | Admitting: Cardiology

## 2024-01-05 ENCOUNTER — Ambulatory Visit: Attending: Surgery | Admitting: Physical Therapy

## 2024-01-05 ENCOUNTER — Ambulatory Visit: Payer: Commercial Managed Care - PPO | Attending: Cardiology | Admitting: Cardiology

## 2024-01-05 VITALS — BP 115/66 | HR 54 | Ht 71.0 in | Wt 162.6 lb

## 2024-01-05 DIAGNOSIS — M4004 Postural kyphosis, thoracic region: Secondary | ICD-10-CM | POA: Diagnosis not present

## 2024-01-05 DIAGNOSIS — I10 Essential (primary) hypertension: Secondary | ICD-10-CM

## 2024-01-05 DIAGNOSIS — M533 Sacrococcygeal disorders, not elsewhere classified: Secondary | ICD-10-CM | POA: Diagnosis not present

## 2024-01-05 DIAGNOSIS — R2689 Other abnormalities of gait and mobility: Secondary | ICD-10-CM | POA: Insufficient documentation

## 2024-01-05 DIAGNOSIS — I25118 Atherosclerotic heart disease of native coronary artery with other forms of angina pectoris: Secondary | ICD-10-CM

## 2024-01-05 MED ORDER — COLCHICINE 0.6 MG PO TABS
0.6000 mg | ORAL_TABLET | Freq: Every day | ORAL | 1 refills | Status: DC
  Filled 2024-01-05: qty 90, 90d supply, fill #0

## 2024-01-05 NOTE — Patient Instructions (Signed)
    Motion is lotion  - good morning and evening     On your side - winging and brushing   Pillow between knees and behind back     On your back with towel roll under the neck   - neck 6 directions  - angel wings - ( dragging arms)   - scoot hips to one side and rock knees to opposite, arms by side ,palms up       On your other side - winging and brushing   Pillow between knees and behind back

## 2024-01-05 NOTE — Therapy (Signed)
 OUTPATIENT PHYSICAL THERAPY TREATMENT    Patient Name: Kendra Salas MRN: [id number] DOB:Jun 22, 1951, 73 y.o., adult Today's Date: 01/05/2024   PT End of Session - 01/05/24 1459     Visit Number 3    Number of Visits 10    Date for PT Re-Evaluation 03/01/24    PT Start Time 1415    PT Stop Time 1500    PT Time Calculation (min) 45 min    Activity Tolerance Patient tolerated treatment well;No increased pain    Behavior During Therapy [facility] for tasks assessed/performed             Past Medical History:  Diagnosis Date   Anxiety    Basal cell carcinoma 03/07/2008   left dorsum mid forearm   Basal cell carcinoma 04/19/2013   left distal lat ant thigh   Basal cell carcinoma 04/11/2014   right forearm, right post shoulder near deltoid   Basal cell carcinoma 10/12/2018   left mid back 7.0 cm lat to spine   Basal cell carcinoma 06/16/2023   R forehead, ED&C   CAD (coronary artery disease)    Clotting disorder (HCC)    Dysplastic nevus 02/02/2007   left volar mid to prox forearm   Dysplastic nevus 07/09/2010   right medial breast 1.5 cm med to areola, right med breast parasternal, right post distal deltoid   Eczema    GERD (gastroesophageal reflux disease)    Hyperlipemia    Hypertension    Lymphoma (HCC) 2015   Melanoma in situ (HCC) 12/22/2006   right medial breast, med sup quad   Personal history of chemotherapy 2017   6 months of chemo   Past Surgical History:  Procedure Laterality Date   AUGMENTATION MAMMAPLASTY Bilateral 2003   CARDIAC CATHETERIZATION     X3 - has 9 stents. 4 LAD, 1 posterior, Most recent cath 03/2021   COLONOSCOPY  06/29/2012   repeat 10 yrs - Dr. Bluford Kaufmann   COLONOSCOPY WITH PROPOFOL N/A 08/07/2022   Procedure: COLONOSCOPY WITH PROPOFOL;  Surgeon: Midge Minium, MD;  Location: [facility];  Service: Endoscopy;  Laterality: N/A;  needs to be first case   CORONARY IMAGING/OCT N/A 07/07/2023   Procedure: CORONARY IMAGING/OCT;  Surgeon: Iran Ouch, MD;  Location: [facility];  Service: Cardiovascular;  Laterality: N/A;   CORONARY LITHOTRIPSY N/A 07/07/2023   Procedure: CORONARY LITHOTRIPSY;  Surgeon: Iran Ouch, MD;  Location: [facility];  Service: Cardiovascular;  Laterality: N/A;   CORONARY LITHOTRIPSY N/A 10/20/2023   Procedure: CORONARY LITHOTRIPSY;  Surgeon: Iran Ouch, MD;  Location: [facility];  Service: Cardiovascular;  Laterality: N/A;   CORONARY STENT INTERVENTION N/A 07/07/2023   Procedure: CORONARY STENT INTERVENTION;  Surgeon: Iran Ouch, MD;  Location: [facility];  Service: Cardiovascular;  Laterality: N/A;   CORONARY STENT INTERVENTION N/A 10/20/2023   Procedure: CORONARY STENT INTERVENTION;  Surgeon: Iran Ouch, MD;  Location: [facility];  Service: Cardiovascular;  Laterality: N/A;   CORONARY ULTRASOUND/IVUS N/A 10/20/2023   Procedure: Coronary Ultrasound/IVUS;  Surgeon: Iran Ouch, MD;  Location: [facility];  Service: Cardiovascular;  Laterality: N/A;   ESOPHAGOGASTRODUODENOSCOPY (EGD) WITH PROPOFOL N/A 11/22/2015   Procedure: ESOPHAGOGASTRODUODENOSCOPY (EGD) WITH PROPOFOL;  Surgeon: Midge Minium, MD;  Location: [facility];  Service: Endoscopy;  Laterality: N/A;   FACIAL COSMETIC SURGERY     gender re-affirmation     HERNIA REPAIR  x2   POLYPECTOMY  08/07/2022   Procedure: POLYPECTOMY;  Surgeon: Midge Minium, MD;  Location: [facility];  Service: Endoscopy;;   PORTA CATH REMOVAL N/A 04/10/2019   Procedure: PORTA CATH REMOVAL;  Surgeon: Annice Needy, MD;  Location: [facility];  Service: Cardiovascular;  Laterality: N/A;   RIGHT/LEFT HEART CATH AND CORONARY ANGIOGRAPHY Bilateral 06/18/2023   Procedure: RIGHT/LEFT HEART CATH AND CORONARY ANGIOGRAPHY;  Surgeon: Iran Ouch, MD;  Location: [facility];  Service: Cardiovascular;  Laterality: Bilateral;   Patient Active Problem List   Diagnosis  Date Noted   Anxiety with depression 11/03/2023   Rectal prolapse 11/03/2023   Chest pain 07/07/2023   Acquired thrombophilia (HCC) 01/12/2023   Polyp of descending colon 08/07/2022   S/P angioplasty with stent 03/17/2021   Hx of melanoma of skin 05/22/2020   Psoriasis 03/30/2018   Mixed hyperlipidemia 03/31/2016   Dyspnea on exertion 03/31/2016   Screen for colon cancer    Benign neoplasm of ascending colon    CAD (coronary artery disease) 03/03/2015   Diverticulosis 03/03/2015   Benign essential HTN 03/03/2015   Diffuse large B-cell lymphoma of lymph nodes of axilla (HCC) 02/10/2015    PCP: Judithann Graves  REFERRING PROVIDER: Claudine Mouton  REFERRING DIAG:  K62.3 (ICD-10-CM) - Rectal prolapse  R15.9 (ICD-10-CM) - Full incontinence of feces    Rationale for Evaluation and Treatment Rehabilitation  THERAPY DIAG:  Sacrococcygeal disorders, not elsewhere classified  Other abnormalities of gait and mobility  Postural kyphosis of thoracic region  Postural kyphosis of thoracic  ONSET DATE: 6 months ago  SUBJECTIVE:       SUBJECTIVE STATEMENT TODAY:  Pt has been wearing shoe lift and had no problems all week                                                                                                                                                                              SUBJECTIVE STATEMENT ON EVAL 12/22/23:  1) rectal prolapse:  pt notices it 6 months ago   Pt is manually assisting to place rectal prolapse every hour ,   2) Straining with bowel movements:   uses Metamucil and Miralax and scheduling bowel movement morning 6:30am ,  With laxative: Stool Type  Type 4 , before Type 5 . Initially need to strain with each bowel movement , pt has to stretch.  Pt is scheduling and asking herself to have a BM in the morning. It takes 15 min. No issues with prolapse blocking bowel movements.   Pt has not met with nutritionist. Pt is eating more cereal and raisin bran. Daily fluid  intake: 8-12 fl oz water, diet coke 80 fl  oz, 8 fl oz ice tea. Pt does not have nocturia.  Pt goes to urinate in the middle of the night before going back to bed  Urination frequency is more related to "bladder shy" , tries to evacuate often, once every 2 hours  Medical Hx:  2003 gender affirmation surgery,pt was dilating her neovagina but reports it is not deep as it used to be  Hx of CAD with 10 cardiac stents , 8 are in LAD, 2  Walk 10 min at lunch  Did not cardiac rehab   [place] Lymphoma with chemo Tx 6 years ago. No changes to skin tissue at pelvic floor.    Fitness routine consists of 10 min walk during lunch break  Sleep cycle interrupted: Goes to bed at 8pm , gets up at midnight to assist with spouse with hoyer lift  from [facility]. Which pt has had to do for the past 8 years   Charchot-Marie-tooth disease.  She is IND with bathroom. 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 One of her patient room, she turns R and the other room L.      PERTINENT HISTORY:  See above  PAIN:  Are you having pain? No   PRECAUTIONS: No   WEIGHT BEARING RESTRICTIONS: No   FALLS:  Has patient fallen in last 6 months? No  LIVING ENVIRONMENT:  Lives with: spouse Lives in: 2 story , ramp to enter front door  Stairs:  2 flights  , 16 steps total  Has following equipment at home: for spouse uses a Nurse, adult    OCCUPATION: physician   PLOF: IND   PATIENT GOALS:  "I do not want any accidents" . Miralax and fiber has improved that.  Pt os making the effort at 6am - 6:30am for bowel movement without straining.  "Keep rectal prolapse within me and not having to check on it every hour"    OBJECTIVE:                                                    [facility] PT Assessment - 01/05/24 1513       Palpation   SI assessment  levelled pelvis and shoulders with  shoe lift in shoe    Palpation comment tightness long intercostals posterior T12, deviated T 3-4 L, tightness along medial scapula/ cervical mm B             [facility] Adult PT Treatment/Exercise - 01/05/24 1459       Self-Care   Other Self-Care Comments  showed anatomy images      Neuro Re-ed    Neuro Re-ed Details  cued for cervico/ scapulo, thoracic / lumbar ROM HEP for more stacked posture      Modalities   Modalities Moist Heat      Moist Heat Therapy   Number Minutes Moist Heat 5 Minutes    Moist Heat Location --   spine, supported knee in butterfly ( guided relaxation)              HOME EXERCISE PROGRAM: See pt instruction section    ASSESSMENT:  CLINICAL IMPRESSION:  Following Tx today which  pt tolerated without complaints,  pt demo'd improved upright posture and more lateral and anterior excursion of diaphragm , more mobility of C/T and scapular area B. Added full spinal ROM HEP. Plan to add cervicoscapular strengthening and then deep core training next session.   These improvements will  help promote optimize IAP system for improved pelvic floor function, trunk stability, gait, balance, stabilization with mobility tasks.  Plan to address pelvic floor issues once pelvis and spine are realigned to yield better outcomes.  Plan to administer PSDI outcome measure at next session.   Pt benefits from skilled PT.   OBJECTIVE IMPAIRMENTS decreased activity tolerance, decreased coordination, decreased endurance, decreased mobility, difficulty walking, decreased ROM, decreased strength, decreased safety awareness, hypomobility, increased muscle spasms, impaired flexibility, improper body mechanics, postural dysfunction,   ACTIVITY LIMITATIONS  self-care,  sleep, home chores, work tasks    PARTICIPATION LIMITATIONS:  community    PERSONAL FACTORS   interrupted sleep, low water intake, high soda intake, posture are also affecting patient's functional outcome.    REHAB  POTENTIAL: Good   CLINICAL DECISION MAKING:  High complexity   EVALUATION COMPLEXITY: High    PATIENT EDUCATION:    Education details: Showed pt anatomy images. Explained muscles attachments/ connection, physiology of deep core system/ spinal- thoracic-pelvis-lower kinetic chain as they relate to pt's presentation, Sx, and past Hx. Explained what and how these areas of deficits need to be restored to balance and function    See Therapeutic activity / neuromuscular re-education section  Answered pt's questions.   Person educated: Patient Education method: Explanation, Demonstration, Tactile cues, Verbal cues, and Handouts Education comprehension: verbalized understanding, returned demonstration, verbal cues required, tactile cues required, and needs further education     PLAN: PT FREQUENCY: 1x/week   PT DURATION: 10 weeks   PLANNED INTERVENTIONS: Therapeutic exercises, Therapeutic activity, Neuromuscular re-education, Balance training, Gait training, Patient/Family education, Self Care, Joint mobilization, Spinal mobilization, Moist heat, Taping, and Manual therapy, dry needling.   PLAN FOR NEXT SESSION: See clinical impression for plan     GOALS: Goals reviewed with patient? Yes  SHORT TERM GOALS: Target date: 01/19/2024    Pt will demo IND with HEP                    Baseline: Not IND            Goal status: INITIAL   LONG TERM GOALS: Target date: 03/01/2024    1.Pt will demo proper deep core coordination without chest breathing and optimal excursion of diaphragm/pelvic floor in order to promote spinal stability and pelvic floor function  Baseline: dyscoordination Goal status: INITIAL  2.  Pt will demo proper body mechanics in against gravity tasks and ADLs ,  work tasks, fitness  to minimize straining pelvic floor / back    Baseline: not IND, improper form that places strain on pelvic floor  Goal status: INITIAL  3. Pt will demo reciprocal gait pattern, longer  stride length  in order to ambulate safely in community and having a improved walking routine  Baseline:  decreased R stance, minimal L posterior thoracic rotation Goal status: INITIAL    4.  Pt will report decreased manually assisting to place rectal prolapse to once every 2 hours or decreased frequency by 50% per day  Baseline:  Pt is manually assisting to place rectal prolapse every hour ,  Goal status: INITIAL   5. Pt will be compliant with increased water intake and decreased soda, ice  tea to promote bladder and bladder health  Baseline:  Daily fluid intake: 8-12 fl oz water, diet coke 80 fl oz, 8 fl oz ice tea.  Goal status: INITIAL  6. Pt will report no more straining with BMs  > 75% of the time  in order to minimize worsening prolapse  Baseline: Initially need to strain with each bowel movement , pt has to stretch.  Pt is scheduling and asking herself to have a BM in the morning. It takes 15 min. Goal status: INITIAL   7. Pt will demo improved score by > 2 pt change with [place] Sleep Disorder Index to improve sleep hygiene , improve immune functions as a cancer survivor  Baseline:  plan to assess  Goal Status : INITIAL   8. Pt will demo improved upright posture with decreased distance from earlobe to wall , demo less thoracic kyphosis and will be compliant with HEP for strenghtening deep core and posterior deep musculature for cervico-scapulo-thoracic-lumbar -glut LKC chain in order to promote cardiovascular function ( Hx of stents, CAD)  and promote improved sleep positioning  Baseline: earlobe to wall, 23cm to wall , 15 cm heel to wall  Goal Status : INITIAL    9. Pt will improve distance on > 1000 ft to improve her walking routine of 10 min with improved cardio function, build core mm with maintaining walking routine  Baseline:   plan to assess  , Perceived Rate of Exertion score  Goal Status : INITIAL       Mariane Masters, PT 01/05/2024, 4:02 PM

## 2024-01-05 NOTE — Progress Notes (Signed)
 Cardiology Office Note:    Date:  01/05/2024   ID:  Kendra Salas, DOB 08-27-1951, MRN [id number]  PCP:  Kendra Goodell, MD   [facility] Providers Cardiologist:  Kendra Odea, MD     Referring MD: Kendra Milan, MD   Chief Complaint  Patient presents with   Follow-up    No chest pain,pressure or SOB, medication reviewed verbally with patient    History of Present Illness:    Kendra Salas is a 73 y.o. adult with a hx of CAD/PCI x 10 (7 stents to LAD and two to RCA, 1 to diagonal), 80% mid RCA stenosis s/p drug-eluting stent 10/24, angioplasty of mid to distal LAD 1/25 hypertension, hyperlipidemia, who presents for follow-up.  Patient denies chest pain or shortness of breath.  Felt a lot better after angioplasty of LAD 10/2023 with drug-coated balloon.  Compliant with medications as prescribed.  Feels well, has no concerns at this time.   Prior notes/studies Left heart cath 06/2023 90% in-stent restenosis mid to distal LAD, overlapping stents in mid LAD 50% in-stent restenosis, proximal RCA stent 30% in-stent stenosis, diagonal stent 10% stenosis, 70% mid RCA stenosis. Left heart cath 03/2021 s/p PCI of D2, prior LAD stents mild ISR. Lexiscan Myoview 10/2021 fixed apical defect consistent with prior infarct.  Past Medical History:  Diagnosis Date   Anxiety    Basal cell carcinoma 03/07/2008   left dorsum mid forearm   Basal cell carcinoma 04/19/2013   left distal lat ant thigh   Basal cell carcinoma 04/11/2014   right forearm, right post shoulder near deltoid   Basal cell carcinoma 10/12/2018   left mid back 7.0 cm lat to spine   Basal cell carcinoma 06/16/2023   R forehead, ED&C   CAD (coronary artery disease)    Clotting disorder (HCC)    Dysplastic nevus 02/02/2007   left volar mid to prox forearm   Dysplastic nevus 07/09/2010   right medial breast 1.5 cm med to areola, right med breast parasternal, right post distal deltoid   Eczema    GERD  (gastroesophageal reflux disease)    Hyperlipemia    Hypertension    Lymphoma (HCC) 2015   Melanoma in situ (HCC) 12/22/2006   right medial breast, med sup quad   Personal history of chemotherapy 2017   6 months of chemo    Past Surgical History:  Procedure Laterality Date   AUGMENTATION MAMMAPLASTY Bilateral 2003   CARDIAC CATHETERIZATION     X3 - has 9 stents. 4 LAD, 1 posterior, Most recent cath 03/2021   COLONOSCOPY  06/29/2012   repeat 10 yrs - Dr. Bluford Kaufmann   COLONOSCOPY WITH PROPOFOL N/A 08/07/2022   Procedure: COLONOSCOPY WITH PROPOFOL;  Surgeon: Kendra Minium, MD;  Location: [facility];  Service: Endoscopy;  Laterality: N/A;  needs to be first case   CORONARY IMAGING/OCT N/A 07/07/2023   Procedure: CORONARY IMAGING/OCT;  Surgeon: Kendra Ouch, MD;  Location: [facility];  Service: Cardiovascular;  Laterality: N/A;   CORONARY LITHOTRIPSY N/A 07/07/2023   Procedure: CORONARY LITHOTRIPSY;  Surgeon: Kendra Ouch, MD;  Location: [facility];  Service: Cardiovascular;  Laterality: N/A;   CORONARY LITHOTRIPSY N/A 10/20/2023   Procedure: CORONARY LITHOTRIPSY;  Surgeon: Kendra Ouch, MD;  Location: [facility];  Service: Cardiovascular;  Laterality: N/A;   CORONARY STENT INTERVENTION N/A 07/07/2023   Procedure: CORONARY STENT INTERVENTION;  Surgeon: Kendra Ouch, MD;  Location: [facility]  INVASIVE CV LAB;  Service: Cardiovascular;  Laterality: N/A;   CORONARY STENT INTERVENTION N/A 10/20/2023   Procedure: CORONARY STENT INTERVENTION;  Surgeon: Kendra Ouch, MD;  Location: [facility];  Service: Cardiovascular;  Laterality: N/A;   CORONARY ULTRASOUND/IVUS N/A 10/20/2023   Procedure: Coronary Ultrasound/IVUS;  Surgeon: Kendra Ouch, MD;  Location: [facility];  Service: Cardiovascular;  Laterality: N/A;   ESOPHAGOGASTRODUODENOSCOPY (EGD) WITH PROPOFOL N/A 11/22/2015   Procedure: ESOPHAGOGASTRODUODENOSCOPY (EGD) WITH PROPOFOL;  Surgeon:  Kendra Minium, MD;  Location: [facility];  Service: Endoscopy;  Laterality: N/A;   FACIAL COSMETIC SURGERY     gender re-affirmation     HERNIA REPAIR     x2   POLYPECTOMY  08/07/2022   Procedure: POLYPECTOMY;  Surgeon: Kendra Minium, MD;  Location: [facility];  Service: Endoscopy;;   PORTA CATH REMOVAL N/A 04/10/2019   Procedure: PORTA CATH REMOVAL;  Surgeon: Kendra Needy, MD;  Location: [facility] INVASIVE CV LAB;  Service: Cardiovascular;  Laterality: N/A;   RIGHT/LEFT HEART CATH AND CORONARY ANGIOGRAPHY Bilateral 06/18/2023   Procedure: RIGHT/LEFT HEART CATH AND CORONARY ANGIOGRAPHY;  Surgeon: Kendra Ouch, MD;  Location: [facility] INVASIVE CV LAB;  Service: Cardiovascular;  Laterality: Bilateral;    Current Medications: Current Meds  Medication Sig   acetaminophen (TYLENOL) 500 MG tablet Take 500 mg by mouth every 6 (six) hours as needed (pain.).   amLODipine (NORVASC) 5 MG tablet Take 1 tablet (5 mg total) by mouth daily.   aspirin EC 81 MG tablet Take 81 mg by mouth in the morning.   bimatoprost (LATISSE) 0.03 % ophthalmic solution Place 1 Application into both eyes at bedtime. Place one drop on applicator and apply evenly along the skin of the upper eyelid at base of eyelashes once daily at bedtime; repeat procedure for second eye (use a clean applicator).   clopidogrel (PLAVIX) 75 MG tablet Take 1 tablet (75 mg total) by mouth daily. OFFICE VISIT NEEDED FOR ADDITIONAL REFILLS   colchicine 0.6 MG tablet Take 1 tablet (0.6 mg total) by mouth daily.   dupilumab (DUPIXENT) 300 MG/2ML prefilled syringe Inject 300 mg into the skin every 14 (fourteen) days.   estradiol (ESTRACE) 2 MG tablet Take 1 tablet (2 mg total) by mouth daily.   finasteride (PROSCAR) 5 MG tablet Take 1 tablet (5 mg total) by mouth daily.   hyoscyamine (ANASPAZ) 0.125 MG TBDP disintergrating tablet Place 1 tablet (0.125 mg total) under the tongue every 6 (six) hours as needed.   isosorbide mononitrate (IMDUR)  30 MG 24 hr tablet Take 1 tablet (30 mg total) by mouth daily.   metoprolol succinate (TOPROL XL) 25 MG 24 hr tablet Take 1 tablet (25 mg total) by mouth daily.   mupirocin ointment (BACTROBAN) 2 % Apply 1 Application topically 2 (two) times daily. (Patient taking differently: Apply 1 Application topically 2 (two) times daily as needed (nasal flares).)   nitroGLYCERIN (NITROSTAT) 0.4 MG SL tablet Place 1 tablet (0.4 mg total) under the tongue every 5 (five) minutes as needed for chest pain.   pantoprazole (PROTONIX) 40 MG tablet Take 1 tablet (40 mg total) by mouth daily.   rosuvastatin (CRESTOR) 40 MG tablet Take 1 tablet (40 mg total) by mouth daily.   Ruxolitinib Phosphate (OPZELURA) 1.5 % CREA Apply 1 Application topically as directed. Qd to aa eczema   sertraline (ZOLOFT) 50 MG tablet Take 1 tablet (50 mg total) by mouth daily. (Patient taking differently: Take 75 mg  by mouth daily. Total 75mg )   triamcinolone cream (KENALOG) 0.1 % Apply 1 application topically 2 (two) times daily. To rash on legs (Patient taking differently: Apply 1 application  topically daily as needed (Eczema).)   valsartan (DIOVAN) 160 MG tablet Take 1 tablet (160 mg total) by mouth daily.     Allergies:   Patient has no known allergies.   Social History   Socioeconomic History   Marital status: Married    Spouse name: Not on file   Number of children: Not on file   Years of education: Not on file   Highest education level: Professional school degree (e.g., MD, DDS, DVM, JD)  Occupational History   Not on file  Tobacco Use   Smoking status: Never   Smokeless tobacco: Never  Vaping Use   Vaping status: Never Used  Substance and Sexual Activity   Alcohol use: Yes    Alcohol/week: 2.0 standard drinks of alcohol    Types: 2 Shots of liquor per week    Comment: 1 Margarita - Wed and Sat   Drug use: Never   Sexual activity: Not on file  Other Topics Concern   Not on file  Social History Narrative   Not on  file   Social Drivers of Health   Financial Resource Strain: Low Risk  (12/08/2023)   Received from [facility]   Overall Financial Resource Strain (CARDIA)    Difficulty of Paying Living Expenses: Not hard at all  Food Insecurity: No Food Insecurity (12/08/2023)   Received from [facility]   Hunger Vital Sign    Worried About Running Out of Food in the Last Year: Never true    Ran Out of Food in the Last Year: Never true  Transportation Needs: No Transportation Needs (12/08/2023)   [facility] - Transportation    In the past 12 months, has lack of transportation kept you from medical appointments or from getting medications?: No    Lack of Transportation (Non-Medical): No  Physical Activity: Unknown (11/03/2023)   Exercise Vital Sign    Days of Exercise per Week: 0 days    Minutes of Exercise per Session: Not on file  Stress: Stress Concern Present (11/03/2023)   [employer] of Occupational Health - Occupational Stress Questionnaire    Feeling of Stress : Rather much  Social Connections: Moderately Isolated (11/03/2023)   Social Connection and Isolation Panel [NHANES]    Frequency of Communication with Friends and Family: More than three times a week    Frequency of Social Gatherings with Friends and Family: More than three times a week    Attends Religious Services: Never    Database administrator or Organizations: No    Attends Engineer, structural: Not on file    Marital Status: Married     Family History: The patient's family history includes Breast cancer (age of onset: 67) in her maternal aunt; Heart attack in her mother.  ROS:   Please see the history of present illness.     All other systems reviewed and are negative.  EKGs/Labs/Other Studies Reviewed:    The following studies were reviewed today:       Recent Labs: 09/27/2023: Hemoglobin 13.4; Platelets 283 10/20/2023: BUN  15; Creatinine, Ser 0.96; Potassium 4.4; Sodium 137  Recent Lipid Panel    Component Value Date/Time   CHOL 116 12/22/2022 0000   CHOL 125 05/04/2019 0803  TRIG 92 12/22/2022 0000   HDL 47 12/22/2022 0000   HDL 39 (L) 05/04/2019 0803   CHOLHDL 3.2 05/04/2019 0803   LDLCALC 51 12/22/2022 0000   LDLCALC 66 05/04/2019 0803     Risk Assessment/Calculations:             Physical Exam:    VS:  BP 115/66 (BP Location: Left Arm, Patient Position: Sitting, Cuff Size: Normal)   Pulse (!) 54   Ht 5\' 11"  (1.803 m)   Wt 162 lb 9.6 oz (73.8 kg)   SpO2 97%   BMI 22.68 kg/m     Wt Readings from Last 3 Encounters:  01/05/24 162 lb 9.6 oz (73.8 kg)  11/03/23 157 lb 12.8 oz (71.6 kg)  10/20/23 155 lb (70.3 kg)     GEN:  Well nourished, well developed in no acute distress HEENT: Normal NECK: No JVD; No carotid bruits CARDIAC: RRR, no murmurs, rubs, gallops RESPIRATORY:  Clear to auscultation without rales, wheezing or rhonchi  ABDOMEN: Soft, non-tender, non-distended MUSCULOSKELETAL:  No edema; No deformity  SKIN: Warm and dry NEUROLOGIC:  Alert and oriented x 3 PSYCHIATRIC:  Normal affect   ASSESSMENT:    1. Coronary artery disease of native artery of native heart with stable angina pectoris (HCC)   2. Primary hypertension    PLAN:    In order of problems listed above:  CAD s/p PCI x 10 total.  80% mid RCA s/p PCI to mid RCA 10/24, PTCA 10/2023.  Denies chest pain.  Start low-dose colchicine 0.6 mg daily for anti-inflammatory benefit and reducing MACE ([employer] trial). Continue Toprol-XL 25 mg daily,  Imdur 30 mg daily for antianginal benefit.  Continue aspirin, Plavix, Crestor 40 mg daily.  LDL at goal..  EF 50-55%.  Consider bypass with LIMA to LAD if symptoms of angina presents in worsening LAD disease occurs despite optimal antianginal therapy. Hypertension, BP controlled, continue valsartan 160 mg daily, Toprol-XL 25 mg daily   Follow-up in 6 months         Medication  Adjustments/Labs and Tests Ordered: Current medicines are reviewed at length with the patient today.  Concerns regarding medicines are outlined above.  No orders of the defined types were placed in this encounter.  Meds ordered this encounter  Medications   colchicine 0.6 MG tablet    Sig: Take 1 tablet (0.6 mg total) by mouth daily.    Dispense:  90 tablet    Refill:  1    okay to change to tablets per secure epic chat with Tillman Sers RN 01/05/24/ 8:56 am MEF    Patient Instructions  Medication Instructions:   Start Colchicine 0.6 MG daily.  *If you need a refill on your cardiac medications before your next appointment, please call your pharmacy*  Lab Work: None ordered.  If you have labs (blood work) drawn today and your tests are completely normal, you will receive your results only by: MyChart Message (if you have MyChart) OR A paper copy in the mail If you have any lab test that is abnormal or we need to change your treatment, we will call you to review the results.  Testing/Procedures: None ordered.   Follow-Up: At [facility], you and your health needs are our priority.  As part of our continuing mission to provide you with exceptional heart care, our providers are all part of one team.  This team includes your primary Cardiologist (physician) and Advanced Practice Providers or APPs (Physician Assistants and  Nurse Practitioners) who all work together to provide you with the care you need, when you need it.  Your next appointment:   6 month(s)  Provider:   You may see Kendra Odea, MD or one of the following Advanced Practice Providers on your designated Care Team:   Nicolasa Ducking, NP Ames Dura, PA-C Eula Listen, PA-C Cadence [place], PA-C Charlsie Quest, NP Carlos Levering, NP    We recommend signing up for the patient portal called "MyChart".  Sign up information is provided on this After Visit Summary.  MyChart is used to connect with  patients for Virtual Visits (Telemedicine).  Patients are able to view lab/test results, encounter notes, upcoming appointments, etc.  Non-urgent messages can be sent to your provider as well.   To learn more about what you can do with MyChart, go to [email or web address].            Signed, Kendra Odea, MD  01/05/2024 9:14 AM    [facility]

## 2024-01-05 NOTE — Patient Instructions (Signed)
 Medication Instructions:   Start Colchicine 0.6 MG daily.  *If you need a refill on your cardiac medications before your next appointment, please call your pharmacy*  Lab Work: None ordered.  If you have labs (blood work) drawn today and your tests are completely normal, you will receive your results only by: MyChart Message (if you have MyChart) OR A paper copy in the mail If you have any lab test that is abnormal or we need to change your treatment, we will call you to review the results.  Testing/Procedures: None ordered.   Follow-Up: At [facility], you and your health needs are our priority.  As part of our continuing mission to provide you with exceptional heart care, our providers are all part of one team.  This team includes your primary Cardiologist (physician) and Advanced Practice Providers or APPs (Physician Assistants and Nurse Practitioners) who all work together to provide you with the care you need, when you need it.  Your next appointment:   6 month(s)  Provider:   You may see Debbe Odea, MD or one of the following Advanced Practice Providers on your designated Care Team:   Nicolasa Ducking, NP Ames Dura, PA-C Eula Listen, PA-C Cadence [place], PA-C Charlsie Quest, NP Carlos Levering, NP    We recommend signing up for the patient portal called "MyChart".  Sign up information is provided on this After Visit Summary.  MyChart is used to connect with patients for Virtual Visits (Telemedicine).  Patients are able to view lab/test results, encounter notes, upcoming appointments, etc.  Non-urgent messages can be sent to your provider as well.   To learn more about what you can do with MyChart, go to [email or web address].

## 2024-01-12 ENCOUNTER — Ambulatory Visit: Admitting: Physical Therapy

## 2024-01-12 DIAGNOSIS — M4004 Postural kyphosis, thoracic region: Secondary | ICD-10-CM

## 2024-01-12 DIAGNOSIS — R2689 Other abnormalities of gait and mobility: Secondary | ICD-10-CM | POA: Diagnosis not present

## 2024-01-12 DIAGNOSIS — M533 Sacrococcygeal disorders, not elsewhere classified: Secondary | ICD-10-CM

## 2024-01-12 NOTE — Therapy (Signed)
 OUTPATIENT PHYSICAL THERAPY TREATMENT    Patient Name: Kendra Salas MRN: [id number] DOB:July 07, 1951, 73 y.o., adult Today's Date: 01/12/2024   PT End of Session - 01/12/24 0817     Visit Number 4    Number of Visits 10    Date for PT Re-Evaluation 03/01/24    PT Start Time 0802    PT Stop Time 0845    PT Time Calculation (min) 43 min    Activity Tolerance Patient tolerated treatment well;No increased pain    Behavior During Therapy [facility] for tasks assessed/performed             Past Medical History:  Diagnosis Date   Anxiety    Basal cell carcinoma 03/07/2008   left dorsum mid forearm   Basal cell carcinoma 04/19/2013   left distal lat ant thigh   Basal cell carcinoma 04/11/2014   right forearm, right post shoulder near deltoid   Basal cell carcinoma 10/12/2018   left mid back 7.0 cm lat to spine   Basal cell carcinoma 06/16/2023   R forehead, ED&C   CAD (coronary artery disease)    Clotting disorder (HCC)    Dysplastic nevus 02/02/2007   left volar mid to prox forearm   Dysplastic nevus 07/09/2010   right medial breast 1.5 cm med to areola, right med breast parasternal, right post distal deltoid   Eczema    GERD (gastroesophageal reflux disease)    Hyperlipemia    Hypertension    Lymphoma (HCC) 2015   Melanoma in situ (HCC) 12/22/2006   right medial breast, med sup quad   Personal history of chemotherapy 2017   6 months of chemo   Past Surgical History:  Procedure Laterality Date   AUGMENTATION MAMMAPLASTY Bilateral 2003   CARDIAC CATHETERIZATION     X3 - has 9 stents. 4 LAD, 1 posterior, Most recent cath 03/2021   COLONOSCOPY  06/29/2012   repeat 10 yrs - Dr. Bluford Kaufmann   COLONOSCOPY WITH PROPOFOL N/A 08/07/2022   Procedure: COLONOSCOPY WITH PROPOFOL;  Surgeon: Midge Minium, MD;  Location: [facility];  Service: Endoscopy;  Laterality: N/A;  needs to be first case   CORONARY IMAGING/OCT N/A 07/07/2023   Procedure: CORONARY IMAGING/OCT;  Surgeon: Iran Ouch, MD;  Location: [facility];  Service: Cardiovascular;  Laterality: N/A;   CORONARY LITHOTRIPSY N/A 07/07/2023   Procedure: CORONARY LITHOTRIPSY;  Surgeon: Iran Ouch, MD;  Location: [facility];  Service: Cardiovascular;  Laterality: N/A;   CORONARY LITHOTRIPSY N/A 10/20/2023   Procedure: CORONARY LITHOTRIPSY;  Surgeon: Iran Ouch, MD;  Location: [facility];  Service: Cardiovascular;  Laterality: N/A;   CORONARY STENT INTERVENTION N/A 07/07/2023   Procedure: CORONARY STENT INTERVENTION;  Surgeon: Iran Ouch, MD;  Location: [facility];  Service: Cardiovascular;  Laterality: N/A;   CORONARY STENT INTERVENTION N/A 10/20/2023   Procedure: CORONARY STENT INTERVENTION;  Surgeon: Iran Ouch, MD;  Location: [facility];  Service: Cardiovascular;  Laterality: N/A;   CORONARY ULTRASOUND/IVUS N/A 10/20/2023   Procedure: Coronary Ultrasound/IVUS;  Surgeon: Iran Ouch, MD;  Location: [facility];  Service: Cardiovascular;  Laterality: N/A;   ESOPHAGOGASTRODUODENOSCOPY (EGD) WITH PROPOFOL N/A 11/22/2015   Procedure: ESOPHAGOGASTRODUODENOSCOPY (EGD) WITH PROPOFOL;  Surgeon: Midge Minium, MD;  Location: [facility];  Service: Endoscopy;  Laterality: N/A;   FACIAL COSMETIC SURGERY     gender re-affirmation     HERNIA REPAIR  x2   POLYPECTOMY  08/07/2022   Procedure: POLYPECTOMY;  Surgeon: Midge Minium, MD;  Location: [facility];  Service: Endoscopy;;   PORTA CATH REMOVAL N/A 04/10/2019   Procedure: PORTA CATH REMOVAL;  Surgeon: Annice Needy, MD;  Location: [facility];  Service: Cardiovascular;  Laterality: N/A;   RIGHT/LEFT HEART CATH AND CORONARY ANGIOGRAPHY Bilateral 06/18/2023   Procedure: RIGHT/LEFT HEART CATH AND CORONARY ANGIOGRAPHY;  Surgeon: Iran Ouch, MD;  Location: [facility];  Service: Cardiovascular;  Laterality: Bilateral;   Patient Active Problem List   Diagnosis  Date Noted   Anxiety with depression 11/03/2023   Rectal prolapse 11/03/2023   Chest pain 07/07/2023   Acquired thrombophilia (HCC) 01/12/2023   Polyp of descending colon 08/07/2022   S/P angioplasty with stent 03/17/2021   Hx of melanoma of skin 05/22/2020   Psoriasis 03/30/2018   Mixed hyperlipidemia 03/31/2016   Dyspnea on exertion 03/31/2016   Screen for colon cancer    Benign neoplasm of ascending colon    CAD (coronary artery disease) 03/03/2015   Diverticulosis 03/03/2015   Benign essential HTN 03/03/2015   Diffuse large B-cell lymphoma of lymph nodes of axilla (HCC) 02/10/2015    PCP: Judithann Graves  REFERRING PROVIDER: Claudine Mouton  REFERRING DIAG:  K62.3 (ICD-10-CM) - Rectal prolapse  R15.9 (ICD-10-CM) - Full incontinence of feces    Rationale for Evaluation and Treatment Rehabilitation  THERAPY DIAG:  Sacrococcygeal disorders, not elsewhere classified  Postural kyphosis of thoracic region  Other abnormalities of gait and mobility  Postural kyphosis of thoracic  ONSET DATE: 6 months ago  SUBJECTIVE:       SUBJECTIVE STATEMENT TODAY:  Pt has been wearing shoe lift and had no problems all week                                                                                                                                                                              SUBJECTIVE STATEMENT ON EVAL 12/22/23:  1) rectal prolapse:  pt notices it 6 months ago   Pt is manually assisting to place rectal prolapse every hour ,   2) Straining with bowel movements:   uses Metamucil and Miralax and scheduling bowel movement morning 6:30am ,  With laxative: Stool Type  Type 4 , before Type 5 . Initially need to strain with each bowel movement , pt has to stretch.  Pt is scheduling and asking herself to have a BM in the morning. It takes 15 min. No issues with prolapse blocking bowel movements.   Pt has not met with nutritionist. Pt is eating more cereal and raisin bran. Daily fluid  intake: 8-12 fl oz water, diet coke 80 fl  oz, 8 fl oz ice tea. Pt does not have nocturia.  Pt goes to urinate in the middle of the night before going back to bed  Urination frequency is more related to "bladder shy" , tries to evacuate often, once every 2 hours  Medical Hx:  2003 gender affirmation surgery,pt was dilating her neovagina but reports it is not deep as it used to be  Hx of CAD with 10 cardiac stents , 8 are in LAD, 2  Walk 10 min at lunch  Did not cardiac rehab   [place] Lymphoma with chemo Tx 6 years ago. No changes to skin tissue at pelvic floor.    Fitness routine consists of 10 min walk during lunch break  Sleep cycle interrupted: Goes to bed at 8pm , gets up at midnight to assist with spouse with hoyer lift  from [facility]. Which pt has had to do for the past 8 years   Charchot-Marie-tooth disease.  She is IND with bathroom. 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 One of her patient room, she turns R and the other room L.      PERTINENT HISTORY:  See above  PAIN:  Are you having pain? No   PRECAUTIONS: No   WEIGHT BEARING RESTRICTIONS: No   FALLS:  Has patient fallen in last 6 months? No  LIVING ENVIRONMENT:  Lives with: spouse Lives in: 2 story , ramp to enter front door  Stairs:  2 flights  , 16 steps total  Has following equipment at home: for spouse uses a Nurse, adult    OCCUPATION: physician   PLOF: IND   PATIENT GOALS:  "I do not want any accidents" . Miralax and fiber has improved that.  Pt os making the effort at 6am - 6:30am for bowel movement without straining.  "Keep rectal prolapse within me and not having to check on it every hour"    OBJECTIVE:                                                     [facility] PT Assessment - 01/12/24 0865       Observation/Other Assessments   Scoliosis levelled shoulder  and pelvis with shoe lift in R shoe      Coordination   Coordination and Movement Description poor coordination with segmental mobility of spine, retraction of scapula. cervical      Palpation   Spinal mobility thoracic kyphosis less compared to last visit but still requiring more manual to promote more anterior rib excursion    Palpation comment lower T spine hypomobile             [facility] Adult PT Treatment/Exercise - 01/12/24 0826       Therapeutic Activites    Other Therapeutic Activities provided seated version for thoracic rotaion      Neuro Re-ed    Neuro Re-ed Details  cued for segmental coordination w/ cat /cow,  scapular retraction in childs pose rocking/ upper trunk twist in table top  , cued for thoracic ext/ cervico/scapular retraction in prone positions ( cricket/ locust) but withheld these for next session until pt decreases thoracic kyphosis  further      Manual Therapy   Manual therapy comments PA mob Grade III T5-12 to promote thoracic extensions,                  HOME EXERCISE PROGRAM: See pt instruction section    ASSESSMENT:  CLINICAL IMPRESSION:  Following Tx today which pt tolerated without complaints,  pt demo'd improved more thoracic rotation and more segmental mobility today with new HEP in quadriped, seated positions, standing positions.  Cued for segmental coordination w/ cat /cow,  scapular retraction in childs pose rocking/ upper trunk twist in table top  , cued for thoracic ext/ cervico/scapular retraction in prone positions ( cricket/ locust) but withheld these for next session until pt decreases thoracic kyphosis further.    Plan to add cervicoscapular strengthening and then deep core training next session.   These improvements will  help promote optimize IAP system for improved pelvic floor function, trunk stability, gait, balance, stabilization with mobility tasks.  Plan to address pelvic floor issues once pelvis and spine are realigned  to yield better outcomes.  Plan to administer PSDI outcome measure at next session.   Pt benefits from skilled PT.   OBJECTIVE IMPAIRMENTS decreased activity tolerance, decreased coordination, decreased endurance, decreased mobility, difficulty walking, decreased ROM, decreased strength, decreased safety awareness, hypomobility, increased muscle spasms, impaired flexibility, improper body mechanics, postural dysfunction,   ACTIVITY LIMITATIONS  self-care,  sleep, home chores, work tasks    PARTICIPATION LIMITATIONS:  community    PERSONAL FACTORS   interrupted sleep, low water intake, high soda intake, posture are also affecting patient's functional outcome.    REHAB POTENTIAL: Good   CLINICAL DECISION MAKING:  High complexity   EVALUATION COMPLEXITY: High    PATIENT EDUCATION:    Education details: Showed pt anatomy images. Explained muscles attachments/ connection, physiology of deep core system/ spinal- thoracic-pelvis-lower kinetic chain as they relate to pt's presentation, Sx, and past Hx. Explained what and how these areas of deficits need to be restored to balance and function    See Therapeutic activity / neuromuscular re-education section  Answered pt's questions.   Person educated: Patient Education method: Explanation, Demonstration, Tactile cues, Verbal cues, and Handouts Education comprehension: verbalized understanding, returned demonstration, verbal cues required, tactile cues required, and needs further education     PLAN: PT FREQUENCY: 1x/week   PT DURATION: 10 weeks   PLANNED INTERVENTIONS: Therapeutic exercises, Therapeutic activity, Neuromuscular re-education, Balance training, Gait training, Patient/Family education, Self Care, Joint mobilization, Spinal mobilization, Moist heat, Taping, and Manual therapy, dry needling.   PLAN FOR NEXT SESSION: See clinical impression for plan     GOALS: Goals reviewed with patient? Yes  SHORT TERM GOALS: Target  date: 01/19/2024    Pt will demo IND with HEP                    Baseline: Not IND            Goal status: INITIAL   LONG TERM GOALS: Target date: 03/01/2024    1.Pt will demo proper deep core coordination without chest breathing and optimal excursion of diaphragm/pelvic floor in order to promote spinal stability and pelvic floor function  Baseline: dyscoordination Goal status: INITIAL  2.  Pt will demo proper body mechanics in against gravity tasks and ADLs ,  work tasks, fitness  to minimize straining pelvic floor / back    Baseline: not IND, improper form that places strain on pelvic floor  Goal  status: INITIAL  3. Pt will demo reciprocal gait pattern, longer stride length  in order to ambulate safely in community and having a improved walking routine  Baseline:  decreased R stance, minimal L posterior thoracic rotation Goal status: INITIAL    4.  Pt will report decreased manually assisting to place rectal prolapse to once every 2 hours or decreased frequency by 50% per day  Baseline:  Pt is manually assisting to place rectal prolapse every hour ,  Goal status: INITIAL   5. Pt will be compliant with increased water intake and decreased soda, ice tea to promote bladder and bladder health  Baseline:  Daily fluid intake: 8-12 fl oz water, diet coke 80 fl oz, 8 fl oz ice tea.  Goal status: INITIAL  6. Pt will report no more straining with BMs  > 75% of the time  in order to minimize worsening prolapse  Baseline: Initially need to strain with each bowel movement , pt has to stretch.  Pt is scheduling and asking herself to have a BM in the morning. It takes 15 min. Goal status: INITIAL   7. Pt will demo improved score by > 2 pt change with [place] Sleep Disorder Index to improve sleep hygiene , improve immune functions as a cancer survivor  Baseline:  plan to assess  Goal Status : INITIAL   8. Pt will demo improved upright posture with decreased distance from earlobe to  wall , demo less thoracic kyphosis and will be compliant with HEP for strenghtening deep core and posterior deep musculature for cervico-scapulo-thoracic-lumbar -glut LKC chain in order to promote cardiovascular function ( Hx of stents, CAD)  and promote improved sleep positioning  Baseline: earlobe to wall, 23cm to wall , 15 cm heel to wall  Goal Status : INITIAL    9. Pt will improve distance on > 1000 ft to improve her walking routine of 10 min with improved cardio function, build core mm with maintaining walking routine  Baseline:   plan to assess  , Perceived Rate of Exertion score  Goal Status : INITIAL       Mariane Masters, PT 01/12/2024, 8:18 AM

## 2024-01-12 NOTE — Patient Instructions (Signed)
  Work stretches:   Armed forces training and education officer,   Palms shoulder width apart  Minisquat postion Trunk is parallel to floor  A) Pull buttocks back to lengthen spine, knees bent  3 breaths   B) Bring R hand to the L, and stretch the R side trunk  3 breaths   Brings hands to center again Do the same to the L side stretch by placing L hand on top of R   C)  Hand on R hip ,  R elbow back and twist to look up at ceiling R  Do the same to other side    Home:    A) Childs pose rocking   Toes tucked, shoulders down and back, on forearms , hands shoulder width apart, fingers straight, elbow back , squeeze imaginary pencils in armpit, shoulder down and away from ears  10 reps    B) 3 way childs pose    - R hand by L hand for R side stretch    - other side  3 breath each     C)  TABLE TOP POSITION Hand on R hip ,  R elbow back and twist to look up at ceiling R  Do the same to other side   Hands behind back  Minisquat Shift knees forward, chest raise and lifts up to sky Gaze up to sky as you pulls hands down     Segmental mobility of the spine:  Table top wrists under shoudlers, knees under hips  Cat: belly button draws up to ceiling, midback hunches, chin tuck Cow: chin away, shoulders blader sink, tailbone up      Seated:  Rainbow stretch - L hand on chair seat by seat , R side reach overhead, elbow back, twist to up up at sky     Cat cow - hands drag on thigh , elbows back, chest lifts

## 2024-01-17 ENCOUNTER — Other Ambulatory Visit: Payer: Self-pay | Admitting: Internal Medicine

## 2024-01-17 ENCOUNTER — Other Ambulatory Visit: Payer: Self-pay

## 2024-01-17 DIAGNOSIS — Z79890 Hormone replacement therapy (postmenopausal): Secondary | ICD-10-CM

## 2024-01-18 ENCOUNTER — Other Ambulatory Visit (HOSPITAL_COMMUNITY): Payer: Self-pay

## 2024-01-18 ENCOUNTER — Other Ambulatory Visit: Payer: Self-pay

## 2024-01-18 NOTE — Progress Notes (Signed)
 Specialty Pharmacy Refill Coordination Note  Kendra Salas is a 73 y.o. adult contacted today regarding refills of specialty medication(s) Dupilumab (Dupixent)   Patient requested Delivery   Delivery date: 01/26/24   Verified address: [address] [place] [address]   Medication will be filled on 01/25/24.

## 2024-01-18 NOTE — Telephone Encounter (Signed)
 Please review

## 2024-01-18 NOTE — Progress Notes (Signed)
 Specialty Pharmacy Ongoing Clinical Assessment Note  Kendra Salas is a 73 y.o. adult who is being followed by the specialty pharmacy service for RxSp Atopic Dermatitis   Patient's specialty medication(s) reviewed today: Dupilumab (Dupixent)   Missed doses in the last 4 weeks: 0   Patient/Caregiver did not have any additional questions or concerns.   Therapeutic benefit summary: Patient is achieving benefit   Adverse events/side effects summary: No adverse events/side effects   Patient's therapy is appropriate to: Continue    Goals Addressed             This Visit's Progress    Reduce signs and symptoms   On track    Patient is on track. Patient will maintain adherence.  Dr. Rochelle Chu remains very well-controlled on therapy.         Follow up:  6 months  Malachi Screws Specialty Pharmacist

## 2024-01-19 ENCOUNTER — Ambulatory Visit: Admitting: Physical Therapy

## 2024-01-19 DIAGNOSIS — R2689 Other abnormalities of gait and mobility: Secondary | ICD-10-CM

## 2024-01-19 DIAGNOSIS — M533 Sacrococcygeal disorders, not elsewhere classified: Secondary | ICD-10-CM

## 2024-01-19 DIAGNOSIS — M4004 Postural kyphosis, thoracic region: Secondary | ICD-10-CM

## 2024-01-19 NOTE — Therapy (Signed)
 OUTPATIENT PHYSICAL THERAPY TREATMENT    Patient Name: Kendra Salas MRN: [id number] DOB:Nov 24, 1950, 73 y.o., adult Today's Date: 01/19/2024   PT End of Session - 01/19/24 1342     Visit Number 5    Number of Visits 10    Date for PT Re-Evaluation 03/01/24    PT Start Time 1332    PT Stop Time 1415    PT Time Calculation (min) 43 min    Activity Tolerance Patient tolerated treatment well;No increased pain    Behavior During Therapy [facility] for tasks assessed/performed             Past Medical History:  Diagnosis Date   Anxiety    Basal cell carcinoma 03/07/2008   left dorsum mid forearm   Basal cell carcinoma 04/19/2013   left distal lat ant thigh   Basal cell carcinoma 04/11/2014   right forearm, right post shoulder near deltoid   Basal cell carcinoma 10/12/2018   left mid back 7.0 cm lat to spine   Basal cell carcinoma 06/16/2023   R forehead, ED&C   CAD (coronary artery disease)    Clotting disorder (HCC)    Dysplastic nevus 02/02/2007   left volar mid to prox forearm   Dysplastic nevus 07/09/2010   right medial breast 1.5 cm med to areola, right med breast parasternal, right post distal deltoid   Eczema    GERD (gastroesophageal reflux disease)    Hyperlipemia    Hypertension    Lymphoma (HCC) 2015   Melanoma in situ (HCC) 12/22/2006   right medial breast, med sup quad   Personal history of chemotherapy 2017   6 months of chemo   Past Surgical History:  Procedure Laterality Date   AUGMENTATION MAMMAPLASTY Bilateral 2003   CARDIAC CATHETERIZATION     X3 - has 9 stents. 4 LAD, 1 posterior, Most recent cath 03/2021   COLONOSCOPY  06/29/2012   repeat 10 yrs - Dr. Bluford Kaufmann   COLONOSCOPY WITH PROPOFOL N/A 08/07/2022   Procedure: COLONOSCOPY WITH PROPOFOL;  Surgeon: Midge Minium, MD;  Location: [facility];  Service: Endoscopy;  Laterality: N/A;  needs to be first case   CORONARY IMAGING/OCT N/A 07/07/2023   Procedure: CORONARY IMAGING/OCT;  Surgeon: Iran Ouch, MD;  Location: [facility];  Service: Cardiovascular;  Laterality: N/A;   CORONARY LITHOTRIPSY N/A 07/07/2023   Procedure: CORONARY LITHOTRIPSY;  Surgeon: Iran Ouch, MD;  Location: [facility];  Service: Cardiovascular;  Laterality: N/A;   CORONARY LITHOTRIPSY N/A 10/20/2023   Procedure: CORONARY LITHOTRIPSY;  Surgeon: Iran Ouch, MD;  Location: [facility];  Service: Cardiovascular;  Laterality: N/A;   CORONARY STENT INTERVENTION N/A 07/07/2023   Procedure: CORONARY STENT INTERVENTION;  Surgeon: Iran Ouch, MD;  Location: [facility];  Service: Cardiovascular;  Laterality: N/A;   CORONARY STENT INTERVENTION N/A 10/20/2023   Procedure: CORONARY STENT INTERVENTION;  Surgeon: Iran Ouch, MD;  Location: [facility];  Service: Cardiovascular;  Laterality: N/A;   CORONARY ULTRASOUND/IVUS N/A 10/20/2023   Procedure: Coronary Ultrasound/IVUS;  Surgeon: Iran Ouch, MD;  Location: [facility];  Service: Cardiovascular;  Laterality: N/A;   ESOPHAGOGASTRODUODENOSCOPY (EGD) WITH PROPOFOL N/A 11/22/2015   Procedure: ESOPHAGOGASTRODUODENOSCOPY (EGD) WITH PROPOFOL;  Surgeon: Midge Minium, MD;  Location: [facility];  Service: Endoscopy;  Laterality: N/A;   FACIAL COSMETIC SURGERY     gender re-affirmation     HERNIA REPAIR  x2   POLYPECTOMY  08/07/2022   Procedure: POLYPECTOMY;  Surgeon: Midge Minium, MD;  Location: [facility];  Service: Endoscopy;;   PORTA CATH REMOVAL N/A 04/10/2019   Procedure: PORTA CATH REMOVAL;  Surgeon: Annice Needy, MD;  Location: [facility];  Service: Cardiovascular;  Laterality: N/A;   RIGHT/LEFT HEART CATH AND CORONARY ANGIOGRAPHY Bilateral 06/18/2023   Procedure: RIGHT/LEFT HEART CATH AND CORONARY ANGIOGRAPHY;  Surgeon: Iran Ouch, MD;  Location: [facility];  Service: Cardiovascular;  Laterality: Bilateral;   Patient Active Problem List   Diagnosis  Date Noted   Anxiety with depression 11/03/2023   Rectal prolapse 11/03/2023   Chest pain 07/07/2023   Acquired thrombophilia (HCC) 01/12/2023   Polyp of descending colon 08/07/2022   S/P angioplasty with stent 03/17/2021   Hx of melanoma of skin 05/22/2020   Psoriasis 03/30/2018   Mixed hyperlipidemia 03/31/2016   Dyspnea on exertion 03/31/2016   Screen for colon cancer    Benign neoplasm of ascending colon    CAD (coronary artery disease) 03/03/2015   Diverticulosis 03/03/2015   Benign essential HTN 03/03/2015   Diffuse large B-cell lymphoma of lymph nodes of axilla (HCC) 02/10/2015    PCP: Judithann Graves  REFERRING PROVIDER: Claudine Mouton  REFERRING DIAG:  K62.3 (ICD-10-CM) - Rectal prolapse  R15.9 (ICD-10-CM) - Full incontinence of feces    Rationale for Evaluation and Treatment Rehabilitation  THERAPY DIAG:  Sacrococcygeal disorders, not elsewhere classified  Postural kyphosis of thoracic region  Other abnormalities of gait and mobility  Postural kyphosis of thoracic  ONSET DATE: 6 months ago  SUBJECTIVE:       SUBJECTIVE STATEMENT TODAY:  Pt has been doing her neck movement exercise while on her walking routine.    Pt has been doing more childs pose at counter more throughout the day                                                                                                                                                                  SUBJECTIVE STATEMENT ON EVAL 12/22/23:  1) rectal prolapse:  pt notices it 6 months ago   Pt is manually assisting to place rectal prolapse every hour ,   2) Straining with bowel movements:   uses Metamucil and Miralax and scheduling bowel movement morning 6:30am ,  With laxative: Stool Type  Type 4 , before Type 5 . Initially need to strain with each bowel movement , pt has to stretch.  Pt is scheduling and asking herself to have a BM in the morning. It takes 15 min. No issues with prolapse blocking bowel movements.   Pt has not  met with nutritionist. Pt is eating more cereal and raisin bran. Daily fluid intake: 8-12 fl oz  water, diet coke 80 fl oz, 8 fl oz ice tea. Pt does not have nocturia.  Pt goes to urinate in the middle of the night before going back to bed  Urination frequency is more related to "bladder shy" , tries to evacuate often, once every 2 hours  Medical Hx:  2003 gender affirmation surgery,pt was dilating her neovagina but reports it is not deep as it used to be  Hx of CAD with 10 cardiac stents , 8 are in LAD, 2  Walk 10 min at lunch  Did not cardiac rehab   [place] Lymphoma with chemo Tx 6 years ago. No changes to skin tissue at pelvic floor.    Fitness routine consists of 10 min walk during lunch break  Sleep cycle interrupted: Goes to bed at 8pm , gets up at midnight to assist with spouse with hoyer lift  from [facility]. Which pt has had to do for the past 8 years   Charchot-Marie-tooth disease.  She is IND with bathroom. 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 One of her patient room, she turns R and the other room L.      PERTINENT HISTORY:  See above  PAIN:  Are you having pain? No   PRECAUTIONS: No   WEIGHT BEARING RESTRICTIONS: No   FALLS:  Has patient fallen in last 6 months? No  LIVING ENVIRONMENT:  Lives with: spouse Lives in: 2 story , ramp to enter front door  Stairs:  2 flights  , 16 steps total  Has following equipment at home: for spouse uses a Nurse, adult    OCCUPATION: physician   PLOF: IND   PATIENT GOALS:  "I do not want any accidents" . Miralax and fiber has improved that.  Pt os making the effort at 6am - 6:30am for bowel movement without straining.  "Keep rectal prolapse within me and not having to check on it every hour"    OBJECTIVE:                                                      [facility] PT Assessment -  01/19/24 1344       Observation/Other Assessments   Scoliosis R shoulder slightly higher, Levelled pelvis      Coordination   Coordination and Movement Description poor coordination with last HEP ( requied review)      Palpation   Spinal mobility less thoracic kyphosis, less rounded shoudlers    Palpation comment tightness along intercostals posterior T7-10 R, medial scapula at C/T junction                [facility] Adult PT Treatment/Exercise - 01/19/24 1509       Therapeutic Activites    Other Therapeutic Activities pt reported doing her neck exericses while she is walking but advised pt to discontinue for safety, provided alternative with arm movements instead while on her walking routine      Neuro Re-ed    Neuro Re-ed Details  cued for past HEP for correct technique and position, cued for deep core level 1 with excessive cues for pelvic propioception with pelvic tilts without ab  overuse, cued for diaphragmatic excursion with less chest/ upper trap mm overuse      Modalities   Modalities Moist Heat      Manual Therapy   Manual therapy comments STM/MWM at problem areas noted in asessment to minimize tightness and further minimize forward head/ thoracic kyphosis                HOME EXERCISE PROGRAM: See pt instruction section    ASSESSMENT:  CLINICAL IMPRESSION:  Following Tx today which pt tolerated without complaints,  pt demo'd improved intercostal posterior excursion at R thoracic region.  Advanced to deep core training but pt required cues for deep core level 1 with excessive cues for pelvic propioception with pelvic tilts without ab overuse, cued for diaphragmatic excursion with less chest/ upper trap mm overuse  . PLan to advance to deep core level 2 at next session.    Cued for past HEP for correct technique and position as pt demonstrated incorrect form.  Pt demo'd form correctly post Tx.     These improvements will  help promote optimize IAP system  for improved pelvic floor function, trunk stability, gait, balance, stabilization with mobility tasks.  Plan to address pelvic floor issues once pelvis and spine are realigned to yield better outcomes.  Plan to administer PSDI outcome measure at next session.   Pt benefits from skilled PT.   OBJECTIVE IMPAIRMENTS decreased activity tolerance, decreased coordination, decreased endurance, decreased mobility, difficulty walking, decreased ROM, decreased strength, decreased safety awareness, hypomobility, increased muscle spasms, impaired flexibility, improper body mechanics, postural dysfunction,   ACTIVITY LIMITATIONS  self-care,  sleep, home chores, work tasks    PARTICIPATION LIMITATIONS:  community    PERSONAL FACTORS   interrupted sleep, low water intake, high soda intake, posture are also affecting patient's functional outcome.    REHAB POTENTIAL: Good   CLINICAL DECISION MAKING:  High complexity   EVALUATION COMPLEXITY: High    PATIENT EDUCATION:    Education details: Showed pt anatomy images. Explained muscles attachments/ connection, physiology of deep core system/ spinal- thoracic-pelvis-lower kinetic chain as they relate to pt's presentation, Sx, and past Hx. Explained what and how these areas of deficits need to be restored to balance and function    See Therapeutic activity / neuromuscular re-education section  Answered pt's questions.   Person educated: Patient Education method: Explanation, Demonstration, Tactile cues, Verbal cues, and Handouts Education comprehension: verbalized understanding, returned demonstration, verbal cues required, tactile cues required, and needs further education     PLAN: PT FREQUENCY: 1x/week   PT DURATION: 10 weeks   PLANNED INTERVENTIONS: Therapeutic exercises, Therapeutic activity, Neuromuscular re-education, Balance training, Gait training, Patient/Family education, Self Care, Joint mobilization, Spinal mobilization, Moist heat,  Taping, and Manual therapy, dry needling.   PLAN FOR NEXT SESSION: See clinical impression for plan     GOALS: Goals reviewed with patient? Yes  SHORT TERM GOALS: Target date: 01/19/2024    Pt will demo IND with HEP                    Baseline: Not IND            Goal status: INITIAL   LONG TERM GOALS: Target date: 03/01/2024    1.Pt will demo proper deep core coordination without chest breathing and optimal excursion of diaphragm/pelvic floor in order to promote spinal stability and pelvic floor function  Baseline: dyscoordination Goal status: INITIAL  2.  Pt will demo proper body mechanics in against  gravity tasks and ADLs ,  work tasks, fitness  to minimize straining pelvic floor / back    Baseline: not IND, improper form that places strain on pelvic floor  Goal status: INITIAL  3. Pt will demo reciprocal gait pattern, longer stride length  in order to ambulate safely in community and having a improved walking routine  Baseline:  decreased R stance, minimal L posterior thoracic rotation Goal status: INITIAL    4.  Pt will report decreased manually assisting to place rectal prolapse to once every 2 hours or decreased frequency by 50% per day  Baseline:  Pt is manually assisting to place rectal prolapse every hour ,  Goal status: INITIAL   5. Pt will be compliant with increased water intake and decreased soda, ice tea to promote bladder and bladder health  Baseline:  Daily fluid intake: 8-12 fl oz water, diet coke 80 fl oz, 8 fl oz ice tea.  Goal status: INITIAL  6. Pt will report no more straining with BMs  > 75% of the time  in order to minimize worsening prolapse  Baseline: Initially need to strain with each bowel movement , pt has to stretch.  Pt is scheduling and asking herself to have a BM in the morning. It takes 15 min. Goal status: INITIAL   7. Pt will demo improved score by > 2 pt change with [place] Sleep Disorder Index to improve sleep hygiene , improve  immune functions as a cancer survivor  Baseline:  plan to assess  Goal Status : INITIAL   8. Pt will demo improved upright posture with decreased distance from earlobe to wall , demo less thoracic kyphosis and will be compliant with HEP for strenghtening deep core and posterior deep musculature for cervico-scapulo-thoracic-lumbar -glut LKC chain in order to promote cardiovascular function ( Hx of stents, CAD)  and promote improved sleep positioning  Baseline: earlobe to wall, 23cm to wall , 15 cm heel to wall  Goal Status : INITIAL    9. Pt will improve distance on > 1000 ft to improve her walking routine of 10 min with improved cardio function, build core mm with maintaining walking routine  Baseline:   plan to assess  , Perceived Rate of Exertion score  Goal Status : INITIAL       Modesto Andreas, PT 01/19/2024, 3:12 PM

## 2024-01-19 NOTE — Patient Instructions (Signed)
    Motion is lotion  - good morning and evening     On your side - winging and brushing   Pillow between knees and behind back     On your back with towel roll under the neck  - neck 6 directions - angel wings - ( dragging arms)  - ZigZag stretch scoot hips to one side and rock knees to opposite, arms by side ,palms up      On your other side - winging and brushing   Pillow between knees and behind back                 Deep core level 1 only

## 2024-01-25 ENCOUNTER — Other Ambulatory Visit: Payer: Self-pay

## 2024-01-26 ENCOUNTER — Ambulatory Visit: Admitting: Physical Therapy

## 2024-01-26 DIAGNOSIS — E871 Hypo-osmolality and hyponatremia: Secondary | ICD-10-CM | POA: Diagnosis not present

## 2024-01-27 ENCOUNTER — Encounter: Admitting: Physical Therapy

## 2024-02-02 ENCOUNTER — Ambulatory Visit: Admitting: Physical Therapy

## 2024-02-02 DIAGNOSIS — M4004 Postural kyphosis, thoracic region: Secondary | ICD-10-CM | POA: Diagnosis not present

## 2024-02-02 DIAGNOSIS — R2689 Other abnormalities of gait and mobility: Secondary | ICD-10-CM

## 2024-02-02 DIAGNOSIS — M533 Sacrococcygeal disorders, not elsewhere classified: Secondary | ICD-10-CM

## 2024-02-02 NOTE — Patient Instructions (Addendum)
   QUICK KEGEL PROGRAM  Exercise check off  All the exercises on your back in the morning and night     Mon Tue Wed Thurs Fri  Sat Sun  Before deep core level 1:   _4_ reps of quick   pelvic floor squeeze   lying on your side with anterior tilt position   Deep core level 1 ( on your side ) anterior tilt position to avoid overuse of abdomen                                Before deep core level 2  _4_ reps of quick   pelvic floor squeeze   lying on your side with anterior tilt position   Deep core level 2 ( seated) on folded towel for anterior tilt of pelvis                               After  deep core level 2 _3_ reps of quick   pelvic floor squeeze lying on your side with anterior tilt position                           Standing pelvic tilit exercises , press feet into ground at [place]

## 2024-02-02 NOTE — Therapy (Signed)
 OUTPATIENT PHYSICAL THERAPY TREATMENT    Patient Name: Kendra Salas MRN: [id number] DOB:Mar 06, 1951, 73 y.o., adult Today's Date: 02/02/2024   PT End of Session - 02/02/24 1341     Visit Number 6    Number of Visits 10    Date for PT Re-Evaluation 03/01/24    PT Start Time 1340    PT Stop Time 1420    PT Time Calculation (min) 40 min    Activity Tolerance Patient tolerated treatment well;No increased pain    Behavior During Therapy [facility] for tasks assessed/performed             Past Medical History:  Diagnosis Date   Anxiety    Basal cell carcinoma 03/07/2008   left dorsum mid forearm   Basal cell carcinoma 04/19/2013   left distal lat ant thigh   Basal cell carcinoma 04/11/2014   right forearm, right post shoulder near deltoid   Basal cell carcinoma 10/12/2018   left mid back 7.0 cm lat to spine   Basal cell carcinoma 06/16/2023   R forehead, ED&C   CAD (coronary artery disease)    Clotting disorder (HCC)    Dysplastic nevus 02/02/2007   left volar mid to prox forearm   Dysplastic nevus 07/09/2010   right medial breast 1.5 cm med to areola, right med breast parasternal, right post distal deltoid   Eczema    GERD (gastroesophageal reflux disease)    Hyperlipemia    Hypertension    Lymphoma (HCC) 2015   Melanoma in situ (HCC) 12/22/2006   right medial breast, med sup quad   Personal history of chemotherapy 2017   6 months of chemo   Past Surgical History:  Procedure Laterality Date   AUGMENTATION MAMMAPLASTY Bilateral 2003   CARDIAC CATHETERIZATION     X3 - has 9 stents. 4 LAD, 1 posterior, Most recent cath 03/2021   COLONOSCOPY  06/29/2012   repeat 10 yrs - Dr. Janine Melbourne   COLONOSCOPY WITH PROPOFOL  N/A 08/07/2022   Procedure: COLONOSCOPY WITH PROPOFOL ;  Surgeon: Marnee Sink, MD;  Location: [facility];  Service: Endoscopy;  Laterality: N/A;  needs to be first case   CORONARY IMAGING/OCT N/A 07/07/2023   Procedure: CORONARY IMAGING/OCT;  Surgeon: Wenona Hamilton, MD;  Location: [facility];  Service: Cardiovascular;  Laterality: N/A;   CORONARY LITHOTRIPSY N/A 07/07/2023   Procedure: CORONARY LITHOTRIPSY;  Surgeon: Wenona Hamilton, MD;  Location: [facility];  Service: Cardiovascular;  Laterality: N/A;   CORONARY LITHOTRIPSY N/A 10/20/2023   Procedure: CORONARY LITHOTRIPSY;  Surgeon: Wenona Hamilton, MD;  Location: [facility];  Service: Cardiovascular;  Laterality: N/A;   CORONARY STENT INTERVENTION N/A 07/07/2023   Procedure: CORONARY STENT INTERVENTION;  Surgeon: Wenona Hamilton, MD;  Location: [facility];  Service: Cardiovascular;  Laterality: N/A;   CORONARY STENT INTERVENTION N/A 10/20/2023   Procedure: CORONARY STENT INTERVENTION;  Surgeon: Wenona Hamilton, MD;  Location: [facility];  Service: Cardiovascular;  Laterality: N/A;   CORONARY ULTRASOUND/IVUS N/A 10/20/2023   Procedure: Coronary Ultrasound/IVUS;  Surgeon: Wenona Hamilton, MD;  Location: [facility];  Service: Cardiovascular;  Laterality: N/A;   ESOPHAGOGASTRODUODENOSCOPY (EGD) WITH PROPOFOL  N/A 11/22/2015   Procedure: ESOPHAGOGASTRODUODENOSCOPY (EGD) WITH PROPOFOL ;  Surgeon: Marnee Sink, MD;  Location: [facility];  Service: Endoscopy;  Laterality: N/A;   FACIAL COSMETIC SURGERY     gender re-affirmation     HERNIA REPAIR  x2   POLYPECTOMY  08/07/2022   Procedure: POLYPECTOMY;  Surgeon: Marnee Sink, MD;  Location: [facility];  Service: Endoscopy;;   PORTA CATH REMOVAL N/A 04/10/2019   Procedure: PORTA CATH REMOVAL;  Surgeon: Celso College, MD;  Location: [facility];  Service: Cardiovascular;  Laterality: N/A;   RIGHT/LEFT HEART CATH AND CORONARY ANGIOGRAPHY Bilateral 06/18/2023   Procedure: RIGHT/LEFT HEART CATH AND CORONARY ANGIOGRAPHY;  Surgeon: Wenona Hamilton, MD;  Location: [facility];  Service: Cardiovascular;  Laterality: Bilateral;   Patient Active Problem List   Diagnosis  Date Noted   Anxiety with depression 11/03/2023   Rectal prolapse 11/03/2023   Chest pain 07/07/2023   Acquired thrombophilia (HCC) 01/12/2023   Polyp of descending colon 08/07/2022   S/P angioplasty with stent 03/17/2021   Hx of melanoma of skin 05/22/2020   Psoriasis 03/30/2018   Mixed hyperlipidemia 03/31/2016   Dyspnea on exertion 03/31/2016   Screen for colon cancer    Benign neoplasm of ascending colon    CAD (coronary artery disease) 03/03/2015   Diverticulosis 03/03/2015   Benign essential HTN 03/03/2015   Diffuse large B-cell lymphoma of lymph nodes of axilla (HCC) 02/10/2015    PCP: Gala Jubilee  REFERRING PROVIDER: Ofilia Benton  REFERRING DIAG:  K62.3 (ICD-10-CM) - Rectal prolapse  R15.9 (ICD-10-CM) - Full incontinence of feces    Rationale for Evaluation and Treatment Rehabilitation  THERAPY DIAG:  Sacrococcygeal disorders, not elsewhere classified  Postural kyphosis of thoracic region  Other abnormalities of gait and mobility  Postural kyphosis of thoracic  ONSET DATE: 6 months ago  SUBJECTIVE:       SUBJECTIVE STATEMENT TODAY:  Pt reports not needing to check on prolapse / fecal leakage every hour, only every two hours now.  Pt had only one fecal leakage the past week compared to daily.                                                                                                                                                           SUBJECTIVE STATEMENT ON EVAL 12/22/23:  1) rectal prolapse:  pt notices it 6 months ago   Pt is manually assisting to place rectal prolapse every hour ,   2) Straining with bowel movements:   uses Metamucil and Miralax and scheduling bowel movement morning 6:30am ,  With laxative: Stool Type  Type 4 , before Type 5 . Initially need to strain with each bowel movement , pt has to stretch.  Pt is scheduling and asking herself to have a BM in the morning. It takes 15 min. No issues with prolapse blocking bowel movements.   Pt  has not met with nutritionist. Pt is eating more cereal and raisin bran. Daily fluid intake: 8-12 fl oz water , diet coke 80 fl  oz, 8 fl oz ice tea. Pt does not have nocturia.  Pt goes to urinate in the middle of the night before going back to bed  Urination frequency is more related to "bladder shy" , tries to evacuate often, once every 2 hours  Medical Hx:  2003 gender affirmation surgery,pt was dilating her neovagina but reports it is not deep as it used to be  Hx of CAD with 10 cardiac stents , 8 are in LAD, 2  Walk 10 min at lunch  Did not cardiac rehab   [place] Lymphoma with chemo Tx 6 years ago. No changes to skin tissue at pelvic floor.    Fitness routine consists of 10 min walk during lunch break  Sleep cycle interrupted: Goes to bed at 8pm , gets up at midnight to assist with spouse with hoyer lift  from [facility]. Which pt has had to do for the past 8 years   Charchot-Marie-tooth disease.  She is IND with bathroom. 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 One of her patient room, she turns R and the other room L.      PERTINENT HISTORY:  See above  PAIN:  Are you having pain? No   PRECAUTIONS: No   WEIGHT BEARING RESTRICTIONS: No   FALLS:  Has patient fallen in last 6 months? No  LIVING ENVIRONMENT:  Lives with: spouse Lives in: 2 story , ramp to enter front door  Stairs:  2 flights  , 16 steps total  Has following equipment at home: for spouse uses a Nurse, adult    OCCUPATION: physician   PLOF: IND   PATIENT GOALS:  "I do not want any accidents" . Miralax and fiber has improved that.  Pt os making the effort at 6am - 6:30am for bowel movement without straining.  "Keep rectal prolapse within me and not having to check on it every hour"    OBJECTIVE:                                                    [facility] PT Assessment -  02/02/24 1531       Coordination   Coordination and Movement Description difficulty in hooklying to achieve anterior tilt of pelvis, modifiied to sidelying for quick kegels and deep core level 1 and to seated for level 2.             [facility] Adult PT Treatment/Exercise - 02/02/24 1533       Therapeutic Activites    Other Therapeutic Activities showed anatomy and explained kegel program and progressions,      Neuro Re-ed    Neuro Re-ed Details  excessive cues for less ab overuse in deep core and kegel training , cued for pelvic floor coordination for quick contractions , cued for anterior tilt pelvic propceioption in standing position              HOME EXERCISE PROGRAM: See pt instruction section    ASSESSMENT:  CLINICAL IMPRESSION:   Improvements include:     Pt reports not needing to check on prolapse / fecal leakage every hour, only every two hours now.  Pt had only one fecal leakage the past week compared to daily.    Pt showed more upright posture and regaining more lumbar lordosis to achieve anterior tilt of pelvis. However, pt required excessive cues for anterior tilt of pelvis in standing position and hooklying. Pt required modification to sidelying / seated position instead of hooklying for deep core HEP to ensure more anterior tilt of pelvis. Pt progressed to quick kegel contractions but required excessive cues for less ab overuse and to correct dyscoordination. Pt achieved 4 quick contractions correctly post training.    Discussed internal rectal assessment for prolapse at next session.Plan to progress to endurance contractions kegels next session  These improvements will  help promote optimize IAP system for improved pelvic floor function, trunk stability, gait, balance, stabilization with mobility tasks.  Plan to address pelvic floor issues once pelvis and spine are realigned to yield better outcomes.  Plan to administer PSDI outcome measure at next session.    Pt benefits from skilled PT.   OBJECTIVE IMPAIRMENTS decreased activity tolerance, decreased coordination, decreased endurance, decreased mobility, difficulty walking, decreased ROM, decreased strength, decreased safety awareness, hypomobility, increased muscle spasms, impaired flexibility, improper body mechanics, postural dysfunction,   ACTIVITY LIMITATIONS  self-care,  sleep, home chores, work tasks    PARTICIPATION LIMITATIONS:  community    PERSONAL FACTORS   interrupted sleep, low water  intake, high soda intake, posture are also affecting patient's functional outcome.    REHAB POTENTIAL: Good   CLINICAL DECISION MAKING:  High complexity   EVALUATION COMPLEXITY: High    PATIENT EDUCATION:    Education details: Showed pt anatomy images. Explained muscles attachments/ connection, physiology of deep core system/ spinal- thoracic-pelvis-lower kinetic chain as they relate to pt's presentation, Sx, and past Hx. Explained what and how these areas of deficits need to be restored to balance and function    See Therapeutic activity / neuromuscular re-education section  Answered pt's questions.   Person educated: Patient Education method: Explanation, Demonstration, Tactile cues, Verbal cues, and Handouts Education comprehension: verbalized understanding, returned demonstration, verbal cues required, tactile cues required, and needs further education     PLAN: PT FREQUENCY: 1x/week   PT DURATION: 10 weeks   PLANNED INTERVENTIONS: Therapeutic exercises, Therapeutic activity, Neuromuscular re-education, Balance training, Gait training, Patient/Family education, Self Care, Joint mobilization, Spinal mobilization, Moist heat, Taping, and Manual therapy, dry needling.   PLAN FOR NEXT SESSION: See clinical impression for plan     GOALS: Goals reviewed with patient? Yes  SHORT TERM GOALS: Target date: 01/19/2024    Pt will demo IND with HEP                    Baseline: Not IND             Goal status: INITIAL   LONG TERM GOALS: Target date: 03/01/2024    1.Pt will demo proper deep core coordination without chest breathing and optimal excursion of diaphragm/pelvic floor in order to promote spinal stability and pelvic floor function  Baseline: dyscoordination Goal status: INITIAL  2.  Pt will demo proper body mechanics in against gravity tasks and ADLs ,  work tasks, fitness  to minimize straining pelvic floor / back    Baseline: not IND, improper form that places strain on pelvic floor  Goal status: INITIAL  3. Pt will demo reciprocal gait pattern, longer stride length  in order to ambulate safely in community and having a improved walking routine  Baseline:  decreased R  stance, minimal L posterior thoracic rotation Goal status: INITIAL    4.  Pt will report decreased manually assisting to place rectal prolapse to once every 2 hours or decreased frequency by 50% per day  Baseline:  Pt is manually assisting to place rectal prolapse every hour ,  Goal status: INITIAL   5. Pt will be compliant with increased water  intake and decreased soda, ice tea to promote bladder and bladder health  Baseline:  Daily fluid intake: 8-12 fl oz water , diet coke 80 fl oz, 8 fl oz ice tea.  Goal status: INITIAL  6. Pt will report no more straining with BMs  > 75% of the time  in order to minimize worsening prolapse  Baseline: Initially need to strain with each bowel movement , pt has to stretch.  Pt is scheduling and asking herself to have a BM in the morning. It takes 15 min. Goal status: INITIAL   7. Pt will demo improved score by > 2 pt change with [place] Sleep Disorder Index to improve sleep hygiene , improve immune functions as a cancer survivor  Baseline:  plan to assess  Goal Status : INITIAL   8. Pt will demo improved upright posture with decreased distance from earlobe to wall , demo less thoracic kyphosis and will be compliant with HEP for strenghtening deep  core and posterior deep musculature for cervico-scapulo-thoracic-lumbar -glut LKC chain in order to promote cardiovascular function ( Hx of stents, CAD)  and promote improved sleep positioning  Baseline: earlobe to wall, 23cm to wall , 15 cm heel to wall  Goal Status : INITIAL    9. Pt will improve distance on > 1000 ft to improve her walking routine of 10 min with improved cardio function, build core mm with maintaining walking routine  Baseline:   plan to assess  , Perceived Rate of Exertion score  Goal Status : INITIAL       Modesto Andreas, PT 02/02/2024, 1:42 PM

## 2024-02-03 ENCOUNTER — Other Ambulatory Visit: Payer: Self-pay

## 2024-02-03 MED FILL — Clopidogrel Bisulfate Tab 75 MG (Base Equiv): ORAL | 90 days supply | Qty: 90 | Fill #1 | Status: CN

## 2024-02-04 ENCOUNTER — Other Ambulatory Visit: Payer: Self-pay | Admitting: Internal Medicine

## 2024-02-04 DIAGNOSIS — Z79890 Hormone replacement therapy (postmenopausal): Secondary | ICD-10-CM

## 2024-02-07 ENCOUNTER — Other Ambulatory Visit: Payer: Self-pay

## 2024-02-07 ENCOUNTER — Other Ambulatory Visit: Payer: Self-pay | Admitting: Internal Medicine

## 2024-02-07 DIAGNOSIS — Z79890 Hormone replacement therapy (postmenopausal): Secondary | ICD-10-CM

## 2024-02-08 ENCOUNTER — Other Ambulatory Visit: Payer: Self-pay

## 2024-02-08 MED ORDER — SERTRALINE HCL 25 MG PO TABS
25.0000 mg | ORAL_TABLET | Freq: Every day | ORAL | 1 refills | Status: AC
  Filled 2024-02-08: qty 30, 30d supply, fill #0
  Filled 2024-03-02: qty 30, 30d supply, fill #1

## 2024-02-08 MED ORDER — ESTRADIOL 2 MG PO TABS
2.0000 mg | ORAL_TABLET | Freq: Every day | ORAL | 0 refills | Status: AC
  Filled 2024-02-08: qty 90, 90d supply, fill #0

## 2024-02-08 MED ORDER — SERTRALINE HCL 50 MG PO TABS
50.0000 mg | ORAL_TABLET | Freq: Every day | ORAL | 1 refills | Status: AC
  Filled 2024-02-08: qty 30, 30d supply, fill #0
  Filled 2024-03-02: qty 30, 30d supply, fill #1

## 2024-02-09 ENCOUNTER — Ambulatory Visit: Attending: Surgery | Admitting: Physical Therapy

## 2024-02-09 DIAGNOSIS — R2689 Other abnormalities of gait and mobility: Secondary | ICD-10-CM | POA: Diagnosis not present

## 2024-02-09 DIAGNOSIS — M4004 Postural kyphosis, thoracic region: Secondary | ICD-10-CM | POA: Insufficient documentation

## 2024-02-09 DIAGNOSIS — M533 Sacrococcygeal disorders, not elsewhere classified: Secondary | ICD-10-CM | POA: Diagnosis not present

## 2024-02-09 NOTE — Patient Instructions (Signed)
 To help with anterior tilt of pelvis and glut strengthening    SKI against chair   In front of chair, knee against seat to line knee above ankle,  back foot hip width apart, push off through ballmounds,  opp arm up, thumbs up, chin tuck, shoulders down  Push off through ballmounds , step back to under hip width   3 min   __  Minisquat: Scoot buttocks back slight, hinge like you are looking at your reflection on a pond  Knees behind toes,  Inhale to "smell flowers"  Exhale on the rise "like rocket"  Do not lock knees, have more weight across ballmounds of feet, toes relaxed and spread them, not grip them   10 reps x 3 x day   __  WHEN USING THE HOYER LIFT,  USE SKI TRACK STANCE AND PUSH THROUGH THE FEET AND NAVEL , elbow by ribs   PICK up feet, PIVOT feet and hips towards the direction change THEN PUSH FROM BACK LEG AND ELBOWS CLOSE TO RIBS   __  Drink more water    __  Kegels 5 reps in table top positions, toes tucked  ( use forearms to reposition)    Deep core level 1 on your side  , pillow between knees  Kegels 5 reps in table top positions, toes tucked  ( use forearms to reposition)    Deep core level 2 ( seated)  6 min with one knee out at a time

## 2024-02-09 NOTE — Telephone Encounter (Signed)
 Refilled 02/08/24. Requested Prescriptions  Refused Prescriptions Disp Refills   estradiol  (ESTRACE ) 2 MG tablet 90 tablet 0    Sig: Take 1 tablet (2 mg total) by mouth daily.     There is no refill protocol information for this order

## 2024-02-09 NOTE — Therapy (Unsigned)
 OUTPATIENT PHYSICAL THERAPY TREATMENT  / Discharge Summary across 7 visits    Patient Name: Kendra Salas MRN: [id number] DOB:[birth year], 73 y.o., adult Today's Date: 02/09/2024   PT End of Session - 02/09/24 0700     Visit Number 7    Number of Visits 10    Date for PT Re-Evaluation 03/01/24    PT Start Time 1335    PT Stop Time 1415    PT Time Calculation (min) 40 min    Activity Tolerance Patient tolerated treatment well;No increased pain    Behavior During Therapy [facility] for tasks assessed/performed             Past Medical History:  Diagnosis Date   Anxiety    Basal cell carcinoma 03/07/2008   left dorsum mid forearm   Basal cell carcinoma 04/19/2013   left distal lat ant thigh   Basal cell carcinoma 04/11/2014   right forearm, right post shoulder near deltoid   Basal cell carcinoma 10/12/2018   left mid back 7.0 cm lat to spine   Basal cell carcinoma 06/16/2023   R forehead, ED&C   CAD (coronary artery disease)    Clotting disorder (HCC)    Dysplastic nevus 02/02/2007   left volar mid to prox forearm   Dysplastic nevus 07/09/2010   right medial breast 1.5 cm med to areola, right med breast parasternal, right post distal deltoid   Eczema    GERD (gastroesophageal reflux disease)    Hyperlipemia    Hypertension    Lymphoma (HCC) 2015   Melanoma in situ (HCC) 12/22/2006   right medial breast, med sup quad   Personal history of chemotherapy 2017   6 months of chemo   Past Surgical History:  Procedure Laterality Date   AUGMENTATION MAMMAPLASTY Bilateral 2003   CARDIAC CATHETERIZATION     X3 - has 9 stents. 4 LAD, 1 posterior, Most recent cath 03/2021   COLONOSCOPY  06/29/2012   repeat 10 yrs - Dr. Janine Melbourne   COLONOSCOPY WITH PROPOFOL  N/A 08/07/2022   Procedure: COLONOSCOPY WITH PROPOFOL ;  Surgeon: Marnee Sink, MD;  Location: [facility];  Service: Endoscopy;  Laterality: N/A;  needs to be first case   CORONARY IMAGING/OCT N/A 07/07/2023   Procedure:  CORONARY IMAGING/OCT;  Surgeon: Wenona Hamilton, MD;  [facility]: [facility];  Service: Cardiovascular;  Laterality: N/A;   CORONARY LITHOTRIPSY N/A 07/07/2023   Procedure: CORONARY LITHOTRIPSY;  Surgeon: Wenona Hamilton, MD;  Location: [facility];  Service: Cardiovascular;  Laterality: N/A;   CORONARY LITHOTRIPSY N/A 10/20/2023   Procedure: CORONARY LITHOTRIPSY;  Surgeon: Wenona Hamilton, MD;  Location: [facility];  Service: Cardiovascular;  Laterality: N/A;   CORONARY STENT INTERVENTION N/A 07/07/2023   Procedure: CORONARY STENT INTERVENTION;  Surgeon: Wenona Hamilton, MD;  [facility]: [facility];  Service: Cardiovascular;  Laterality: N/A;   CORONARY STENT INTERVENTION N/A 10/20/2023   Procedure: CORONARY STENT INTERVENTION;  Surgeon: Wenona Hamilton, MD;  [facility]: [facility];  Service: Cardiovascular;  Laterality: N/A;   CORONARY ULTRASOUND/IVUS N/A 10/20/2023   Procedure: Coronary Ultrasound/IVUS;  Surgeon: Wenona Hamilton, MD;  [facility];  Service: Cardiovascular;  Laterality: N/A;   ESOPHAGOGASTRODUODENOSCOPY (EGD) WITH PROPOFOL  N/A 11/22/2015   Procedure: ESOPHAGOGASTRODUODENOSCOPY (EGD) WITH PROPOFOL ;  Surgeon: Marnee Sink, MD;  Location: [facility];  Service: Endoscopy;  Laterality: N/A;   FACIAL COSMETIC SURGERY     gender re-affirmation  HERNIA REPAIR     x2   POLYPECTOMY  08/07/2022   Procedure: POLYPECTOMY;  Surgeon: Marnee Sink, MD;  Location: [facility];  Service: Endoscopy;;   PORTA CATH REMOVAL N/A 04/10/2019   Procedure: PORTA CATH REMOVAL;  Surgeon: Celso College, MD;  Location: [facility] INVAS[facility] CV LAB;  Service: Cardiovascular;  Laterality: N/A;   RIGHT/LEFT HEART CATH AND CORONARY ANGIOGRAPHY Bilateral 06/18/2023   Procedure: RIGHT/LEFT HEART CATH AND CORONARY ANGIOGRAPHY;  Surgeon: Wenona Hamilton, MD;  Location: [facility] INVASIVE CV LAB;  Service: Cardiovascular;  Laterality: Bilateral;    Patient Active Problem List   Diagnosis Date Noted   Anxiety with depression 11/03/2023   Rectal prolapse 11/03/2023   Chest pain 07/07/2023   Acquired thrombophilia (HCC) 01/12/2023   Polyp of descending colon 08/07/2022   S/P angioplasty with stent 03/17/2021   Hx of melanoma of skin 05/22/2020   Psoriasis 03/30/2018   Mixed hyperlipidemia 03/31/2016   Dyspnea on exertion 03/31/2016   Screen for colon cancer    Benign neoplasm of ascending colon    CAD (coronary artery disease) 03/03/2015   Diverticulosis 03/03/2015   Benign essential HTN 03/03/2015   Diffuse large B-cell lymphoma of lymph nodes of axilla (HCC) 02/10/2015    PCP: Gala Jubilee  REFERRING PROVIDER: Ofilia Benton  REFERRING DIAG:  K62.3 (ICD-10-CM) - Rectal prolapse  R15.9 (ICD-10-CM) - Full incontinence of feces    Rationale for Evaluation and Treatment Rehabilitation  THERAPY DIAG:  Sacrococcygeal disorders, not elsewhere classified  Postural kyphosis of thoracic region  Other abnormalities of gait and mobility  Postural kyphosis of thoracic  ONSET DATE: 6 months ago  SUBJECTIVE:       SUBJECTIVE STATEMENT TODAY:  Pt reports  leakage after last session but no leakage since that accident.   No more straining with bowel movements   SUBJECTIVE STATEMENT ON EVAL 12/22/23:  1) rectal prolapse:  pt notices it 6 months ago   Pt is manually assisting to place rectal prolapse every hour ,   2) Straining with bowel movements:   uses Metamucil and Miralax and scheduling bowel movement morning 6:30am ,  With laxative: Stool Type  Type 4 , before Type 5 . Initially need to strain with each bowel movement , pt has to stretch.  Pt is scheduling and asking herself to have a BM in the morning. It takes 15 min. No issues with prolapse blocking bowel movements.   Pt has not met with nutritionist. Pt is eating more cereal and raisin bran. Daily fluid intake: 8-12 fl oz water , diet coke 80 fl oz, 8 fl oz ice tea. Pt  does not have nocturia.  Pt goes to urinate in the middle of the night before going back to bed  Urination frequency is more related to "bladder shy" , tries to evacuate often, once every 2 hours  Medical Hx:  2003 gender affirmation surgery,pt was dilating her neovagina but reports it is not deep as it used to be  Hx of CAD with 10 cardiac stents , 8 are in LAD, 2  Walk 10 min at lunch  Did not cardiac rehab   [place] Lymphoma with chemo Tx 6 years ago. No changes to skin tissue at pelvic floor.    Fitness routine consists of 10 min walk during lunch break  Sleep cycle interrupted: Goes to bed at 8pm , gets up at midnight to assist with spouse with hoyer lift  from [facility]. Which pt has had to do for the  past 8 years   Charchot-Marie-tooth disease.  She is IND with bathroom. 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 One of her patient room, she turns R and the other room L.      PERTINENT HISTORY:  See above  PAIN:  Are you having pain? No   PRECAUTIONS: No   WEIGHT BEARING RESTRICTIONS: No   FALLS:  Has patient fallen in last 6 months? No  LIVING ENVIRONMENT:  Lives with: spouse Lives in: 2 story , ramp to enter front door  Stairs:  2 flights  , 16 steps total  Has following equipment at home: for spouse uses a Nurse, adult    OCCUPATION: physician   PLOF: IND   PATIENT GOALS:  "I do not want any accidents" . Miralax and fiber has improved that.  Pt os making the effort at 6am - 6:30am for bowel movement without straining.  "Keep rectal prolapse within me and not having to check on it every hour"    OBJECTIVE:                                                   [facility] PT Assessment -       Squat   Comments poor technique/ alignment with  knees more anterior , COM on posterior COM      Lunges   Comments backward lunges with  minor cues    Functional activity:     simulated moving spouse's hoyer lift every night but did not demo use of LKC , more straining onto pelvic floor through upper body use          Pelvic Floor Special Questions -     External Perineal Exam without garments, standing, cued to cough/ bear down: no prolapse observed,  in sidelying, pt able to find slight anterior tilt of pelvis without cues:, quick contraction with upward movement of pelvic floor/ proper lengthening with inhalation             [facility] Adult PT Treatment/Exercise -       Self-Care   Other Self-Care Comments  assessed goals and discussed d/c      Neuro Re-ed    Neuro Re-ed Details  cued for minisquat and ski track / scaption HEP to further promote anterior tilt of pelvis and strenghten gluts   Cued for quick kegel in quadriped to help with antigravity position for rectal sphincter activation   Therapeutic ACtivity:  cued for more LKC use in semi tandem stance with pushing and pull hoyer lift,.             HOME EXERCISE PROGRAM: See pt instruction section    ASSESSMENT:  CLINICAL IMPRESSION:  Pt met 7/7 goals and ready for d/c today as pt reported needing to end PT due to insurance changes  Improvements include:     Pt reports not needing to check on prolapse / fecal leakage every hour, only every two hours now.   Pt had only one fecal leakage the past week compared to daily.  No more straining with bowel movements  more  upright posture, less forward head, thoracic kyphosis which helped IAP  system to minimize prolapse and also promote cardiovascular health and endurnace given Hx of cardiac surgery because pt showed less out of breath when walking long distances, needing less sitting breaks when shopping   Able to perform quick kegels with deep core co-activation               Added more glut strengthening in upright position to help with anterior tilt of pelvis and quick  kegel in quadriped  position to promote antigravity position for rectal sphincter activation. Educated and cued for more LKC use in semi tandem stance with pushing and pull hoyer lift, which pt uses every night to assist with spouse. Anticipate these body mechanics will help             minimize straining pelvic floor, minimizing prolapse.   Did not get to progress to endurance contractions kegels due to pt needing to end PT due to insurance changes.   Pt is d/c today     OBJECTIVE IMPAIRMENTS decreased activity tolerance, decreased coordination, decreased endurance, decreased mobility, difficulty walking, decreased ROM, decreased strength, decreased safety awareness, hypomobility, increased muscle spasms, impaired flexibility, improper body mechanics, postural dysfunction,   ACTIVITY LIMITATIONS  self-care,  sleep, home chores, work tasks    PARTICIPATION LIMITATIONS:  community    PERSONAL FACTORS   interrupted sleep, low water  intake, high soda intake, posture are also affecting patient's functional outcome.    REHAB POTENTIAL: Good   CLINICAL DECISION MAKING:  High complexity   EVALUATION COMPLEXITY: High    PATIENT EDUCATION:    Education details: Showed pt anatomy images. Explained muscles attachments/ connection, physiology of deep core system/ spinal- thoracic-pelvis-lower kinetic chain as they relate to pt's presentation, Sx, and past Hx. Explained what and how these areas of deficits need to be restored to balance and function    See Therapeutic activity / neuromuscular re-education section  Answered pt's questions.   Person educated: Patient Education method: Explanation, Demonstration, Tactile cues, Verbal cues, and Handouts Education comprehension: verbalized understanding, returned demonstration, verbal cues required, tactile cues required, and needs further education     PLAN: PT FREQUENCY: 1x/week   PT DURATION: 10 weeks   PLANNED INTERVENTIONS: Therapeutic exercises,  Therapeutic activity, Neuromuscular re-education, Balance training, Gait training, Patient/Family education, Self Care, Joint mobilization, Spinal mobilization, Moist heat, Taping, and Manual therapy, dry needling.   PLAN FOR NEXT SESSION: See clinical impression for plan     GOALS: Goals reviewed with patient? Yes  SHORT TERM GOALS: Target date: 01/19/2024    Pt will demo IND with HEP                    Baseline: Not IND            Goal status: INITIAL   LONG TERM GOALS: Target date: 03/01/2024    1.Pt will demo proper deep core coordination without chest breathing and optimal excursion of diaphragm/pelvic floor in order to promote spinal stability and pelvic floor function  Baseline: dyscoordination Goal status: MET   2.  Pt will demo proper body mechanics in against gravity tasks and ADLs ,  work tasks, fitness  to minimize straining pelvic floor / back    Baseline: not IND, improper form that places strain on pelvic floor  Goal status: MET   3. Pt will demo reciprocal gait pattern, longer stride length  in order to ambulate safely in community and having a improved walking routine  Baseline:  decreased R stance, minimal  L posterior thoracic rotation Goal status: MET     4.  Pt will report decreased manually assisting to place rectal prolapse to once every 2 hours or decreased frequency by 50% per day  Baseline:  Pt is manually assisting to place rectal prolapse every hour ,  ( 02/09/24: only once every two hours)  Goal status: MET    5. Pt will be compliant with increased water  intake and decreased soda, ice tea to promote bladder and bladder health  Baseline:  Daily fluid intake: 8-12 fl oz water , diet coke 80 fl oz, 8 fl oz ice tea.  Goal status:  NOT MET , ( 02/09/24:  still no water , switched from tea to lemonade )   6. Pt will report no more straining with BMs  > 75% of the time  in order to minimize worsening prolapse  Baseline: Initially need to strain with each  bowel movement , pt has to stretch.  Pt is scheduling and asking herself to have a BM in the morning. It takes 15 min. Goal status: MET ( 02/09/24: No more straining)   7. Pt will demo improved score by > 2 pt change with [place] Sleep Disorder Index to improve sleep hygiene , improve immune functions as a cancer survivor  Baseline:  plan to assess  Goal Status : DEFERRED    8. Pt will demo improved upright posture with decreased distance from earlobe to wall , demo less thoracic kyphosis and will be compliant with HEP for strenghtening deep core and posterior deep musculature for cervico-scapulo-thoracic-lumbar -glut LKC chain in order to promote cardiovascular function ( Hx of stents, CAD)  and promote improved sleep positioning  Baseline: earlobe to wall, 23cm to wall , 15 cm heel to wall  Goal Status  MET 20 cm from wall, more slacked posture, less forward head, thoracic kyphosis   9. Pt will improve distance on > 1000 ft to improve her walking routine of 10 min with improved cardio function, build core mm with maintaining walking routine  Baseline:   plan to assess  , Perceived Rate of Exertion score  Goal Status :DEFERRED    [place] Andreas, PT 02/09/2024, 1:39 PM

## 2024-02-14 ENCOUNTER — Other Ambulatory Visit: Payer: Self-pay

## 2024-02-15 ENCOUNTER — Other Ambulatory Visit: Payer: Self-pay

## 2024-02-15 NOTE — Progress Notes (Signed)
 Specialty Pharmacy Refill Coordination Note  Kendra Salas is a 73 y.o. adult contacted today regarding refills of specialty medication(s) Dupilumab  (Dupixent )   Patient requested (Patient-Rptd) Delivery   Delivery date: (Patient-Rptd) 02/23/24   Verified address: (Patient-Rptd) [address] [place] [address]   Medication will be filled on 02/22/24.

## 2024-02-16 ENCOUNTER — Ambulatory Visit: Admitting: Physical Therapy

## 2024-02-18 ENCOUNTER — Other Ambulatory Visit: Payer: Self-pay

## 2024-02-22 ENCOUNTER — Other Ambulatory Visit: Payer: Self-pay

## 2024-03-01 ENCOUNTER — Ambulatory Visit: Payer: Commercial Managed Care - PPO | Admitting: Internal Medicine

## 2024-03-01 ENCOUNTER — Other Ambulatory Visit: Payer: Self-pay

## 2024-03-02 ENCOUNTER — Other Ambulatory Visit: Payer: Self-pay

## 2024-03-02 MED FILL — Clopidogrel Bisulfate Tab 75 MG (Base Equiv): ORAL | 90 days supply | Qty: 90 | Fill #1 | Status: AC

## 2024-03-03 ENCOUNTER — Other Ambulatory Visit: Payer: Self-pay

## 2024-03-06 ENCOUNTER — Encounter: Admitting: Physical Therapy

## 2024-03-08 ENCOUNTER — Encounter: Admitting: Physical Therapy

## 2024-03-15 ENCOUNTER — Encounter: Admitting: Physical Therapy

## 2024-03-17 ENCOUNTER — Other Ambulatory Visit (HOSPITAL_COMMUNITY): Payer: Self-pay

## 2024-03-17 ENCOUNTER — Other Ambulatory Visit: Payer: Self-pay

## 2024-03-20 ENCOUNTER — Other Ambulatory Visit: Payer: Self-pay | Admitting: Pharmacy Technician

## 2024-03-20 ENCOUNTER — Other Ambulatory Visit: Payer: Self-pay

## 2024-03-20 NOTE — Progress Notes (Signed)
 Disenrolled; [facility] Looks like patient no longer have [facility] insurance. Has new insurance [employer].

## 2024-03-30 ENCOUNTER — Telehealth: Payer: Self-pay

## 2024-03-30 NOTE — Telephone Encounter (Signed)
 Patient stopped by to advise her insurance has changed and will need a PA with new insurance for Dupixent .  I advised we would work on this and gave her samples x 2 of Dupixent  lot [id number] exp 03/2026./sh

## 2024-04-03 ENCOUNTER — Other Ambulatory Visit: Payer: Self-pay

## 2024-04-03 MED ORDER — DUPIXENT 300 MG/2ML ~~LOC~~ SOSY: 300.0000 mg | PREFILLED_SYRINGE | SUBCUTANEOUS | 6 refills | Status: DC

## 2024-04-10 ENCOUNTER — Other Ambulatory Visit: Payer: Self-pay | Admitting: Family Medicine

## 2024-04-10 ENCOUNTER — Ambulatory Visit
Admission: RE | Admit: 2024-04-10 | Discharge: 2024-04-10 | Disposition: A | Discharge status: Home / Self Care | Source: Ambulatory Visit | Attending: Family Medicine | Admitting: Family Medicine

## 2024-04-10 DIAGNOSIS — Z1231 Encounter for screening mammogram for malignant neoplasm of breast: Secondary | ICD-10-CM | POA: Diagnosis present

## 2024-05-02 ENCOUNTER — Telehealth: Payer: Self-pay

## 2024-05-02 ENCOUNTER — Other Ambulatory Visit: Payer: Self-pay

## 2024-05-02 NOTE — Telephone Encounter (Signed)
 Patient came in today with complications receiving Dupixent .  Advised patient to call [facility] and let them work her RX and also contact Dupixent  [employer] to see if she is eligible for any kind of patient assistance.   Patient has not had injections for almost a month so 1 box of syringes given to patient. LOT: [id number] EXP: 08/04/2025

## 2024-05-03 ENCOUNTER — Telehealth: Payer: Self-pay

## 2024-05-03 MED ORDER — DUPIXENT 300 MG/2ML ~~LOC~~ SOSY
300.0000 mg | PREFILLED_SYRINGE | SUBCUTANEOUS | 5 refills | Status: DC
Start: 2024-05-03 — End: 2024-06-21

## 2024-05-03 NOTE — Telephone Encounter (Signed)
 Tried to call pharmacy and inform them patient no longer see's provider in our office. Number provided was a Recorded line never got to speak with a Pharm representative. Called patient and she informed me this mater had been taken care of.  JM  Copied from CRM [id number]. Topic: Clinical - Prescription Issue >> May 03, 2024 10:30 AM Avram MATSU wrote: Reason for CRM: Suzen [facility] and stated the pt dupilumab  (DUPIXENT ) 300 MG/2ML prefilled syringe [[id number]] needs to be sent over to them because there was a transfer that was not valid [facility]    [facility] [address] [place], [address] [phone] [phone]

## 2024-05-03 NOTE — Telephone Encounter (Signed)
 Patient's injections are being denied due to no documentation of BSA with 10% or greater. Documentation of great involvement was before EPIC. Advised patient we will help continue therapy through samples until September appt and can document BSA WITHOUT Dupixent  at that time. aw

## 2024-05-03 NOTE — Telephone Encounter (Signed)
 Patient left nurse voicemail to please send new RX for Dupixent  to [facility]. aw

## 2024-06-17 ENCOUNTER — Other Ambulatory Visit: Payer: Self-pay | Admitting: Internal Medicine

## 2024-06-17 ENCOUNTER — Other Ambulatory Visit: Payer: Self-pay | Admitting: Cardiology

## 2024-06-17 DIAGNOSIS — I25118 Atherosclerotic heart disease of native coronary artery with other forms of angina pectoris: Secondary | ICD-10-CM

## 2024-06-17 DIAGNOSIS — Z79890 Hormone replacement therapy (postmenopausal): Secondary | ICD-10-CM

## 2024-06-19 NOTE — Telephone Encounter (Signed)
 No longer listed as PCP Requested Prescriptions  Pending Prescriptions Disp Refills   rosuvastatin  (CRESTOR ) 40 MG tablet [Pharmacy Med Name: ROSUVASTATIN  CALCIUM  40 MG TAB] 90 tablet 1    Sig: TAKE 1 TABLET BY MOUTH EVERY DAY     Cardiovascular:  Antilipid - Statins 2 Failed - 06/19/2024  3:22 PM      Failed - Lipid Panel in normal range within the last 12 months    Cholesterol, Total  Date Value Ref Range Status  05/04/2019 125 100 - 199 mg/dL Final   Cholesterol  Date Value Ref Range Status  12/22/2022 116 0 - 200 Final   LDL Calculated  Date Value Ref Range Status  05/04/2019 66 0 - 99 mg/dL Final   LDL Cholesterol  Date Value Ref Range Status  12/22/2022 51  Final   HDL  Date Value Ref Range Status  12/22/2022 47 35 - 70 Final  05/04/2019 39 (L) >39 mg/dL Final   Triglycerides  Date Value Ref Range Status  12/22/2022 92 40 - 160 Final         Passed - Cr in normal range and within 360 days    Creatinine  Date Value Ref Range Status  10/16/2014 0.85 0.60 - 1.30 mg/dL Final   Creatinine, Ser  Date Value Ref Range Status  10/20/2023 0.96 0.44 - 1.00 mg/dL Final         Passed - Patient is not pregnant      Passed - Valid encounter within last 12 months    Recent Outpatient Visits           7 months ago Encounter for medication review   [facility] Dept Of [place]. [facility] Fleeta Tonia Garnette LITTIE, RPH-CPP   1 year ago Encounter for medication review   [place] [facility] Dept Of [place]. [facility] Fleeta Tonia Garnette LITTIE, RPH-CPP   2 years ago Encounter for medication review   [place] [facility] Dept Of [place]. [facility] Fleeta Tonia Garnette LITTIE, RPH-CPP   4 years ago Encounter for medication review   [place] [facility] [facility] Dept Of [place]. [facility] Fleeta Tonia, Garnette LITTIE, RPH-CPP       Future Appointments             In 2 days  Hester Alm BROCKS, MD [facility]             isosorbide  mononitrate (IMDUR ) 30 MG 24 hr tablet [Pharmacy Med Name: ISOSORBIDE  MONONIT ER 30 MG TB] 90 tablet 1    Sig: TAKE 1 TABLET BY MOUTH EVERY DAY     Cardiovascular:  Nitrates Passed - 06/19/2024  3:22 PM      Passed - Last BP in normal range    BP Readings from Last 1 Encounters:  01/05/24 115/66         Passed - Last Heart Rate in normal range    Pulse Readings from Last 1 Encounters:  01/05/24 (!) 54         Passed - Valid encounter within last 12 months    Recent Outpatient Visits           7 months ago Encounter for medication review   [facility] [place]. [facility] Fleeta Tonia Garnette LITTIE, RPH-CPP  1 year ago Encounter for medication review   [facility] [facility] Dept Of [place]. [facility] Fleeta Tonia Garnette LITTIE, RPH-CPP   2 years ago Encounter for medication review   [place] [facility] Dept Of [place]. [facility] Fleeta Tonia Garnette LITTIE, RPH-CPP   4 years ago Encounter for medication review   [place] [facility] [place] [facility] Dept Of [place]. [facility] Fleeta Tonia Garnette LITTIE, RPH-CPP       Future Appointments             In 2 days Hester Alm BROCKS, MD [facility]             amLODipine  (NORVASC ) 5 MG tablet [Pharmacy Med Name: AMLODIPINE  BESYLATE 5 MG TAB] 90 tablet 1    Sig: TAKE 1 TABLET BY MOUTH EVERY DAY     Cardiovascular: Calcium  Channel Blockers 2 Failed - 06/19/2024  3:22 PM      Failed - Valid encounter within last 6 months    Recent Outpatient Visits           7 months ago Encounter for medication review   [place] [facility] Dept Of [place]. [facility] Fleeta Tonia Garnette LITTIE, RPH-CPP   1 year ago Encounter for medication review   [place] Dept Of [place]. [facility]  Fleeta Tonia Garnette LITTIE, RPH-CPP   2 years ago Encounter for medication review   [place] [facility] Dept Of [place]. [facility] Fleeta Tonia Garnette LITTIE, RPH-CPP   4 years ago Encounter for medication review   [place] [facility] [facility] - [facility] Dept Of [place]. [facility] Fleeta Tonia Garnette LITTIE, RPH-CPP       Future Appointments             In 2 days Hester Alm BROCKS, MD [facility]            Passed - Last BP in normal range    BP Readings from Last 1 Encounters:  01/05/24 115/66         Passed - Last Heart Rate in normal range    Pulse Readings from Last 1 Encounters:  01/05/24 (!) 54          finasteride  (PROSCAR ) 5 MG tablet [Pharmacy Med Name: FINASTERIDE  5 MG TABLET] 90 tablet 1    Sig: TAKE 1 TABLET BY MOUTH EVERY DAY     Urology: 5-alpha Reductase Inhibitors Failed - 06/19/2024  3:22 PM      Failed - PSA in normal range and within 360 days    PSA  Date Value Ref Range Status  01/05/2022 0.1  Final   Prostate Specific Ag, Serum  Date Value Ref Range Status  05/04/2019 <0.1 Not Estab. ng/mL Final    Comment:    Roche ECLIA methodology. According to the American Urological Association, Serum PSA should decrease and remain at undetectable levels after radical prostatectomy. The AUA defines biochemical recurrence as an initial PSA value 0.2 ng/mL or greater followed by a subsequent confirmatory PSA value 0.2 ng/mL or greater. Values obtained with different assay methods or kits cannot be used interchangeably. Results cannot be interpreted as absolute evidence of the presence or absence of malignant disease.  Passed - Valid encounter within last 12 months    Recent Outpatient Visits           7 months ago Encounter for medication review   [place] [facility] Dept Of [place]. [facility] Fleeta Tonia Garnette LITTIE, RPH-CPP   1 year ago Encounter for medication review    [place] [facility] [place] [facility] - [facility] Dept Of [place]. [facility] Fleeta Tonia Garnette LITTIE, RPH-CPP   2 years ago Encounter for medication review   [place] [facility] [place] - A Dept Of [place]. [facility] Fleeta Tonia Garnette LITTIE, RPH-CPP   4 years ago Encounter for medication review    [facility] - [facility] Dept Of [place]. [facility] Fleeta Tonia Garnette LITTIE, RPH-CPP       Future Appointments             In 2 days Hester Alm BROCKS, MD [facility]

## 2024-06-21 ENCOUNTER — Ambulatory Visit (INDEPENDENT_AMBULATORY_CARE_PROVIDER_SITE_OTHER): Payer: Commercial Managed Care - PPO | Admitting: Dermatology

## 2024-06-21 ENCOUNTER — Encounter: Payer: Self-pay | Admitting: Dermatology

## 2024-06-21 DIAGNOSIS — L578 Other skin changes due to chronic exposure to nonionizing radiation: Secondary | ICD-10-CM

## 2024-06-21 DIAGNOSIS — D692 Other nonthrombocytopenic purpura: Secondary | ICD-10-CM

## 2024-06-21 DIAGNOSIS — L820 Inflamed seborrheic keratosis: Secondary | ICD-10-CM | POA: Diagnosis not present

## 2024-06-21 DIAGNOSIS — Z79899 Other long term (current) drug therapy: Secondary | ICD-10-CM

## 2024-06-21 DIAGNOSIS — L821 Other seborrheic keratosis: Secondary | ICD-10-CM

## 2024-06-21 DIAGNOSIS — D229 Melanocytic nevi, unspecified: Secondary | ICD-10-CM

## 2024-06-21 DIAGNOSIS — L2089 Other atopic dermatitis: Secondary | ICD-10-CM

## 2024-06-21 DIAGNOSIS — L814 Other melanin hyperpigmentation: Secondary | ICD-10-CM

## 2024-06-21 DIAGNOSIS — Z85828 Personal history of other malignant neoplasm of skin: Secondary | ICD-10-CM

## 2024-06-21 DIAGNOSIS — L570 Actinic keratosis: Secondary | ICD-10-CM | POA: Diagnosis not present

## 2024-06-21 DIAGNOSIS — W908XXA Exposure to other nonionizing radiation, initial encounter: Secondary | ICD-10-CM

## 2024-06-21 DIAGNOSIS — Z85820 Personal history of malignant melanoma of skin: Secondary | ICD-10-CM

## 2024-06-21 DIAGNOSIS — L209 Atopic dermatitis, unspecified: Secondary | ICD-10-CM

## 2024-06-21 DIAGNOSIS — Z1283 Encounter for screening for malignant neoplasm of skin: Secondary | ICD-10-CM | POA: Diagnosis not present

## 2024-06-21 DIAGNOSIS — Z7189 Other specified counseling: Secondary | ICD-10-CM

## 2024-06-21 DIAGNOSIS — I781 Nevus, non-neoplastic: Secondary | ICD-10-CM

## 2024-06-21 DIAGNOSIS — Z86018 Personal history of other benign neoplasm: Secondary | ICD-10-CM

## 2024-06-21 MED ORDER — DUPIXENT 300 MG/2ML ~~LOC~~ SOSY: 300.0000 mg | PREFILLED_SYRINGE | SUBCUTANEOUS | 5 refills | Status: AC

## 2024-06-21 NOTE — Patient Instructions (Addendum)
 Cont Dupixent  300mg /27ml sq injections q 2 wks Cont Opzelura  cr qd prn flares Cont TMC 0.1% cr to more severe areas prn flares, avoid f/g/a   Recommend gentle skin care.    Dupilumab  (Dupixent ) is a treatment given by injection for adults and children with moderate-to-severe atopic dermatitis. Goal is control of skin condition, not cure. It is given as 2 injections at the first dose followed by 1 injection ever 2 weeks thereafter.  Young children are dosed monthly.   Potential side effects include allergic reaction, herpes infections, injection site reactions and conjunctivitis (inflammation of the eyes).  The use of Dupixent  requires long term medication management, including periodic office visits.   Cryotherapy Aftercare  Wash gently with soap and water  everyday.   Apply Vaseline Jelly daily until healed.     Recommend daily broad spectrum sunscreen SPF 30+ to sun-exposed areas, reapply every 2 hours as needed. Call for new or changing lesions.  Staying in the shade or wearing long sleeves, sun glasses (UVA+UVB protection) and wide brim hats (4-inch brim around the entire circumference of the hat) are also recommended for sun protection.      Melanoma ABCDEs  Melanoma is the most dangerous type of skin cancer, and is the leading cause of death from skin disease.  You are more likely to develop melanoma if you: Have light-colored skin, light-colored eyes, or red or blond hair Spend a lot of time in the sun Tan regularly, either outdoors or in a tanning bed Have had blistering sunburns, especially during childhood Have a close family member who has had a melanoma Have atypical moles or large birthmarks  Early detection of melanoma is key since treatment is typically straightforward and cure rates are extremely high if we catch it early.   The first sign of melanoma is often a change in a mole or a new dark spot.  The ABCDE system is a way of remembering the signs of melanoma.  A  for asymmetry:  The two halves do not match. B for border:  The edges of the growth are irregular. C for color:  A mixture of colors are present instead of an even brown color. D for diameter:  Melanomas are usually (but not always) greater than 6mm - the size of a pencil eraser. E for evolution:  The spot keeps changing in size, shape, and color.  Please check your skin once per month between visits. You can use a small mirror in front and a large mirror behind you to keep an eye on the back side or your body.   If you see any new or changing lesions before your next follow-up, please call to schedule a visit.  Please continue daily skin protection including broad spectrum sunscreen SPF 30+ to sun-exposed areas, reapplying every 2 hours as needed when you're outdoors.   Staying in the shade or wearing long sleeves, sun glasses (UVA+UVB protection) and wide brim hats (4-inch brim around the entire circumference of the hat) are also recommended for sun protection.      Due to recent changes in healthcare laws, you may see results of your pathology and/or laboratory studies on MyChart before the doctors have had a chance to review them. We understand that in some cases there may be results that are confusing or concerning to you. Please understand that not all results are received at the same time and often the doctors may need to interpret multiple results in order to provide you with the  best plan of care or course of treatment. Therefore, we ask that you please give us  2 business days to thoroughly review all your results before contacting the office for clarification. Should we see a critical lab result, you will be contacted sooner.   If You Need Anything After Your Visit  If you have any questions or concerns for your doctor, please call our main line at [phone] and press option 4 to reach your doctor's medical assistant. If no one answers, please leave a voicemail as directed and we  will return your call as soon as possible. Messages left after 4 pm will be answered the following business day.   You may also send us  a message via MyChart. We typically respond to MyChart messages within 1-2 business days.  For prescription refills, please ask your pharmacy to contact our office. Our fax number is [phone].  If you have an urgent issue when the clinic is closed that cannot wait until the next business day, you can page your doctor at the number below.    Please note that while we do our best to be available for urgent issues outside of office hours, we are not available 24/7.   If you have an urgent issue and are unable to reach us , you may choose to seek medical care at your doctor's office, retail clinic, urgent care center, or emergency room.  If you have a medical emergency, please immediately call 911 or go to the emergency department.  Pager Numbers  - Dr. Hester: [phone]  - Dr. Jackquline: [phone]  - Dr. Claudene: [phone]   - Dr. Raymund: [phone]  In the event of inclement weather, please call our main line at [phone] for an update on the status of any delays or closures.  Dermatology Medication Tips: Please keep the boxes that topical medications come in in order to help keep track of the instructions about where and how to use these. Pharmacies typically print the medication instructions only on the boxes and not directly on the medication tubes.   If your medication is too expensive, please contact our office at [phone] option 4 or send us  a message through MyChart.   We are unable to tell what your co-pay for medications will be in advance as this is different depending on your insurance coverage. However, we may be able to find a substitute medication at lower cost or fill out paperwork to get insurance to cover a needed medication.   If a prior authorization is required to get your medication covered by your insurance  company, please allow us  1-2 business days to complete this process.  Drug prices often vary depending on where the prescription is filled and some pharmacies may offer cheaper prices.  The website www.goodrx.com contains coupons for medications through different pharmacies. The prices here do not account for what the cost may be with help from insurance (it may be cheaper with your insurance), but the website can give you the price if you did not use any insurance.  - You can print the associated coupon and take it with your prescription to the pharmacy.  - You may also stop by our office during regular business hours and pick up a GoodRx coupon card.  - If you need your prescription sent electronically to a different pharmacy, notify our office through [facility] or by phone at [phone] option 4.     Si Usted Necesita Algo Despus de Su Visita  Tambin puede enviarnos  un mensaje a travs de MyChart. Por lo general respondemos a los mensajes de MyChart en el transcurso de 1 a 2 das hbiles.  Para renovar recetas, por favor pida a su farmacia que se ponga en contacto con nuestra oficina. Randi lakes de fax es [place] [phone].  Si tiene un asunto urgente cuando la clnica est cerrada y que no puede esperar hasta el siguiente da hbil, puede llamar/localizar a su doctor(a) al nmero que aparece a continuacin.   Por favor, tenga en cuenta que aunque hacemos todo lo posible para estar disponibles para asuntos urgentes fuera del horario de [place], no estamos disponibles las 24 horas del da, los 7 [address]  [address] de la [place].   Si tiene un problema urgente y no puede comunicarse con nosotros, puede optar por buscar atencin mdica  en el consultorio de su doctor(a), en una clnica privada, en un centro de atencin urgente o en una sala de emergencias.  Si tiene Engineer, drilling, por favor llame inmediatamente al 911 o vaya a la sala de emergencias.  Nmeros de bper  - Dr.  Hester: [phone]  - Dra. Jackquline: [phone]  - Dr. Claudene: [phone]  - Dra. Kitts: [phone]  En caso de inclemencias del [place], por favor llame a nuestra lnea principal al [phone] para una actualizacin sobre el estado de cualquier retraso o cierre.  Consejos para la medicacin en dermatologa: Por favor, guarde las cajas en las que vienen los medicamentos de uso tpico para ayudarle a seguir las instrucciones sobre dnde y cmo usarlos. Las farmacias generalmente imprimen las instrucciones del medicamento slo en las cajas y no directamente en los tubos del [place].   Si su medicamento es muy caro, por favor, pngase en contacto con landry rieger llamando al [phone] y presione la opcin 4 o envenos un mensaje a travs de Clinical cytogeneticist.   No podemos decirle cul ser su copago por los medicamentos por adelantado ya que esto es diferente dependiendo de la cobertura de su seguro. Sin embargo, es posible que podamos encontrar un medicamento sustituto a Audiological scientist un formulario para que el seguro cubra el medicamento que se considera necesario.   Si se requiere una autorizacin previa para que su compaa de seguros malta su medicamento, por favor permtanos de 1 a 2 das hbiles para completar este proceso.  Los precios de los medicamentos varan con frecuencia dependiendo del Environmental consultant de dnde se surte la receta y alguna farmacias pueden ofrecer precios ms baratos.  El sitio web www.goodrx.com tiene cupones para medicamentos de Health and safety inspector. Los precios aqu no tienen en cuenta lo que podra costar con la ayuda del seguro (puede ser ms barato con su seguro), pero el sitio web puede darle el precio si no utiliz Tourist information centre manager.  - Puede imprimir el cupn correspondiente y llevarlo con su receta a la farmacia.  - Tambin puede pasar por nuestra oficina durante el horario de atencin regular y Education officer, museum una tarjeta de cupones de GoodRx.  - Si necesita que  su receta se enve electrnicamente a una farmacia diferente, informe a nuestra oficina a travs de MyChart de [place] o por telfono llamando al [phone] y presione la opcin 4.

## 2024-06-21 NOTE — Progress Notes (Signed)
 Follow-Up Visit   Subjective  Kendra Salas is a 73 y.o. adult who presents for the following: Skin Cancer Screening and Full Body Skin Exam. Hx of dysplastic nevi. Hx of BCC. Hx of MIS.   Atopic dermatitis. On Dupixent . Controlled well. Recent flare due to running out of medication. Patient states Dupixent  has been approved by [facility].   The patient presents for Total-Body Skin Exam (TBSE) for skin cancer screening and mole check. The patient has spots, moles and lesions to be evaluated, some may be new or changing and the patient may have concern these could be cancer.  The following portions of the chart were reviewed this encounter and updated as appropriate: medications, allergies, medical history  Review of Systems:  No other skin or systemic complaints except as noted in HPI or Assessment and Plan.  Objective  Well appearing patient in no apparent distress; mood and affect are within normal limits.  A full examination was performed including scalp, head, eyes, ears, nose, lips, neck, chest, axillae, abdomen, back, buttocks, bilateral upper extremities, bilateral lower extremities, hands, feet, fingers, toes, fingernails, and toenails. All findings within normal limits unless otherwise noted below.   Relevant physical exam findings are noted in the Assessment and Plan.  Right Temple x1, L forearm x1, R lower back x1, R clavicle x2, ant. neck x2 (7) Erythematous keratotic or waxy stuck-on papule or plaque. Left Temple x1 Erythematous thin papules/macules with gritty scale.   Assessment & Plan   SKIN CANCER SCREENING PERFORMED TODAY.  History of Dysplastic Nevi Multiple see history  - No evidence of recurrence today - Recommend regular full body skin exams - Recommend daily broad spectrum sunscreen SPF 30+ to sun-exposed areas, reapply every 2 hours as needed.  - Call if any new or changing lesions are noted between office visits    History of Basal Cell Carcinoma of the  Skin Multiple see history  - No evidence of recurrence today - Recommend regular full body skin exams - Recommend daily broad spectrum sunscreen SPF 30+ to sun-exposed areas, reapply every 2 hours as needed.  - Call if any new or changing lesions are noted between office visits    History of Melanoma in Situ Right medial breast 2008 - No evidence of recurrence today - No lymphadenopathy  - Recommend regular full body skin exams - Recommend daily broad spectrum sunscreen SPF 30+ to sun-exposed areas, reapply every 2 hours as needed.  - Call if any new or changing lesions are noted between office visits    Non Non-Hodgkin lymphoma in remission  ACTINIC DAMAGE - Chronic condition, secondary to cumulative UV/sun exposure - diffuse scaly erythematous macules with underlying dyspigmentation - Recommend daily broad spectrum sunscreen SPF 30+ to sun-exposed areas, reapply every 2 hours as needed.  - Staying in the shade or wearing long sleeves, sun glasses (UVA+UVB protection) and wide brim hats (4-inch brim around the entire circumference of the hat) are also recommended for sun protection.  - Call for new or changing lesions.  LENTIGINES, SEBORRHEIC KERATOSES, HEMANGIOMAS - Benign normal skin lesions - Benign-appearing - Call for any changes  MELANOCYTIC NEVI - Tan-brown and/or pink-flesh-colored symmetric macules and papules - Benign appearing on exam today - Observation - Call clinic for new or changing moles - Recommend daily use of broad spectrum spf 30+ sunscreen to sun-exposed areas.   ATOPIC DERMATITIS Trunk, extremities, buttocks - severe Recent flare when ran out of medication; Improved today. Currently back on systemic Dupixent   treatment Exam: sacral area clear, low back clear; excoriation from recent flare at chest. 5 % BSA on treatment Chronic and persistent condition with duration or expected duration over one year. Condition is improving with treatment but not  currently at goal.  Atopic dermatitis - Severe, on Dupixent  (biologic medication).  Atopic dermatitis (eczema) is a chronic, relapsing, pruritic condition that can significantly affect quality of life. It is often associated with allergic rhinitis and/or asthma and can require treatment with topical medications, phototherapy, or in severe cases biologic medications, which require long term medication management.   Treatment Plan: Cont Dupixent  300mg /64ml sq injections q 2 wks Cont Opzelura  cr qd prn flares Cont TMC 0.1% cr to more severe areas prn flares, avoid f/g/a   Recommend gentle skin care.    Dupilumab  (Dupixent ) is a treatment given by injection for adults and children with moderate-to-severe atopic dermatitis. Goal is control of skin condition, not cure. It is given as 2 injections at the first dose followed by 1 injection ever 2 weeks thereafter.  Young children are dosed monthly.   Potential side effects include allergic reaction, herpes infections, injection site reactions and conjunctivitis (inflammation of the eyes).  The use of Dupixent  requires long term medication management, including periodic office visits.   Long term medication management.  Patient is using long term (months to years) prescription medication  to control their dermatologic condition.  These medications require periodic monitoring to evaluate for efficacy and side effects and may require periodic laboratory monitoring.   Topical steroids (such as triamcinolone , fluocinolone, fluocinonide, mometasone, clobetasol, halobetasol, betamethasone, hydrocortisone) can cause thinning and lightening of the skin if they are used for too long in the same area. Your physician has selected the right strength medicine for your problem and area affected on the body. Please use your medication only as directed by your physician to prevent side effects.  Purpura - Chronic; persistent and recurrent.  Treatable, but not curable. -  Violaceous macules and patches - Benign - Related to trauma, age, sun damage and/or use of blood thinners, chronic use of topical and/or oral steroids - Observe - Can use OTC arnica containing moisturizer such as Dermend Bruise Formula if desired - Call for worsening or other concerns  VENOUS LAKE Exam: red or purple papule at right infraorbital Dermatoscopic exam consistent with benign venous lake. Treatment Plan: Benign-appearing. Observe    INFLAMED SEBORRHEIC KERATOSIS (7) Right Temple x1, L forearm x1, R lower back x1, R clavicle x2, ant. neck x2 (7) Symptomatic, irritating, patient would like treated. Destruction of lesion - Right Temple x1, L forearm x1, R lower back x1, R clavicle x2, ant. neck x2 (7) Complexity: simple   Destruction method: cryotherapy   Informed consent: discussed and consent obtained   Timeout:  patient name, date of birth, surgical site, and procedure verified Lesion destroyed using liquid nitrogen: Yes   Region frozen until ice ball extended beyond lesion: Yes   Outcome: patient tolerated procedure well with no complications   Post-procedure details: wound care instructions given   Additional details:  Prior to procedure, discussed risks of blister formation, small wound, skin dyspigmentation, or rare scar following cryotherapy. Recommend Vaseline ointment to treated areas while healing.   AK (ACTINIC KERATOSIS) Left Temple x1 Actinic keratoses are precancerous spots that appear secondary to cumulative UV radiation exposure/sun exposure over time. They are chronic with expected duration over 1 year. A portion of actinic keratoses will progress to squamous cell carcinoma of the skin. It  is not possible to reliably predict which spots will progress to skin cancer and so treatment is recommended to prevent development of skin cancer.  Recommend daily broad spectrum sunscreen SPF 30+ to sun-exposed areas, reapply every 2 hours as needed.  Recommend staying  in the shade or wearing long sleeves, sun glasses (UVA+UVB protection) and wide brim hats (4-inch brim around the entire circumference of the hat). Call for new or changing lesions. Destruction of lesion - Left Temple x1 Complexity: simple   Destruction method: cryotherapy   Informed consent: discussed and consent obtained   Timeout:  patient name, date of birth, surgical site, and procedure verified Lesion destroyed using liquid nitrogen: Yes   Region frozen until ice ball extended beyond lesion: Yes   Outcome: patient tolerated procedure well with no complications   Post-procedure details: wound care instructions given   Additional details:  Prior to procedure, discussed risks of blister formation, small wound, skin dyspigmentation, or rare scar following cryotherapy. Recommend Vaseline ointment to treated areas while healing.    Return in about 1 year (around 06/21/2025) for TBSE, HxMIS, HxDN, HxBCC, Atopic Dermatitis Follow Up in 6 months.  I, Jill Parcell, CMA, am acting as scribe for Alm Rhyme, MD.   Documentation: I have reviewed the above documentation for accuracy and completeness, and I agree with the above.  Alm Rhyme, MD

## 2024-08-29 ENCOUNTER — Other Ambulatory Visit: Payer: Self-pay

## 2024-08-29 ENCOUNTER — Other Ambulatory Visit (HOSPITAL_COMMUNITY): Payer: Self-pay

## 2024-10-02 NOTE — Discharge Instructions (Signed)

## 2024-10-03 ENCOUNTER — Encounter: Payer: Self-pay | Admitting: Ophthalmology

## 2024-10-09 ENCOUNTER — Encounter
Admission: RE | Disposition: A | Discharge status: Home / Self Care | Payer: Self-pay | Source: Home / Self Care | Attending: Ophthalmology

## 2024-10-09 ENCOUNTER — Other Ambulatory Visit: Payer: Self-pay

## 2024-10-09 ENCOUNTER — Encounter: Payer: Self-pay | Admitting: Ophthalmology

## 2024-10-09 ENCOUNTER — Ambulatory Visit: Payer: Self-pay | Admitting: Anesthesiology

## 2024-10-09 ENCOUNTER — Ambulatory Visit
Admission: RE | Admit: 2024-10-09 | Discharge: 2024-10-09 | Disposition: A | Discharge status: Home / Self Care | Attending: Ophthalmology | Admitting: Ophthalmology

## 2024-10-09 DIAGNOSIS — H2511 Age-related nuclear cataract, right eye: Secondary | ICD-10-CM | POA: Diagnosis present

## 2024-10-09 HISTORY — DX: Depression, unspecified: F32.A

## 2024-10-09 HISTORY — PX: CATARACT EXTRACTION W/PHACO: SHX586

## 2024-10-09 LAB — GLUCOSE, CAPILLARY: Glucose-Capillary: 102 mg/dL — ABNORMAL HIGH (ref 70–99)

## 2024-10-09 SURGERY — PHACOEMULSIFICATION, CATARACT, WITH IOL INSERTION
Anesthesia: Monitor Anesthesia Care | Site: Eye | Laterality: Right

## 2024-10-09 MED ORDER — PHENYLEPHRINE HCL 10 % OP SOLN
1.0000 [drp] | OPHTHALMIC | Status: AC | PRN
  Administered 2024-10-09 (×3): 1 [drp] via OPHTHALMIC

## 2024-10-09 MED ORDER — CYCLOPENTOLATE HCL 2 % OP SOLN
1.0000 [drp] | OPHTHALMIC | Status: DC | PRN
  Administered 2024-10-09 (×2): 1 [drp] via OPHTHALMIC

## 2024-10-09 MED ORDER — MIDAZOLAM HCL (PF) 2 MG/2ML IJ SOLN
INTRAMUSCULAR | Status: DC | PRN
  Administered 2024-10-09: 1 mg via INTRAVENOUS

## 2024-10-09 MED ORDER — TETRACAINE HCL 0.5 % OP SOLN
OPHTHALMIC | Status: AC
  Filled 2024-10-09: qty 4

## 2024-10-09 MED ORDER — FENTANYL CITRATE (PF) 100 MCG/2ML IJ SOLN
INTRAMUSCULAR | Status: AC
  Filled 2024-10-09: qty 2

## 2024-10-09 MED ORDER — GLYCOPYRROLATE 0.2 MG/ML IJ SOLN
INTRAMUSCULAR | Status: AC
  Filled 2024-10-09: qty 1

## 2024-10-09 MED ORDER — MIDAZOLAM HCL 2 MG/2ML IJ SOLN
INTRAMUSCULAR | Status: AC
  Filled 2024-10-09: qty 2

## 2024-10-09 MED ORDER — FENTANYL CITRATE (PF) 100 MCG/2ML IJ SOLN
INTRAMUSCULAR | Status: DC | PRN
  Administered 2024-10-09: 50 ug via INTRAVENOUS

## 2024-10-09 MED ORDER — TETRACAINE HCL 0.5 % OP SOLN
1.0000 [drp] | OPHTHALMIC | Status: DC | PRN
  Administered 2024-10-09 (×4): 1 [drp] via OPHTHALMIC

## 2024-10-09 MED ORDER — PHENYLEPHRINE HCL 10 % OP SOLN
OPHTHALMIC | Status: AC
  Filled 2024-10-09: qty 5

## 2024-10-09 MED ORDER — LIDOCAINE HCL (PF) 2 % IJ SOLN
INTRAOCULAR | Status: DC | PRN
  Administered 2024-10-09: 4 mL via INTRAOCULAR

## 2024-10-09 MED ORDER — SIGHTPATH DOSE#1 BSS IO SOLN
INTRAOCULAR | Status: DC | PRN
  Administered 2024-10-09: 15 mL via INTRAOCULAR

## 2024-10-09 MED ORDER — CYCLOPENTOLATE HCL 2 % OP SOLN
OPHTHALMIC | Status: AC
  Filled 2024-10-09: qty 2

## 2024-10-09 MED ORDER — SIGHTPATH DOSE#1 NA HYALUR & NA CHOND-NA HYALUR IO KIT
PACK | INTRAOCULAR | Status: DC | PRN
  Administered 2024-10-09: 1 via OPHTHALMIC

## 2024-10-09 MED ORDER — GLYCOPYRROLATE 0.2 MG/ML IJ SOLN
0.2000 mg | Freq: Once | INTRAMUSCULAR | Status: AC
  Administered 2024-10-09: 0.2 mg via INTRAVENOUS

## 2024-10-09 MED ORDER — SIGHTPATH DOSE#1 BSS IO SOLN
INTRAOCULAR | Status: DC | PRN
  Administered 2024-10-09: 124 mL via OPHTHALMIC

## 2024-10-09 MED ORDER — MOXIFLOXACIN HCL 0.5 % OP SOLN
OPHTHALMIC | Status: DC | PRN
  Administered 2024-10-09: .2 mL via OPHTHALMIC

## 2024-10-09 SURGICAL SUPPLY — 8 items
DISSECTOR HYDRO NUCLEUS 50X22 (MISCELLANEOUS) ×1 IMPLANT
FEE CATARACT SUITE SIGHTPATH (MISCELLANEOUS) ×1 IMPLANT
GLOVE PI ULTRA LF STRL 7.5 (GLOVE) ×1 IMPLANT
GLOVE SURG SYN 6.5 PF PI BL (GLOVE) ×1 IMPLANT
GLOVE SURG SYN 8.5 PF PI BL (GLOVE) ×1 IMPLANT
LENS IOL CLAREON PANOPTIX PRO BLF AUTON 19.5 IMPLANT
NEEDLE FILTER BLUNT 18X1 1/2 (NEEDLE) ×1 IMPLANT
SYR 3ML LL SCALE MARK (SYRINGE) ×1 IMPLANT

## 2024-10-09 NOTE — Transfer of Care (Signed)
 Immediate Anesthesia Transfer of Care Note  Patient: Kendra Salas  Procedure(s) Performed: PHACOEMULSIFICATION, CATARACT, WITH IOL INSERTION 9.13 00:50.4 (Right: Eye)  Patient Location: PACU  Anesthesia Type: MAC  Level of Consciousness: awake, alert  and patient cooperative  Airway and Oxygen Therapy: Patient Spontanous Breathing   Post-op Assessment: Post-op Vital signs reviewed, Patient's Cardiovascular Status Stable, Respiratory Function Stable, Patent Airway and No signs of Nausea or vomiting  Post-op Vital Signs: Reviewed and stable  Complications: No notable events documented.

## 2024-10-09 NOTE — H&P (Signed)
 [facility]   Primary Care Physician:  Jeffie Cheryl BRAVO, MD Ophthalmologist: Dr. Adine Novak  Pre-Procedure History & Physical: HPI:  Kendra Salas is a 74 y.o. adult here for cataract surgery.   Past Medical History:  Diagnosis Date   Anxiety    Basal cell carcinoma 03/07/2008   left dorsum mid forearm   Basal cell carcinoma 04/19/2013   left distal lat ant thigh   Basal cell carcinoma 04/11/2014   right forearm, right post shoulder near deltoid   Basal cell carcinoma 10/12/2018   left mid back 7.0 cm lat to spine   Basal cell carcinoma 06/16/2023   R forehead, ED&C   CAD (coronary artery disease)    Clotting disorder    Depression    Dysplastic nevus 02/02/2007   left volar mid to prox forearm   Dysplastic nevus 07/09/2010   right medial breast 1.5 cm med to areola, right med breast parasternal, right post distal deltoid   Eczema    GERD (gastroesophageal reflux disease)    Hyperlipemia    Hypertension    Lymphoma (HCC) 2015   Melanoma in situ (HCC) 12/22/2006   right medial breast, med sup quad   Personal history of chemotherapy 2017   6 months of chemo    Past Surgical History:  Procedure Laterality Date   AUGMENTATION MAMMAPLASTY Bilateral 2003   CARDIAC CATHETERIZATION     X3 - has 9 stents. 4 LAD, 1 posterior, Most recent cath 03/2021   COLONOSCOPY  06/29/2012   repeat 10 yrs - Dr. Ora   COLONOSCOPY WITH PROPOFOL  N/A 08/07/2022   Procedure: COLONOSCOPY WITH PROPOFOL ;  Surgeon: Jinny Carmine, MD;  Location: [facility];  Service: Endoscopy;  Laterality: N/A;  needs to be first case   CORONARY IMAGING/OCT N/A 07/07/2023   Procedure: CORONARY IMAGING/OCT;  Surgeon: Darron Deatrice LABOR, MD;  Location: [facility];  Service: Cardiovascular;  Laterality: N/A;   CORONARY LITHOTRIPSY N/A 07/07/2023   Procedure: CORONARY LITHOTRIPSY;  Surgeon: Darron Deatrice LABOR, MD;  Location: [facility];  Service: Cardiovascular;  Laterality: N/A;    CORONARY LITHOTRIPSY N/A 10/20/2023   Procedure: CORONARY LITHOTRIPSY;  Surgeon: Darron Deatrice LABOR, MD;  Location: [facility];  Service: Cardiovascular;  Laterality: N/A;   CORONARY STENT INTERVENTION N/A 07/07/2023   Procedure: CORONARY STENT INTERVENTION;  Surgeon: Darron Deatrice LABOR, MD;  Location: [facility];  Service: Cardiovascular;  Laterality: N/A;   CORONARY STENT INTERVENTION N/A 10/20/2023   Procedure: CORONARY STENT INTERVENTION;  Surgeon: Darron Deatrice LABOR, MD;  Location: [facility];  Service: Cardiovascular;  Laterality: N/A;   CORONARY ULTRASOUND/IVUS N/A 10/20/2023   Procedure: Coronary Ultrasound/IVUS;  Surgeon: Darron Deatrice LABOR, MD;  Location: [facility];  Service: Cardiovascular;  Laterality: N/A;   ESOPHAGOGASTRODUODENOSCOPY (EGD) WITH PROPOFOL  N/A 11/22/2015   Procedure: ESOPHAGOGASTRODUODENOSCOPY (EGD) WITH PROPOFOL ;  Surgeon: Carmine Jinny, MD;  Location: [facility];  Service: Endoscopy;  Laterality: N/A;   FACIAL COSMETIC SURGERY     gender re-affirmation     HERNIA REPAIR     x2   POLYPECTOMY  08/07/2022   Procedure: POLYPECTOMY;  Surgeon: Jinny Carmine, MD;  Location: [facility];  Service: Endoscopy;;   PORTA CATH REMOVAL N/A 04/10/2019   Procedure: PORTA CATH REMOVAL;  Surgeon: Marea Selinda RAMAN, MD;  Location: [facility];  Service: Cardiovascular;  Laterality: N/A;   RIGHT/LEFT HEART CATH AND CORONARY ANGIOGRAPHY Bilateral 06/18/2023  Procedure: RIGHT/LEFT HEART CATH AND CORONARY ANGIOGRAPHY;  Surgeon: Darron Deatrice LABOR, MD;  Location: [facility];  Service: Cardiovascular;  Laterality: Bilateral;    Prior to Admission medications  Medication Sig Start Date End Date Taking? Authorizing Provider  amLODipine  (NORVASC ) 5 MG tablet Take 1 tablet (5 mg total) by mouth daily. 12/07/23  Yes Justus Leita DEL, MD  aspirin  EC 81 MG tablet Take 81 mg by mouth in the morning.   Yes [provider]  clopidogrel   (PLAVIX ) 75 MG tablet Take 1 tablet (75 mg total) by mouth daily. OFFICE VISIT NEEDED FOR ADDITIONAL REFILLS 12/07/23  Yes Justus Leita DEL, MD  dupilumab  (DUPIXENT ) 300 MG/2ML prefilled syringe Inject 300 mg into the skin every 14 (fourteen) days. Starting at day 15 for maintenance. 06/21/24  Yes Hester Alm BROCKS, MD  estradiol  (ESTRACE ) 2 MG tablet Take 1 tablet (2 mg total) by mouth daily. 02/08/24  Yes   finasteride  (PROSCAR ) 5 MG tablet Take 1 tablet (5 mg total) by mouth daily. 12/07/23 12/06/24 Yes Justus Leita DEL, MD  hyoscyamine  (ANASPAZ ) 0.125 MG TBDP disintergrating tablet Place 1 tablet (0.125 mg total) under the tongue every 6 (six) hours as needed. 03/21/19  Yes Justus Leita DEL, MD  isosorbide  mononitrate (IMDUR ) 30 MG 24 hr tablet Take 1 tablet (30 mg total) by mouth daily. 12/07/23  Yes Justus Leita DEL, MD  metoprolol  succinate (TOPROL -XL) 25 MG 24 hr tablet TAKE 1 TABLET BY MOUTH EVERY DAY 06/19/24  Yes Agbor-Etang, Redell, MD  mupirocin  ointment (BACTROBAN ) 2 % Apply 1 Application topically 2 (two) times daily. Patient taking differently: Apply 1 Application topically 2 (two) times daily as needed (nasal flares). 09/06/23  Yes Justus Leita DEL, MD  rosuvastatin  (CRESTOR ) 40 MG tablet Take 1 tablet (40 mg total) by mouth daily. 12/07/23  Yes Justus Leita DEL, MD  sertraline  (ZOLOFT ) 25 MG tablet Take 1 tablet (25 mg total) by mouth daily. Take with 50mg = 75mg  daily. 02/08/24  Yes   sertraline  (ZOLOFT ) 50 MG tablet Take 1 tablet (50 mg total) by mouth daily. Take with 25mg =75mg  daily. 02/08/24  Yes   valsartan  (DIOVAN ) 160 MG tablet Take 1 tablet (160 mg total) by mouth daily. 04/22/23  Yes Justus Leita DEL, MD  acetaminophen  (TYLENOL ) 500 MG tablet Take 500 mg by mouth every 6 (six) hours as needed (pain.).    [provider]  nitroGLYCERIN  (NITROSTAT ) 0.4 MG SL tablet Place 1 tablet (0.4 mg total) under the tongue every 5 (five) minutes as needed for chest pain. 09/23/23 12/21/24   Justus Leita DEL, MD  pantoprazole  (PROTONIX ) 40 MG tablet Take 1 tablet (40 mg total) by mouth daily. 12/07/23 12/06/24  Justus Leita DEL, MD  Ruxolitinib  Phosphate (OPZELURA ) 1.5 % CREA Apply 1 Application topically as directed. Qd to aa eczema 12/02/22   Hester Alm BROCKS, MD  triamcinolone  cream (KENALOG ) 0.1 % Apply 1 application topically 2 (two) times daily. To rash on legs Patient taking differently: Apply 1 application  topically daily as needed (Eczema). 06/27/20   Justus Leita DEL, MD    Allergies as of 09/19/2024   (No Known Allergies)    Family History  Problem Relation Age of Onset   Heart attack Mother    Breast cancer Maternal Aunt 68    Social History   Socioeconomic History   Marital status: Married    Spouse name: Not on file   Number of children: Not on file   Years of education: Not  on file   Highest education level: Professional school degree (e.g., MD, DDS, DVM, JD)  Occupational History   Not on file  Tobacco Use   Smoking status: Never   Smokeless tobacco: Never  Vaping Use   Vaping status: Never Used  Substance and Sexual Activity   Alcohol use: Not Currently    Alcohol/week: 2.0 standard drinks of alcohol    Types: 2 Shots of liquor per week    Comment: 1 Margarita - Wed and Sat   Drug use: Never   Sexual activity: Not on file  Other Topics Concern   Not on file  Social History Narrative   Not on file   Social Drivers of Health   Tobacco Use: Low Risk (10/03/2024)   Patient History    Smoking Tobacco Use: Never    Smokeless Tobacco Use: Never    Passive Exposure: Not on file  Financial Resource Strain: Low Risk  (06/22/2024)   Received from [facility]   Overall Financial Resource Strain (CARDIA)    Difficulty of Paying Living Expenses: Not hard at all  Food Insecurity: No Food Insecurity (06/22/2024)   Received from [facility]   Epic    Within the past 12 months, you worried that your food would run  out before you got the money to buy more.: Never true    Within the past 12 months, the food you bought just didn't last and you didn't have money to get more.: Never true  Transportation Needs: No Transportation Needs (06/22/2024)   [facility]    In the past 12 months, has lack of transportation kept you from medical appointments or from getting medications?: No    Lack of Transportation (Non-Medical): No  Physical Activity: Unknown (11/03/2023)   Exercise Vital Sign    Days of Exercise per Week: 0 days    Minutes of Exercise per Session: Not on file  Stress: Stress Concern Present (11/03/2023)   [employer] - Occupational Stress Questionnaire    Feeling of Stress : Rather much  Social Connections: Moderately Isolated (11/03/2023)   Social Connection and Isolation Panel    Frequency of Communication with Friends and Family: More than three times a week    Frequency of Social Gatherings with Friends and Family: More than three times a week    Attends Religious Services: Never    Database Administrator or Organizations: No    Attends Engineer, Structural: Not on file    Marital Status: Married  Catering Manager Violence: Not At Risk (02/03/2022)   Humiliation, Afraid, Rape, and Kick questionnaire    Fear of Current or Ex-Partner: No    Emotionally Abused: No    Physically Abused: No    Sexually Abused: No  Depression (PHQ2-9): High Risk (11/03/2023)   Depression (PHQ2-9)    PHQ-2 Score: 15  Alcohol Screen: Low Risk (02/03/2022)   Alcohol Screen    Last Alcohol Screening Score (AUDIT): 4  Housing: Low Risk  (06/22/2024)   Received from [facility]   Epic    In the last 12 months, was there a time when you were not able to pay the mortgage or rent on time?: No    In the past 12 months, how many times have you moved where you were living?: 0    At any time in the past 12  months, were you  homeless or living in a shelter (including now)?: No  Utilities: Not At Risk (06/22/2024)   Received from [facility]   Epic    In the past 12 months has the electric, gas, oil, or water  company threatened to shut off services in your home?: No  Health Literacy: Not on file    Review of Systems: See HPI, otherwise negative ROS  Physical Exam: BP 114/79   Pulse 62   Temp (!) 97 F (36.1 C) (Temporal)   Resp 18   Ht 5' 11 (1.803 m)   Wt 78.5 kg   SpO2 95%   BMI 24.13 kg/m  General:   Alert, cooperative. Head:  Normocephalic and atraumatic. Respiratory:  Normal work of breathing. Cardiovascular:  NAD  Impression/Plan: Kendra Salas is here for cataract surgery.  Risks, benefits, limitations, and alternatives regarding cataract surgery have been reviewed with the patient.  Questions have been answered.  All parties agreeable.   Adine Novak, MD  10/09/2024, 8:21 AM

## 2024-10-09 NOTE — Op Note (Signed)
 OPERATIVE NOTE  Kendra Salas [id number] 10/09/2024   PREOPERATIVE DIAGNOSIS:  Nuclear sclerotic cataract right eye.  H25.11   POSTOPERATIVE DIAGNOSIS:    Nuclear sclerotic cataract right eye.     PROCEDURE:  Phacoemusification with posterior chamber intraocular lens placement of the right eye   LENS:   Implant Name Type Inv. Item Serial No. Manufacturer Lot No. LRB No. Used Action  LENS IOL CLAREON PANOPTIX PRO BLF AUTON 19.5 - [id number]  LENS IOL CLAREON PANOPTIX PRO BLF AUTON 19.5 [id number] [employer]  Right 1 Implanted       Procedures: PHACOEMULSIFICATION, CATARACT, WITH IOL INSERTION 9.13 00:50.4 (Right)  SURGEON:  Adine Novak, MD, MPH  ANESTHESIOLOGIST: Anesthesiologist: Leavy Ned, MD CRNA: Veronica Alm [name], CRNA   ANESTHESIA:  Topical with tetracaine  drops augmented with 1% preservative-free intracameral lidocaine .  ESTIMATED BLOOD LOSS: less than 1 mL.   COMPLICATIONS:  None.   DESCRIPTION OF PROCEDURE:  The patient was identified in the holding room and transported to the operating room and placed in the supine position under the operating microscope.  The right eye was identified as the operative eye and it was prepped and draped in the usual sterile ophthalmic fashion.  The verion system was registered without difficulty.   A 1.0 millimeter clear-corneal paracentesis was made at the 10:30 position. 0.5 ml of preservative-free 1% lidocaine  with epinephrine  was injected into the anterior chamber.  The anterior chamber was filled with viscoelastic.  A 2.4 millimeter keratome was used to make a near-clear corneal incision at the 8:00 position.  A curvilinear capsulorrhexis was made with a cystotome and capsulorrhexis forceps.  Balanced salt solution was used to hydrodissect and hydrodelineate the nucleus.   Phacoemulsification was then used in stop and chop fashion to remove the lens nucleus and epinucleus.  The remaining cortex was then removed using the  irrigation and aspiration handpiece. Viscoelastic was then placed into the capsular bag to distend it for lens placement.  A lens was then injected into the capsular bag.  The remaining viscoelastic was aspirated.  The lens was rotated with guidance from the verion system.   Wounds were hydrated with balanced salt solution.  The anterior chamber was inflated to a physiologic pressure with balanced salt solution. The lens was well centered.  Intracameral vigamox  0.1 mL undiluted was injected into the eye and a drop placed onto the ocular surface.  No wound leaks were noted.  The patient was taken to the recovery room in stable condition without complications of anesthesia or surgery  Adine Novak 10/09/2024, 9:28 AM

## 2024-10-09 NOTE — Anesthesia Preprocedure Evaluation (Signed)
 "                                  Anesthesia Evaluation  Patient identified by MRN, date of birth, ID band Patient awake    Reviewed: Allergy & Precautions, NPO status , Patient's Chart, lab work & pertinent test results  History of Anesthesia Complications Negative for: history of anesthetic complications  Airway Mallampati: II  TM Distance: >3 FB Neck ROM: Full    Dental no notable dental hx. (+) Teeth Intact   Pulmonary neg pulmonary ROS, neg sleep apnea, neg COPD, Patient abstained from smoking.Not current smoker   Pulmonary exam normal breath sounds clear to auscultation       Cardiovascular Exercise Tolerance: Good hypertension, Pt. on medications + CAD and + Cardiac Stents  (-) Past MI (-) dysrhythmias  Rhythm:Regular Rate:Normal - Systolic murmurs Multiple cardiac stents, last placed in 2022.  Stress test 2023 unremarkable   Neuro/Psych  PSYCHIATRIC DISORDERS Anxiety     negative neurological ROS     GI/Hepatic ,GERD  Controlled,,(+)     (-) substance abuse    Endo/Other  neg diabetes    Renal/GU      Musculoskeletal   Abdominal   Peds  Hematology   Anesthesia Other Findings Past Medical History: No date: Anxiety 03/07/2008: Basal cell carcinoma     Comment:  left dorsum mid forearm 04/19/2013: Basal cell carcinoma     Comment:  left distal lat ant thigh 04/11/2014: Basal cell carcinoma     Comment:  right forearm, right post shoulder near deltoid 10/12/2018: Basal cell carcinoma     Comment:  left mid back 7.0 cm lat to spine No date: CAD (coronary artery disease) No date: Clotting disorder (HCC) 02/02/2007: Dysplastic nevus     Comment:  left volar mid to prox forearm 07/09/2010: Dysplastic nevus     Comment:  right medial breast 1.5 cm med to areola, right med               breast parasternal, right post distal deltoid No date: Eczema No date: GERD (gastroesophageal reflux disease) No date: Hyperlipemia No date:  Hypertension 2015: Lymphoma (HCC) 12/22/2006: Melanoma in situ (HCC)     Comment:  right medial breast, med sup quad 2017: Personal history of chemotherapy     Comment:  6 months of chemo  Reproductive/Obstetrics Transgender patient, sex assigned at birth = female.                               Anesthesia Physical Anesthesia Plan  ASA: 3  Anesthesia Plan: MAC   Post-op Pain Management:    Induction: Intravenous  PONV Risk Score and Plan: 2 and TIVA and Midazolam   Airway Management Planned: Natural Airway and Nasal Cannula  Additional Equipment:   Intra-op Plan:   Post-operative Plan:   Informed Consent: I have reviewed the patients History and Physical, chart, labs and discussed the procedure including the risks, benefits and alternatives for the proposed anesthesia with the patient or authorized representative who has indicated his/her understanding and acceptance.     Dental Advisory Given  Plan Discussed with: Anesthesiologist, CRNA and Surgeon  Anesthesia Plan Comments: (Patient consented for risks of anesthesia including but not limited to:  - adverse reactions to medications - damage to eyes, teeth, lips or other oral mucosa - nerve damage due to  positioning  - sore throat or hoarseness - Damage to heart, brain, nerves, lungs, other parts of body or loss of life  Patient voiced understanding and assent.)        Anesthesia Quick Evaluation  "

## 2024-10-09 NOTE — Anesthesia Postprocedure Evaluation (Signed)
"   Anesthesia Post Note  Patient: Cathryne [name] Molt  Procedure(s) Performed: PHACOEMULSIFICATION, CATARACT, WITH IOL INSERTION 9.13 00:50.4 (Right: Eye)  Patient location during evaluation: PACU Anesthesia Type: MAC Level of consciousness: awake and alert Pain management: pain level controlled Vital Signs Assessment: post-procedure vital signs reviewed and stable Respiratory status: spontaneous breathing, nonlabored ventilation, respiratory function stable and patient connected to nasal cannula oxygen Cardiovascular status: blood pressure returned to baseline and stable Postop Assessment: no apparent nausea or vomiting Anesthetic complications: no   No notable events documented.   Last Vitals:  Vitals:   10/09/24 0929 10/09/24 0935  BP: 96/69 95/70  Pulse: 62 (!) 56  Resp: 15 16  Temp: (!) 36.4 C (!) 36.4 C  SpO2: 95% 94%    Last Pain:  Vitals:   10/09/24 0935  TempSrc:   PainSc: 0-No pain                 Debby Mines      "

## 2024-10-30 ENCOUNTER — Other Ambulatory Visit: Payer: Self-pay

## 2024-10-31 ENCOUNTER — Other Ambulatory Visit: Payer: Self-pay

## 2024-11-03 NOTE — Discharge Instructions (Signed)

## 2024-11-07 ENCOUNTER — Encounter: Payer: Self-pay | Admitting: Ophthalmology

## 2024-11-07 ENCOUNTER — Encounter: Payer: Self-pay | Admitting: Anesthesiology

## 2024-11-07 ENCOUNTER — Encounter: Admission: RE | Disposition: A | Payer: Self-pay | Source: Home / Self Care | Attending: Ophthalmology

## 2024-11-07 ENCOUNTER — Other Ambulatory Visit: Payer: Self-pay

## 2024-11-07 ENCOUNTER — Ambulatory Visit
Admission: RE | Admit: 2024-11-07 | Discharge: 2024-11-07 | Disposition: A | Attending: Ophthalmology | Admitting: Ophthalmology

## 2024-11-07 DIAGNOSIS — I2510 Atherosclerotic heart disease of native coronary artery without angina pectoris: Secondary | ICD-10-CM | POA: Insufficient documentation

## 2024-11-07 DIAGNOSIS — F419 Anxiety disorder, unspecified: Secondary | ICD-10-CM | POA: Insufficient documentation

## 2024-11-07 DIAGNOSIS — H2512 Age-related nuclear cataract, left eye: Secondary | ICD-10-CM | POA: Insufficient documentation

## 2024-11-07 DIAGNOSIS — I10 Essential (primary) hypertension: Secondary | ICD-10-CM | POA: Insufficient documentation

## 2024-11-07 DIAGNOSIS — K219 Gastro-esophageal reflux disease without esophagitis: Secondary | ICD-10-CM | POA: Insufficient documentation

## 2024-11-07 DIAGNOSIS — Z8249 Family history of ischemic heart disease and other diseases of the circulatory system: Secondary | ICD-10-CM | POA: Insufficient documentation

## 2024-11-07 DIAGNOSIS — F32A Depression, unspecified: Secondary | ICD-10-CM | POA: Insufficient documentation

## 2024-11-07 MED ORDER — SIGHTPATH DOSE#1 NA HYALUR & NA CHOND-NA HYALUR IO KIT
PACK | INTRAOCULAR | Status: DC | PRN
  Administered 2024-11-07: 1 via OPHTHALMIC

## 2024-11-07 MED ORDER — MIDAZOLAM HCL (PF) 2 MG/2ML IJ SOLN
INTRAMUSCULAR | Status: DC | PRN
  Administered 2024-11-07: 1 mg via INTRAVENOUS

## 2024-11-07 MED ORDER — MIDAZOLAM HCL 2 MG/2ML IJ SOLN
INTRAMUSCULAR | Status: AC
  Filled 2024-11-07: qty 2

## 2024-11-07 MED ORDER — GLYCOPYRROLATE 0.2 MG/ML IJ SOLN
INTRAMUSCULAR | Status: AC
  Filled 2024-11-07: qty 1

## 2024-11-07 MED ORDER — CYCLOPENTOLATE HCL 2 % OP SOLN
1.0000 [drp] | OPHTHALMIC | Status: DC | PRN
  Administered 2024-11-07 (×2): 1 [drp] via OPHTHALMIC

## 2024-11-07 MED ORDER — ARMC OPHTHALMIC DILATING DROPS: 1.0000 | OPHTHALMIC | Status: DC | PRN

## 2024-11-07 MED ORDER — EPHEDRINE 5 MG/ML INJ
INTRAVENOUS | Status: AC
  Filled 2024-11-07: qty 5

## 2024-11-07 MED ORDER — SIGHTPATH DOSE#1 BSS IO SOLN
INTRAOCULAR | Status: DC | PRN
  Administered 2024-11-07: 121 mL via OPHTHALMIC

## 2024-11-07 MED ORDER — FENTANYL CITRATE (PF) 100 MCG/2ML IJ SOLN
INTRAMUSCULAR | Status: DC | PRN
  Administered 2024-11-07: 50 ug via INTRAVENOUS

## 2024-11-07 MED ORDER — LIDOCAINE HCL (PF) 2 % IJ SOLN
INTRAOCULAR | Status: DC | PRN
  Administered 2024-11-07: 4 mL via INTRAOCULAR

## 2024-11-07 MED ORDER — FENTANYL CITRATE (PF) 100 MCG/2ML IJ SOLN
INTRAMUSCULAR | Status: AC
  Filled 2024-11-07: qty 2

## 2024-11-07 MED ORDER — ONDANSETRON HCL 4 MG/2ML IJ SOLN
4.0000 mg | Freq: Once | INTRAMUSCULAR | Status: AC
  Administered 2024-11-07: 4 mg via INTRAVENOUS

## 2024-11-07 MED ORDER — ONDANSETRON HCL 4 MG/2ML IJ SOLN
INTRAMUSCULAR | Status: AC
  Filled 2024-11-07: qty 2

## 2024-11-07 MED ORDER — EPHEDRINE SULFATE-NACL 50-0.9 MG/10ML-% IV SOSY
PREFILLED_SYRINGE | INTRAVENOUS | Status: DC | PRN
  Administered 2024-11-07: 10 mg via INTRAVENOUS
  Administered 2024-11-07: 5 mg via INTRAVENOUS

## 2024-11-07 MED ORDER — MOXIFLOXACIN HCL 0.5 % OP SOLN
OPHTHALMIC | Status: DC | PRN
  Administered 2024-11-07: .2 mL via OPHTHALMIC

## 2024-11-07 MED ORDER — GLYCOPYRROLATE 0.2 MG/ML IJ SOLN
INTRAMUSCULAR | Status: DC | PRN
  Administered 2024-11-07: .4 mg via INTRAVENOUS

## 2024-11-07 MED ORDER — CYCLOPENTOLATE HCL 2 % OP SOLN
OPHTHALMIC | Status: AC
  Filled 2024-11-07: qty 2

## 2024-11-07 MED ORDER — TETRACAINE HCL 0.5 % OP SOLN
1.0000 [drp] | OPHTHALMIC | Status: DC | PRN
  Administered 2024-11-07 (×3): 1 [drp] via OPHTHALMIC

## 2024-11-07 MED ORDER — PHENYLEPHRINE HCL 10 % OP SOLN
OPHTHALMIC | Status: AC
  Filled 2024-11-07: qty 5

## 2024-11-07 MED ORDER — PHENYLEPHRINE HCL 10 % OP SOLN
1.0000 [drp] | OPHTHALMIC | Status: DC | PRN
  Administered 2024-11-07 (×2): 1 [drp] via OPHTHALMIC

## 2024-11-07 MED ORDER — SIGHTPATH DOSE#1 BSS IO SOLN
INTRAOCULAR | Status: DC | PRN
  Administered 2024-11-07: 15 mL via INTRAOCULAR

## 2024-11-07 MED ORDER — TETRACAINE HCL 0.5 % OP SOLN
OPHTHALMIC | Status: AC
  Filled 2024-11-07: qty 4

## 2024-11-07 NOTE — Op Note (Signed)
 OPERATIVE NOTE  Kendra Salas [id number] 11/07/2024   PREOPERATIVE DIAGNOSIS:  Nuclear sclerotic cataract left eye.  H25.12   POSTOPERATIVE DIAGNOSIS:    Nuclear sclerotic cataract left eye.     PROCEDURE:  Phacoemusification with posterior chamber intraocular lens placement of the left eye   LENS:   Implant Name Type Inv. Item Serial No. Manufacturer Lot No. LRB No. Used Action  LENS IOL PANO PRO TORC 20.0 - [id number]  LENS IOL PANO PRO TORC 20.0 [id number] [employer]  Left 1 Implanted      Procedures: PHACOEMULSIFICATION, CATARACT, WITH IOL INSERTION 13.25 01:06.6 (Left)  SURGEON:  Adine Novak, MD, MPH   ANESTHESIA:  Topical with tetracaine  drops augmented with 1% preservative-free intracameral lidocaine .  ESTIMATED BLOOD LOSS: <1 mL   COMPLICATIONS:  None.   DESCRIPTION OF PROCEDURE:  The patient was identified in the holding room and transported to the operating room and placed in the supine position under the operating microscope.  The left eye was identified as the operative eye and it was prepped and draped in the usual sterile ophthalmic fashion.  The verion system was registered without difficulty.   A 1.0 millimeter clear-corneal paracentesis was made at the 5:00 position. 0.5 ml of preservative-free 1% lidocaine  with epinephrine  was injected into the anterior chamber.  The anterior chamber was filled with viscoelastic.  A 2.4 millimeter keratome was used to make a near-clear corneal incision at the 2:00 position.  A curvilinear capsulorrhexis was made with a cystotome and capsulorrhexis forceps.  Balanced salt solution was used to hydrodissect and hydrodelineate the nucleus.   Phacoemulsification was then used in stop and chop fashion to remove the lens nucleus and epinucleus.  The remaining cortex was then removed using the irrigation and aspiration handpiece. Viscoelastic was then placed into the capsular bag to distend it for lens placement.  A lens was then  injected into the capsular bag.  The remaining viscoelastic was aspirated.  The lens was rotated with guidance from the verion system.   Wounds were hydrated with balanced salt solution.  The anterior chamber was inflated to a physiologic pressure with balanced salt solution.  Intracameral vigamox  0.1 mL undiltued was injected into the eye and a drop placed onto the ocular surface.  No wound leaks were noted.  The patient was taken to the recovery room in stable condition without complications of anesthesia or surgery  Adine Novak 11/07/2024, 12:17 PM

## 2024-11-07 NOTE — Transfer of Care (Signed)
 Immediate Anesthesia Transfer of Care Note  Patient: Kendra Salas  Procedure(s) Performed: PHACOEMULSIFICATION, CATARACT, WITH IOL INSERTION 13.25 01:06.6 (Left: Eye)  Patient Location: PACU  Anesthesia Type: General  Level of Consciousness: awake, alert  and patient cooperative  Airway and Oxygen Therapy: Patient Spontanous Breathing and Patient connected to supplemental oxygen  Post-op Assessment: Post-op Vital signs reviewed, Patient's Cardiovascular Status Stable, Respiratory Function Stable, Patent Airway and No signs of Nausea or vomiting  Post-op Vital Signs: Reviewed and stable  Complications: No notable events documented.

## 2024-11-07 NOTE — Anesthesia Preprocedure Evaluation (Signed)
"                                    Anesthesia Evaluation  Patient identified by MRN, date of birth, ID band Patient awake    Reviewed: Allergy & Precautions, H&P , NPO status , Patient's Chart, lab work & pertinent test results  Airway Mallampati: II  TM Distance: >3 FB Neck ROM: Full    Dental no notable dental hx.    Pulmonary neg pulmonary ROS   Pulmonary exam normal breath sounds clear to auscultation       Cardiovascular hypertension, + CAD  Normal cardiovascular exam Rhythm:Regular Rate:Normal     Neuro/Psych  PSYCHIATRIC DISORDERS Anxiety Depression    negative neurological ROS     GI/Hepatic Neg liver ROS,GERD  ,,  Endo/Other  negative endocrine ROS    Renal/GU negative Renal ROS  negative genitourinary   Musculoskeletal negative musculoskeletal ROS (+)    Abdominal   Peds negative pediatric ROS (+)  Hematology negative hematology ROS (+)   Anesthesia Other Findings   Reproductive/Obstetrics negative OB ROS                              Anesthesia Physical Anesthesia Plan  ASA: 3  Anesthesia Plan: General   Post-op Pain Management:    Induction: Intravenous  PONV Risk Score and Plan:   Airway Management Planned:   Additional Equipment:   Intra-op Plan:   Post-operative Plan: Extubation in OR  Informed Consent: I have reviewed the patients History and Physical, chart, labs and discussed the procedure including the risks, benefits and alternatives for the proposed anesthesia with the patient or authorized representative who has indicated his/her understanding and acceptance.     Dental advisory given  Plan Discussed with: CRNA  Anesthesia Plan Comments:         Anesthesia Quick Evaluation  "

## 2024-11-07 NOTE — Anesthesia Postprocedure Evaluation (Signed)
"   Anesthesia Post Note  Patient: TIEISHA DARDEN  Procedure(s) Performed: PHACOEMULSIFICATION, CATARACT, WITH IOL INSERTION 13.25 01:06.6 (Left: Eye)  Patient location during evaluation: Phase II Anesthesia Type: General Level of consciousness: awake and alert Pain management: pain level controlled Vital Signs Assessment: post-procedure vital signs reviewed and stable Respiratory status: spontaneous breathing, nonlabored ventilation, respiratory function stable and patient connected to nasal cannula oxygen Cardiovascular status: stable and blood pressure returned to baseline Postop Assessment: no apparent nausea or vomiting Anesthetic complications: no   No notable events documented.   Last Vitals:  Vitals:   11/07/24 1035 11/07/24 1219  BP: 113/84 103/72  Pulse: 65 85  Resp: 20 20  Temp: (!) 36.4 C (!) 36.1 C  SpO2: 95%     Last Pain:  Vitals:   11/07/24 1219  TempSrc:   PainSc: 0-No pain                 Redell MARLA Breaker      "

## 2024-12-27 ENCOUNTER — Ambulatory Visit: Admitting: Dermatology

## 2025-06-21 ENCOUNTER — Ambulatory Visit: Admitting: Dermatology
